# Patient Record
Sex: Female | Born: 1950 | Race: White | Hispanic: No | Marital: Married | Smoking: Never smoker
Health system: Southern US, Community
[De-identification: ages and names within clinical notes are randomized; demographics above are authoritative.]

## PROBLEM LIST (undated history)

## (undated) DIAGNOSIS — R51 Headache: Secondary | ICD-10-CM

## (undated) DIAGNOSIS — K5730 Diverticulosis of large intestine without perforation or abscess without bleeding: Secondary | ICD-10-CM

## (undated) DIAGNOSIS — F329 Major depressive disorder, single episode, unspecified: Secondary | ICD-10-CM

## (undated) DIAGNOSIS — R109 Unspecified abdominal pain: Secondary | ICD-10-CM

## (undated) HISTORY — PX: COLONOSCOPY: SHX174

## (undated) HISTORY — PX: WISDOM TOOTH EXTRACTION: SHX21

## (undated) HISTORY — DX: Diverticulosis of large intestine without perforation or abscess without bleeding: K57.30

## (undated) HISTORY — PX: SPINE SURGERY: SHX786

## (undated) HISTORY — PX: EYE SURGERY: SHX253

## (undated) HISTORY — DX: Major depressive disorder, single episode, unspecified: F32.9

## (undated) HISTORY — DX: Unspecified abdominal pain: R10.9

## (undated) HISTORY — DX: Headache: R51

---

## 2002-09-01 ENCOUNTER — Other Ambulatory Visit: Admission: RE | Admit: 2002-09-01 | Discharge: 2002-09-01 | Payer: Self-pay | Admitting: Internal Medicine

## 2003-02-16 ENCOUNTER — Encounter: Payer: Self-pay | Admitting: Internal Medicine

## 2003-02-16 ENCOUNTER — Encounter: Admission: RE | Admit: 2003-02-16 | Discharge: 2003-02-16 | Payer: Self-pay | Admitting: Internal Medicine

## 2004-05-31 ENCOUNTER — Ambulatory Visit (HOSPITAL_COMMUNITY): Admission: RE | Admit: 2004-05-31 | Discharge: 2004-05-31 | Payer: Self-pay | Admitting: Internal Medicine

## 2004-11-28 ENCOUNTER — Ambulatory Visit: Payer: Self-pay | Admitting: Internal Medicine

## 2005-04-30 ENCOUNTER — Ambulatory Visit: Payer: Self-pay | Admitting: Internal Medicine

## 2005-05-22 ENCOUNTER — Ambulatory Visit: Payer: Self-pay | Admitting: Gastroenterology

## 2005-06-13 ENCOUNTER — Ambulatory Visit: Payer: Self-pay | Admitting: Gastroenterology

## 2005-06-13 ENCOUNTER — Encounter (INDEPENDENT_AMBULATORY_CARE_PROVIDER_SITE_OTHER): Payer: Self-pay | Admitting: Specialist

## 2006-02-19 ENCOUNTER — Ambulatory Visit (HOSPITAL_COMMUNITY): Admission: RE | Admit: 2006-02-19 | Discharge: 2006-02-19 | Payer: Self-pay | Admitting: Internal Medicine

## 2006-08-05 ENCOUNTER — Ambulatory Visit: Payer: Self-pay | Admitting: Internal Medicine

## 2007-01-29 ENCOUNTER — Ambulatory Visit: Payer: Self-pay | Admitting: Internal Medicine

## 2007-01-29 LAB — CONVERTED CEMR LAB
ALT: 11 units/L (ref 0–40)
AST: 18 units/L (ref 0–37)
Albumin: 4.2 g/dL (ref 3.5–5.2)
Alkaline Phosphatase: 80 units/L (ref 39–117)
BUN: 12 mg/dL (ref 6–23)
Basophils Absolute: 0 10*3/uL (ref 0.0–0.1)
Basophils Relative: 0.5 % (ref 0.0–1.0)
Bilirubin, Direct: 0.2 mg/dL (ref 0.0–0.3)
CO2: 33 meq/L — ABNORMAL HIGH (ref 19–32)
Calcium: 9.5 mg/dL (ref 8.4–10.5)
Chloride: 109 meq/L (ref 96–112)
Cholesterol: 143 mg/dL (ref 0–200)
Creatinine, Ser: 0.7 mg/dL (ref 0.4–1.2)
Eosinophils Absolute: 0.1 10*3/uL (ref 0.0–0.6)
Eosinophils Relative: 3.3 % (ref 0.0–5.0)
GFR calc Af Amer: 112 mL/min
GFR calc non Af Amer: 93 mL/min
Glucose, Bld: 107 mg/dL — ABNORMAL HIGH (ref 70–99)
HCT: 38.4 % (ref 36.0–46.0)
HDL: 57.2 mg/dL (ref >39.0)
Hemoglobin: 13.8 g/dL (ref 12.0–15.0)
LDL Cholesterol: 72 mg/dL (ref 0–99)
Lymphocytes Relative: 29.9 % (ref 12.0–46.0)
MCHC: 35.8 g/dL (ref 30.0–36.0)
MCV: 87 fL (ref 78.0–100.0)
Monocytes Absolute: 0.3 10*3/uL (ref 0.2–0.7)
Monocytes Relative: 7.3 % (ref 3.0–11.0)
Neutro Abs: 2.7 10*3/uL (ref 1.4–7.7)
Neutrophils Relative %: 59 % (ref 43.0–77.0)
Platelets: 277 10*3/uL (ref 150–400)
Potassium: 4.1 meq/L (ref 3.5–5.1)
RBC: 4.42 M/uL (ref 3.87–5.11)
RDW: 12.6 % (ref 11.5–14.6)
Sodium: 147 meq/L — ABNORMAL HIGH (ref 135–145)
TSH: 1.68 microintl units/mL (ref 0.35–5.50)
Total Bilirubin: 1.3 mg/dL — ABNORMAL HIGH (ref 0.3–1.2)
Total CHOL/HDL Ratio: 2.5
Total Protein: 6.8 g/dL (ref 6.0–8.3)
Triglycerides: 71 mg/dL (ref 0–149)
VLDL: 14 mg/dL (ref 0–40)
WBC: 4.4 10*3/uL — ABNORMAL LOW (ref 4.5–10.5)

## 2007-02-05 ENCOUNTER — Encounter: Payer: Self-pay | Admitting: Internal Medicine

## 2007-02-05 ENCOUNTER — Other Ambulatory Visit: Admission: RE | Admit: 2007-02-05 | Discharge: 2007-02-05 | Payer: Self-pay | Admitting: Internal Medicine

## 2007-02-05 ENCOUNTER — Ambulatory Visit: Payer: Self-pay | Admitting: Internal Medicine

## 2007-07-21 ENCOUNTER — Ambulatory Visit: Payer: Self-pay | Admitting: Internal Medicine

## 2007-07-21 DIAGNOSIS — J209 Acute bronchitis, unspecified: Secondary | ICD-10-CM | POA: Insufficient documentation

## 2007-07-21 DIAGNOSIS — F329 Major depressive disorder, single episode, unspecified: Secondary | ICD-10-CM

## 2007-07-21 DIAGNOSIS — F3289 Other specified depressive episodes: Secondary | ICD-10-CM

## 2007-07-21 HISTORY — DX: Major depressive disorder, single episode, unspecified: F32.9

## 2007-07-21 HISTORY — DX: Other specified depressive episodes: F32.89

## 2007-10-03 ENCOUNTER — Telehealth (INDEPENDENT_AMBULATORY_CARE_PROVIDER_SITE_OTHER): Payer: Self-pay | Admitting: *Deleted

## 2007-10-14 ENCOUNTER — Telehealth: Payer: Self-pay | Admitting: Internal Medicine

## 2007-10-14 ENCOUNTER — Encounter: Payer: Self-pay | Admitting: Internal Medicine

## 2008-02-26 ENCOUNTER — Telehealth: Payer: Self-pay | Admitting: Internal Medicine

## 2008-04-08 ENCOUNTER — Ambulatory Visit (HOSPITAL_COMMUNITY): Admission: RE | Admit: 2008-04-08 | Discharge: 2008-04-08 | Payer: Self-pay | Admitting: Internal Medicine

## 2008-07-21 ENCOUNTER — Ambulatory Visit: Payer: Self-pay | Admitting: Internal Medicine

## 2008-07-21 LAB — CONVERTED CEMR LAB
ALT: 13 units/L (ref 0–35)
AST: 20 units/L (ref 0–37)
Albumin: 4.4 g/dL (ref 3.5–5.2)
Alkaline Phosphatase: 77 units/L (ref 39–117)
BUN: 10 mg/dL (ref 6–23)
Basophils Absolute: 0 10*3/uL (ref 0.0–0.1)
Basophils Relative: 1.1 % (ref 0.0–3.0)
Bilirubin Urine: NEGATIVE
Bilirubin, Direct: 0.1 mg/dL (ref 0.0–0.3)
Blood in Urine, dipstick: NEGATIVE
CO2: 31 meq/L (ref 19–32)
Calcium: 9.2 mg/dL (ref 8.4–10.5)
Chloride: 112 meq/L (ref 96–112)
Cholesterol: 103 mg/dL (ref 0–200)
Creatinine, Ser: 0.7 mg/dL (ref 0.4–1.2)
Eosinophils Absolute: 0.1 10*3/uL (ref 0.0–0.7)
Eosinophils Relative: 3.8 % (ref 0.0–5.0)
GFR calc Af Amer: 111 mL/min
GFR calc non Af Amer: 92 mL/min
Glucose, Bld: 102 mg/dL — ABNORMAL HIGH (ref 70–99)
Glucose, Urine, Semiquant: NEGATIVE
HCT: 36.7 % (ref 36.0–46.0)
HDL: 40 mg/dL (ref >39.0)
Hemoglobin: 12.6 g/dL (ref 12.0–15.0)
LDL Cholesterol: 49 mg/dL (ref 0–99)
Lymphocytes Relative: 36.1 % (ref 12.0–46.0)
MCHC: 34.4 g/dL (ref 30.0–36.0)
MCV: 91.3 fL (ref 78.0–100.0)
Monocytes Absolute: 0.3 10*3/uL (ref 0.1–1.0)
Monocytes Relative: 7.2 % (ref 3.0–12.0)
Neutro Abs: 1.9 10*3/uL (ref 1.4–7.7)
Neutrophils Relative %: 51.8 % (ref 43.0–77.0)
Nitrite: NEGATIVE
Platelets: 253 10*3/uL (ref 150–400)
Potassium: 3.7 meq/L (ref 3.5–5.1)
RBC: 4.02 M/uL (ref 3.87–5.11)
RDW: 12.1 % (ref 11.5–14.6)
Sodium: 146 meq/L — ABNORMAL HIGH (ref 135–145)
Specific Gravity, Urine: 1.02
TSH: 1.58 microintl units/mL (ref 0.35–5.50)
Total Bilirubin: 1 mg/dL (ref 0.3–1.2)
Total CHOL/HDL Ratio: 2.6
Total Protein: 6.6 g/dL (ref 6.0–8.3)
Triglycerides: 68 mg/dL (ref 0–149)
Urobilinogen, UA: 0.2
VLDL: 14 mg/dL (ref 0–40)
WBC Urine, dipstick: NEGATIVE
WBC: 3.6 10*3/uL — ABNORMAL LOW (ref 4.5–10.5)
pH: 7

## 2008-07-26 ENCOUNTER — Other Ambulatory Visit: Admission: RE | Admit: 2008-07-26 | Discharge: 2008-07-26 | Payer: Self-pay | Admitting: Internal Medicine

## 2008-07-26 ENCOUNTER — Encounter: Payer: Self-pay | Admitting: Internal Medicine

## 2008-07-26 ENCOUNTER — Ambulatory Visit: Payer: Self-pay | Admitting: Internal Medicine

## 2008-07-26 DIAGNOSIS — K5730 Diverticulosis of large intestine without perforation or abscess without bleeding: Secondary | ICD-10-CM

## 2008-07-26 DIAGNOSIS — R51 Headache: Secondary | ICD-10-CM

## 2008-07-26 DIAGNOSIS — R519 Headache, unspecified: Secondary | ICD-10-CM | POA: Insufficient documentation

## 2008-07-26 HISTORY — DX: Diverticulosis of large intestine without perforation or abscess without bleeding: K57.30

## 2008-07-26 HISTORY — DX: Headache: R51

## 2008-07-26 LAB — HM PAP SMEAR

## 2008-08-24 ENCOUNTER — Ambulatory Visit: Payer: Self-pay | Admitting: Internal Medicine

## 2008-10-04 ENCOUNTER — Ambulatory Visit: Payer: Self-pay | Admitting: Internal Medicine

## 2008-10-04 DIAGNOSIS — J069 Acute upper respiratory infection, unspecified: Secondary | ICD-10-CM | POA: Insufficient documentation

## 2008-11-25 ENCOUNTER — Telehealth (INDEPENDENT_AMBULATORY_CARE_PROVIDER_SITE_OTHER): Payer: Self-pay

## 2009-01-06 ENCOUNTER — Telehealth: Payer: Self-pay | Admitting: Internal Medicine

## 2009-01-11 ENCOUNTER — Telehealth: Payer: Self-pay | Admitting: Internal Medicine

## 2009-06-21 ENCOUNTER — Telehealth (INDEPENDENT_AMBULATORY_CARE_PROVIDER_SITE_OTHER): Payer: Self-pay | Admitting: *Deleted

## 2009-06-23 ENCOUNTER — Ambulatory Visit: Payer: Self-pay | Admitting: Internal Medicine

## 2010-04-19 ENCOUNTER — Ambulatory Visit: Payer: Self-pay | Admitting: Internal Medicine

## 2010-05-01 ENCOUNTER — Ambulatory Visit: Payer: Self-pay | Admitting: Internal Medicine

## 2010-05-01 DIAGNOSIS — R109 Unspecified abdominal pain: Secondary | ICD-10-CM

## 2010-05-01 DIAGNOSIS — R1011 Right upper quadrant pain: Secondary | ICD-10-CM | POA: Insufficient documentation

## 2010-05-01 HISTORY — DX: Unspecified abdominal pain: R10.9

## 2010-05-05 ENCOUNTER — Ambulatory Visit (HOSPITAL_COMMUNITY): Admission: RE | Admit: 2010-05-05 | Discharge: 2010-05-05 | Payer: Self-pay | Admitting: Internal Medicine

## 2010-05-05 LAB — HM MAMMOGRAPHY: HM Mammogram: NEGATIVE

## 2010-05-08 ENCOUNTER — Telehealth: Payer: Self-pay | Admitting: Internal Medicine

## 2010-08-03 ENCOUNTER — Encounter: Payer: Self-pay | Admitting: Internal Medicine

## 2010-12-03 ENCOUNTER — Encounter: Payer: Self-pay | Admitting: Internal Medicine

## 2010-12-12 NOTE — Progress Notes (Signed)
Summary: refill  Phone Note Call from Patient Call back at Home Phone [phone]   Caller: pt live Call For: K  Summary of Call: acyclovir 200 mg [facility] # [phone] Initial call taken by: Roselle Locus,  November 25, 2008 9:09 AM  Follow-up for Phone Call        attempted to call [facility] x 3 hrs- constantly busy. Called pt- LMOM to call back if she desires [place] to call another pharmacy. Follow-up by: Raechel Ache, RN,  November 26, 2008 4:52 PM

## 2010-12-12 NOTE — Assessment & Plan Note (Signed)
Summary: CONGESTION/CCM  Medications Added LEXAPRO 10 MG TABS (ESCITALOPRAM OXALATE)  ACYCLOVIR 200 MG  CAPS (ACYCLOVIR) Take as dir as needed      Allergies Added: SULFAMETHOXAZOLE (SULFAMETHOXAZOLE)  Vital Signs:  Patient Profile:   60 Years Old Female Weight:      164 pounds Temp:     98.4 degrees F oral BP sitting:   126 / 84  (left arm)  Vitals Entered By: Raechel Ache, RN (July 21, 2007 8:34 AM)             Is Patient Diabetic? No     Chief Complaint:  C/o sinus drainage and scratchy throat. Had cold a month ago. Also worried about dementia- family hx..  History of Present Illness: 60year-old female presents with a complaint of persistent postnasal drip and drainage and cough. Her main concern is forgetfulness.  She states that both her mother and grandmother have a history of dementia. At times.  She is occasionally forgetful, but really no short-term memory issues at all.  Dementia discussed in detail  Current Allergies: SULFAMETHOXAZOLE (SULFAMETHOXAZOLE)  Past Medical History:    Depression  Past Surgical History:    wisdom teeth, extraction   Family History:    father history of coronary artery disease, prior MI    mother, hypertension, diabetes, obesity, history of dementia  Social History:    Married   Risk Factors:  Tobacco use:  never   Review of Systems  The patient denies anorexia, fever, weight loss, vision loss, decreased hearing, hoarseness, chest pain, syncope, dyspnea on exhertion, peripheral edema, prolonged cough, hemoptysis, abdominal pain, melena, hematochezia, severe indigestion/heartburn, hematuria, incontinence, genital sores, muscle weakness, suspicious skin lesions, transient blindness, difficulty walking, depression, unusual weight change, abnormal bleeding, enlarged lymph nodes, angioedema, breast masses, and testicular masses.     Physical Exam  General:     Well-developed,well-nourished,in no acute distress;  alert,appropriate and cooperative throughout examination Head:     Normocephalic and atraumatic without obvious abnormalities. No apparent alopecia or balding. Eyes:     No corneal or conjunctival inflammation noted. EOMI. Perrla. Funduscopic exam benign, without hemorrhages, exudates or papilledema. Vision grossly normal. Ears:     External ear exam shows no significant lesions or deformities.  Otoscopic examination reveals clear canals, tympanic membranes are intact bilaterally without bulging, retraction, inflammation or discharge. Hearing is grossly normal bilaterally. Mouth:     Oral mucosa and oropharynx without lesions or exudates.  Teeth in good repair. Neck:     No deformities, masses, or tenderness noted. Lungs:     Normal respiratory effort, chest expands symmetrically. Lungs are clear to auscultation, no crackles or wheezes. Heart:     Normal rate and regular rhythm. S1 and S2 normal without gallop, murmur, click, rub or other extra sounds.    Impression & Recommendations:  Problem # 1:  DEPRESSION (ICD-311)  Her updated medication list for this problem includes:    Lexapro 10 Mg Tabs (Escitalopram oxalate)   Problem # 2:  BRONCHITIS, VIRAL, ACUTE (ICD-466.0) will treat symptomatically with xyzal and Nasonex  Complete Medication List: 1)  Lexapro 10 Mg Tabs (Escitalopram oxalate) 2)  Acyclovir 200 Mg Caps (Acyclovir) .... Take as dir as needed   Patient Instructions: 1)  return office visit in 6 months 2)  It is important that you exercise regularly at least 20 minutes 5 times a week. If you develop chest pain, have severe difficulty breathing, or feel very tired , stop exercising immediately and  seek medical attention.    Prescriptions: ACYCLOVIR 200 MG  CAPS (ACYCLOVIR) Take as dir as needed  #30 x 6   Entered and Authorized by:   Gordy Savers  MD   Signed by:   Gordy Savers  MD on 07/21/2007   Method used:   Print then Give to Patient   RxID:    [id number] LEXAPRO 10 MG TABS (ESCITALOPRAM OXALATE)   #90 x 6   Entered and Authorized by:   Gordy Savers  MD   Signed by:   Gordy Savers  MD on 07/21/2007   Method used:   Print then Give to Patient   RxID:   [id number]

## 2010-12-12 NOTE — Assessment & Plan Note (Signed)
Summary: flu shot/njr   Nurse Visit Flu Vaccine Consent Questions     Do you have a history of severe allergic reactions to this vaccine? no    Any prior history of allergic reactions to egg and/or gelatin? no    Do you have a sensitivity to the preservative Thimersol? no    Do you have a past history of Guillan-Barre Syndrome? no    Do you currently have an acute febrile illness? no    Have you ever had a severe reaction to latex? no    Vaccine information given and explained to patient? yes    Are you currently pregnant? no    Lot Number:[id number]   Site Given  Left Deltoid IM Romualdo Bolk, CMA  August 24, 2008 1:13 PM    Prior Medications: ACYCLOVIR 200 MG  CAPS (ACYCLOVIR) Take as dir as needed CITALOPRAM HYDROBROMIDE 20 MG TABS (CITALOPRAM HYDROBROMIDE) one daily Current Allergies: SULFAMETHOXAZOLE (SULFAMETHOXAZOLE)    Orders Added: 1)  Admin 1st Vaccine [90471] 2)  Flu Vaccine 71yrs + [place].Dew    ]

## 2010-12-12 NOTE — Progress Notes (Signed)
Summary: NEW RX  Phone Note Call from Patient Call back at [phone]   Caller: PT LIVE Call For: K Summary of Call: PATIENT NEEDS A NEW RX FOR ACYCLOVIR 200MG  [facility] Initial call taken by: Celine Ahr,  January 06, 2009 10:41 AM  Follow-up for Phone Call        Rx Called In Follow-up by: Raechel Ache, RN,  January 06, 2009 10:44 AM      Prescriptions: ACYCLOVIR 200 MG  CAPS (ACYCLOVIR) Take as dir as needed  #60 x 6   Entered by:   Raechel Ache, RN   Authorized by:   Gordy Savers  MD   Signed by:   Raechel Ache, RN on 01/06/2009   Method used:   [facility] to        [facility] [phone]* (retail)       [address]       [place], [place]  [address]       Ph: [phone] or [phone]       Fax: [phone]   RxID:   [id number]

## 2010-12-12 NOTE — Assessment & Plan Note (Signed)
Summary: LOWER AB PAIN/LOSS OF APPETITE/CJR   Vital Signs:  Patient profile:   60 year old female Weight:      151 pounds Temp:     98.7 degrees F oral BP sitting:   130 / 90  (left arm) Cuff size:   regular  Vitals Entered By: Kathrynn Speed CMA (May 01, 2010 4:15 PM) CC: lower ab pain / loss of appetite   CC:  lower ab pain / loss of appetite.  History of Present Illness: 60 year old patient who is seen today complaining of lower abdominal discomfort.  Associated symptoms include loss of appetite.  There is been no nausea, vomiting, or change in her bowel habits.  There is been no associated fever.  She states his symptoms began on June 2.  She has eliminated and lactose in her diet, which had no effect on the pain.  She states that she usually wakes in the morning feels quite well.  Pain usually occurs following lunch.  Her appetite is well maintained.  She describes a crampy achiness in all lower abdominal quadrants. She has a history of depression and has recently evaluated.  Current Medications (verified): 1)  Acyclovir 200 Mg  Caps (Acyclovir) .... Take As Dir As Needed 2)  Lexapro 20 Mg Tabs (Escitalopram Oxalate) .... One Half Tablet Every Morning.  Allergies (verified): 1)  Sulfamethoxazole (Sulfamethoxazole)  Past History:  Past Medical History: Reviewed history from 07/26/2008 and no changes required. Depression Diverticulosis, colon Headache  Review of Systems       The patient complains of abdominal pain.  The patient denies anorexia, fever, weight loss, weight gain, vision loss, decreased hearing, hoarseness, chest pain, syncope, dyspnea on exertion, peripheral edema, prolonged cough, headaches, hemoptysis, melena, hematochezia, severe indigestion/heartburn, hematuria, incontinence, genital sores, muscle weakness, suspicious skin lesions, transient blindness, difficulty walking, depression, unusual weight change, abnormal bleeding, enlarged lymph nodes,  angioedema, and breast masses.    Physical Exam  General:  Well-developed,well-nourished,in no acute distress; alert,appropriate and cooperative throughout examination Head:  Normocephalic and atraumatic without obvious abnormalities. No apparent alopecia or balding. Mouth:  Oral mucosa and oropharynx without lesions or exudates.  Teeth in good repair. Neck:  No deformities, masses, or tenderness noted. Lungs:  Normal respiratory effort, chest expands symmetrically. Lungs are clear to auscultation, no crackles or wheezes. Heart:  Normal rate and regular rhythm. S1 and S2 normal without gallop, murmur, click, rub or other extra sounds. Abdomen:  Bowel sounds positive,abdomen soft and non-tender without masses, organomegaly or hernias noted.   Impression & Recommendations:  Problem # 1:  DEPRESSION (ICD-311)  Her updated medication list for this problem includes:    Lexapro 20 Mg Tabs (Escitalopram oxalate) ..... One half tablet every morning.  Problem # 2:  ABDOMINAL PAIN (ICD-789.00) suspect functional.  Will increase her fiber in her diet and try anti-spasmodics.  Will call if unimproved  Complete Medication List: 1)  Acyclovir 200 Mg Caps (Acyclovir) .... Take as dir as needed 2)  Lexapro 20 Mg Tabs (Escitalopram oxalate) .... One half tablet every morning. 3)  Hyomax-sl 0.125 Mg Subl (Hyoscyamine sulfate) .... One sublingually every 4 hours  as needed for pain  Patient Instructions: 1)  high fiber diet 2)  use medicines as directed 3)  call if worsens Prescriptions: HYOMAX-SL 0.125 MG SUBL (HYOSCYAMINE SULFATE) one sublingually every 4 hours  as needed for pain  #50 x 4   Entered and Authorized by:   Gordy Savers  MD   Signed  by:   Gordy Savers  MD on 05/01/2010   Method used:   [facility] to        [facility] [phone]* (retail)       [address]       [place], [place]  [address]       Ph: [phone] or [phone]       Fax: [phone]   RxID:    [id number]

## 2010-12-12 NOTE — Progress Notes (Signed)
Summary: DOES SHE NEED ANTIBIOTIC  Phone Note Call from Patient Call back at Home Phone [phone]   Caller: PATIENT (TRIAGE MESSAGE) Call For: K Summary of Call: [facility] [address]  HAD A COLD IT IS IN HER CHEST  GOT A NETTI POT AND MUCINEX  MUCUS IS YELLOW GREENISH  DOES SHE NEED AN ANTIBIOTIC   Initial call taken by: Roselle Locus,  October 03, 2007 11:56 AM  Follow-up for Phone Call        Offered OV, pt declined, "thinks she is doing better, really felt bad on Wed."  Pt does not feel she has a fever, we reviewed her sx and she plans to cont with Mucinex, will begin to humidify home and increase her fluids, which she had not done.  She will call back on Monday if sx do not cont to improve. Follow-up by: Sid Falcon LPN,  October 03, 2007 12:22 PM

## 2010-12-12 NOTE — Progress Notes (Signed)
Summary: refill  Phone Note Call from Patient Call back at Home Phone [phone]   Caller: pt live Call For: K Summary of Call: Lexapro 10 mg acyclovir 200 mg  She wants to pick up written 90 day rx.  She plans to mail them in  Initial call taken by: Roselle Locus,  February 26, 2008 3:59 PM      Prescriptions: ACYCLOVIR 200 MG  CAPS (ACYCLOVIR) Take as dir as needed  #30 x 6   Entered and Authorized by:   Gordy Savers  MD   Signed by:   Gordy Savers  MD on 02/26/2008   Method used:   Print then Give to Patient   RxID:   [id number] LEXAPRO 10 MG TABS (ESCITALOPRAM OXALATE) 1 once daily  #90 x 6   Entered and Authorized by:   Gordy Savers  MD   Signed by:   Gordy Savers  MD on 02/26/2008   Method used:   Print then Give to Patient   RxID:   [id number]     Appended Document: refill called for pick-up

## 2010-12-12 NOTE — Miscellaneous (Signed)
Summary: Flu Shot/[facility]  Flu Shot/[facility]   Imported By: Maryln Gottron 08/08/2010 13:19:22  _____________________________________________________________________  External Attachment:    Type:   Image     Comment:   External Document

## 2010-12-12 NOTE — Progress Notes (Signed)
Summary: Needs lexapro refill  Phone Note Call from Patient   Summary of Call: Patient needs refill for Lexapro sent to [facility]. Patient wants to know if she needs to make an appointment to get this medication renewed. Patient can be reached at [phone]. Patient went ahead and scheduled an appointment but will need refills sent to pharmacy/[facility]. Initial call taken by: Darra Lis RMA,  June 21, 2009 10:05 AM  Follow-up for Phone Call        [facility]- record says she's on Celexa. Follow-up by: Raechel Ache, RN,  June 21, 2009 10:18 AM  Additional Follow-up for Phone Call Additional follow up Details #1::        okay to refill whatever drugs.  She is presently taking Additional Follow-up by: Gordy Savers  MD,  June 21, 2009 10:49 AM    Additional Follow-up for Phone Call Additional follow up Details #2::    Patient confirms that she is taking Lexapro 10mg  one tablet daily. Patient states she never got the Celexa filled. Follow-up by: Darra Lis RMA,  June 21, 2009 10:57 AM  New/Updated Medications: LEXAPRO 10 MG TABS (ESCITALOPRAM OXALATE) Take 1 tablet by mouth once a day Prescriptions: LEXAPRO 10 MG TABS (ESCITALOPRAM OXALATE) Take 1 tablet by mouth once a day  #90 x 3   Entered by:   Darra Lis RMA   Authorized by:   Gordy Savers  MD   Signed by:   Darra Lis RMA on 06/21/2009   Method used:   Faxed to ...       [facility] (mail-order)             , [place]         Ph:        Fax: [phone]   RxID:   [id number]

## 2010-12-12 NOTE — Assessment & Plan Note (Signed)
Summary: cpx/mhf   Vital Signs:  Patient Profile:   60 Years Old Female Height:     66 inches Weight:      166 pounds Temp:     98.2 degrees F BP sitting:   112 / 70  Vitals Entered By: Sindy Guadeloupe RN (July 26, 2008 1:23 PM)                 Chief Complaint:  cpx with pap.  History of Present Illness: 60 year old patient seen today for a health maintenance  exam ; she has a long history depression, which has been stable . No other concerns or complaints.  She has a history of diverticulosis and did have a colonoscopy in 2006.  Her last mammogram was in the spring of this year    Current Allergies (reviewed today): SULFAMETHOXAZOLE (SULFAMETHOXAZOLE)  Past Medical History:    Reviewed history from 07/21/2007 and no changes required:       Depression       Diverticulosis, colon       Headache  Past Surgical History:    Reviewed history from 07/21/2007 and no changes required:       wisdom teeth, extraction   Family History:    Reviewed history from 07/21/2007 and no changes required:       father history of coronary artery disease, prior MI       mother, hypertension, diabetes, obesity, history of dementia  Social History:    Reviewed history from 07/21/2007 and no changes required:       Married       no children    Review of Systems  The patient denies anorexia, fever, weight loss, weight gain, vision loss, decreased hearing, hoarseness, chest pain, syncope, dyspnea on exertion, peripheral edema, prolonged cough, headaches, hemoptysis, abdominal pain, melena, hematochezia, severe indigestion/heartburn, hematuria, incontinence, genital sores, muscle weakness, suspicious skin lesions, transient blindness, difficulty walking, depression, unusual weight change, abnormal bleeding, enlarged lymph nodes, angioedema, and breast masses.     Physical Exam  General:     overweight-appearing.  low-normal blood pressure Head:     Normocephalic and atraumatic  without obvious abnormalities. No apparent alopecia or balding. Eyes:     No corneal or conjunctival inflammation noted. EOMI. Perrla. Funduscopic exam benign, without hemorrhages, exudates or papilledema. Vision grossly normal. Ears:     External ear exam shows no significant lesions or deformities.  Otoscopic examination reveals clear canals, tympanic membranes are intact bilaterally without bulging, retraction, inflammation or discharge. Hearing is grossly normal bilaterally. Mouth:     Oral mucosa and oropharynx without lesions or exudates.  Teeth in good repair. Neck:     No deformities, masses, or tenderness noted. Chest Wall:     No deformities, masses, or tenderness noted. Breasts:     No mass, nodules, thickening, tenderness, bulging, retraction, inflamation, nipple discharge or skin changes noted.   Lungs:     Normal respiratory effort, chest expands symmetrically. Lungs are clear to auscultation, no crackles or wheezes. Heart:     Normal rate and regular rhythm. S1 and S2 normal without gallop, murmur, click, rub or other extra sounds. Abdomen:     Bowel sounds positive,abdomen soft and non-tender without masses, organomegaly or hernias noted. Rectal:     No external abnormalities noted. Normal sphincter tone. No rectal masses or tenderness. Genitalia:     Normal introitus for age, no external lesions, no vaginal discharge, mucosa pink and moist, no vaginal or cervical lesions, no  vaginal atrophy, no friaility or hemorrhage, normal uterus size and position, no adnexal masses or tenderness Msk:     No deformity or scoliosis noted of thoracic or lumbar spine.   Pulses:     R and L carotid,radial,femoral,dorsalis pedis and posterior tibial pulses are full and equal bilaterally Extremities:     No clubbing, cyanosis, edema, or deformity noted with normal full range of motion of all joints.   Neurologic:     No cranial nerve deficits noted. Station and gait are normal. Plantar  reflexes are down-going bilaterally. DTRs are symmetrical throughout. Sensory, motor and coordinative functions appear intact. Skin:     Intact without suspicious lesions or rashes Cervical Nodes:     No lymphadenopathy noted Axillary Nodes:     No palpable lymphadenopathy Inguinal Nodes:     No significant adenopathy Psych:     Cognition and judgment appear intact. Alert and cooperative with normal attention span and concentration. No apparent delusions, illusions, hallucinations    Impression & Recommendations:  Problem # 1:  DIVERTICULOSIS, COLON (ICD-562.10)  Problem # 2:  Preventive Health Care (ICD-V70.0)  Complete Medication List: 1)  Acyclovir 200 Mg Caps (Acyclovir) .... Take as dir as needed 2)  Citalopram Hydrobromide 20 Mg Tabs (Citalopram hydrobromide) .... One daily   Patient Instructions: 1)  Please schedule a follow-up appointment in 1 year. 2)  It is important that you exercise regularly at least 20 minutes 5 times a week. If you develop chest pain, have severe difficulty breathing, or feel very tired , stop exercising immediately and seek medical attention. 3)  Schedule your mammogram.   Prescriptions: CITALOPRAM HYDROBROMIDE 20 MG TABS (CITALOPRAM HYDROBROMIDE) one daily  #90 x 6   Entered and Authorized by:   Gordy Savers  MD   Signed by:   Gordy Savers  MD on 07/26/2008   Method used:   Print then Give to Patient   RxID:   [id number] ACYCLOVIR 200 MG  CAPS (ACYCLOVIR) Take as dir as needed  #60 x 6   Entered and Authorized by:   Gordy Savers  MD   Signed by:   Gordy Savers  MD on 07/26/2008   Method used:   Print then Give to Patient   RxID:   [id number]  ]

## 2010-12-12 NOTE — Assessment & Plan Note (Signed)
Summary: med check//ccm   Vital Signs:  Patient profile:   60 year old female Weight:      151 pounds Temp:     98.2 degrees F oral BP sitting:   120 / 80  (right arm) Cuff size:   regular  Vitals Entered By: Duard Brady LPN (April 19, 1609 11:01 AM) CC: medication review and refills Is Patient Diabetic? No   CC:  medication review and refills.  History of Present Illness: 59 year old patient who is seen today for follow-up for depression.  Last fall.  She gave herself a trial off Lexapro but had worsening of depression.  She has also failed a prior trial.  She is doing quite well.  Today, however, without concerns or complaints.she has requested a change in her prescription to take one half of a 20-mg tablet daily for cost considerations.  A new prescription was dispensed  Allergies: 1)  Sulfamethoxazole (Sulfamethoxazole)  Past History:  Past Medical History: Reviewed history from 07/26/2008 and no changes required. Depression Diverticulosis, colon Headache  Physical Exam  General:  Well-developed,well-nourished,in no acute distress; alert,appropriate and cooperative throughout examination Head:  Normocephalic and atraumatic without obvious abnormalities. No apparent alopecia or balding. Mouth:  Oral mucosa and oropharynx without lesions or exudates.  Teeth in good repair. Neck:  No deformities, masses, or tenderness noted. Lungs:  Normal respiratory effort, chest expands symmetrically. Lungs are clear to auscultation, no crackles or wheezes. Heart:  Normal rate and regular rhythm. S1 and S2 normal without gallop, murmur, click, rub or other extra sounds. Abdomen:  Bowel sounds positive,abdomen soft and non-tender without masses, organomegaly or hernias noted.   Impression & Recommendations:  Problem # 1:  DEPRESSION (ICD-311)  The following medications were removed from the medication list:    Citalopram Hydrobromide 20 Mg Tabs (Citalopram hydrobromide) .....  One daily    Lexapro 10 Mg Tabs (Escitalopram oxalate) .Marland Kitchen... Take 1 tablet by mouth once a day Her updated medication list for this problem includes:    Lexapro 20 Mg Tabs (Escitalopram oxalate) ..... One half tablet every morning.  The following medications were removed from the medication list:    Citalopram Hydrobromide 20 Mg Tabs (Citalopram hydrobromide) ..... One daily    Lexapro 10 Mg Tabs (Escitalopram oxalate) .Marland Kitchen... Take 1 tablet by mouth once a day Her updated medication list for this problem includes:    Lexapro 20 Mg Tabs (Escitalopram oxalate) ..... One half tablet every morning.  Complete Medication List: 1)  Acyclovir 200 Mg Caps (Acyclovir) .... Take as dir as needed 2)  Lexapro 20 Mg Tabs (Escitalopram oxalate) .... One half tablet every morning.  Patient Instructions: 1)  Please schedule a follow-up appointment in 6 months. 2)  It is important that you exercise regularly at least 20 minutes 5 times a week. If you develop chest pain, have severe difficulty breathing, or feel very tired , stop exercising immediately and seek medical attention. 3)  Take calcium +Vitamin D daily. Prescriptions: LEXAPRO 20 MG TABS (ESCITALOPRAM OXALATE) one half tablet every morning.  #90 x 6   Entered and Authorized by:   Gordy Savers  MD   Signed by:   Gordy Savers  MD on 04/19/2010   Method used:   Electronically to        [facility]* (mail-order)             ,          Ph: [phone]  Fax: [phone]   RxID:   [id number] ACYCLOVIR 200 MG  CAPS (ACYCLOVIR) Take as dir as needed  #60 x 6   Entered and Authorized by:   Gordy Savers  MD   Signed by:   Gordy Savers  MD on 04/19/2010   Method used:   [facility]        [facility] [phone][facility] (retail)       [address]       [place], [place]  [address]       Ph: [phone] or [phone]       Fax: [phone]   RxID:   [id number]

## 2010-12-12 NOTE — Progress Notes (Signed)
Summary: Pt says Hyomax is helping some, but still having same symptoms  Phone Note Call from Patient Call back at Home Phone [phone]   Caller: Patient Summary of Call: Pt called and said that Hyomax is helping some, but she still has ache and loss of appetite. Wondering how long med usually takes to completely relieve symptoms? Pt didnt know if maybe some of the problems she is experiencing is steming from losing her father a few months ago. Is there a diff med, or should she give med another week?  Initial call taken by: Lucy Antigua,  May 08, 2010 9:03 AM  Follow-up for Phone Call        agree that stress/grief likely playing a role; continue same but call in new Rx for alprazolam 0.5  #50 one two times a day as needed for pain or anxiety Follow-up by: Gordy Savers  MD,  May 08, 2010 12:52 PM  Additional Follow-up for Phone Call Additional follow up Details #1::        called pt - discussed med and new on to be called in.   med list change and [facility] KIK Additional Follow-up by: Duard Brady LPN,  May 08, 2010 3:49 PM    New/Updated Medications: ALPRAZOLAM 0.5 MG TABS (ALPRAZOLAM) 1 by mouth two times a day as needed pain or anxiety Prescriptions: ALPRAZOLAM 0.5 MG TABS (ALPRAZOLAM) 1 by mouth two times a day as needed pain or anxiety  #50 x 0   Entered by:   Duard Brady LPN   Authorized by:   Gordy Savers  MD   Signed by:   Duard Brady LPN on 09/81/1914   Method used:   Historical   RxID:   [id number]

## 2010-12-12 NOTE — Miscellaneous (Signed)
  Clinical Lists Changes  Medications: Changed medication from LEXAPRO 10 MG TABS (ESCITALOPRAM OXALATE) to LEXAPRO 10 MG TABS (ESCITALOPRAM OXALATE) 1 once daily

## 2010-12-12 NOTE — Progress Notes (Signed)
Summary: still has cold/cough  Phone Note Call from Patient Call back at Home Phone [phone]   Caller: patient triage message Call For: k Summary of Call: [facility] [address] [facility] a week or so ago about having a cold still has the cold no fever has had the cold since at least the 15th of November  still has congestion in chest and head  can you call in something  Initial call taken by: Roselle Locus,  October 14, 2007 3:39 PM  Follow-up for Phone Call        Called pt back and asked her if the samples of Zyxal and Nasonex were helpful from OV on 07/21/07.  Pt said yes, however she ran out.  Pt states she forgot what was given to her at that OV.  Pt c/o ongoing cold and cough sx.  Denies fever, drainage is clear from nasal passages, no eye drainage or tearing, denies SOB. [facility] [address] Follow-up by: Sid Falcon LPN,  October 14, 2007 4:10 PM  Additional Follow-up for Phone Call Additional follow up Details #1::        Duratuss AC 12 4 oz  one- two  tsp twice daily per Dr Amador Cunas.    Rx called in, pt informed Additional Follow-up by: Sid Falcon LPN,  October 14, 2007 5:10 PM

## 2010-12-12 NOTE — Miscellaneous (Signed)
Summary: flu vaccine   Clinical Lists Changes  Observations: Added new observation of FLU VAX: Historical (08/03/2010 17:01)      Immunization History:  Influenza Immunization History:    Influenza:  Historical (08/03/2010) given at [facility]. K[facility]

## 2010-12-12 NOTE — Assessment & Plan Note (Signed)
Summary: sinus infection?/dm   Vital Signs:  Patient Profile:   60 Years Old Female Height:     66 inches Weight:      166 pounds Temp:     98.5 degrees F oral BP sitting:   106 / 86  (left arm) Cuff size:   regular  Vitals Entered By: Raechel Ache, RN (October 04, 2008 2:50 PM)                 Chief Complaint:  C/o headcold since last week; mucus green over weekend..  History of Present Illness: 60 year old female with a one week history of head and chest congestion.  She is about to occur, sinus drainage, and productive cough, which is thicker and greener, but still feels well and has been no fever.  Denies any sinus pain, shortness of breath, or any wheezing.  She is on Lexapro for her depression, which has been stable    Current Allergies: SULFAMETHOXAZOLE (SULFAMETHOXAZOLE)     Review of Systems  The patient denies anorexia, fever, weight loss, weight gain, vision loss, decreased hearing, hoarseness, chest pain, syncope, dyspnea on exertion, peripheral edema, prolonged cough, headaches, hemoptysis, abdominal pain, melena, hematochezia, severe indigestion/heartburn, hematuria, incontinence, genital sores, muscle weakness, suspicious skin lesions, transient blindness, difficulty walking, depression, unusual weight change, abnormal bleeding, enlarged lymph nodes, angioedema, and breast masses.     Physical Exam  General:     Well-developed,well-nourished,in no acute distress; alert,appropriate and cooperative throughout examination Head:     Normocephalic and atraumatic without obvious abnormalities. No apparent alopecia or balding. Eyes:     No corneal or conjunctival inflammation noted. EOMI. Perrla. Funduscopic exam benign, without hemorrhages, exudates or papilledema. Vision grossly normal. Ears:     External ear exam shows no significant lesions or deformities.  Otoscopic examination reveals clear canals, tympanic membranes are intact bilaterally without  bulging, retraction, inflammation or discharge. Hearing is grossly normal bilaterally. Nose:     External nasal examination shows no deformity or inflammation. Nasal mucosa are pink and moist without lesions or exudates. Mouth:     Oral mucosa and oropharynx without lesions or exudates.  Teeth in good repair. Neck:     No deformities, masses, or tenderness noted. Lungs:     Normal respiratory effort, chest expands symmetrically. Lungs are clear to auscultation, no crackles or wheezes. Heart:     Normal rate and regular rhythm. S1 and S2 normal without gallop, murmur, click, rub or other extra sounds.    Impression & Recommendations:  Problem # 1:  URI (ICD-465.9)  Complete Medication List: 1)  Acyclovir 200 Mg Caps (Acyclovir) .... Take as dir as needed 2)  Citalopram Hydrobromide 20 Mg Tabs (Citalopram hydrobromide) .... One daily   Patient Instructions: 1)  Please schedule a follow-up appointment as needed. 2)  Get plenty of rest, drink lots of clear liquids, and use Tylenol or Ibuprofen for fever and comfort. Return in 7-10 days if you're not better:sooner if you're feeling worse.   ]

## 2011-03-30 NOTE — Assessment & Plan Note (Signed)
[facility]                            [facility] OFFICE NOTE   NAME:[name], Foster                     MRN:          [id number]  DATE:02/05/2007                            DOB:          [birth year]    The patient is a 60 year old female seen today for a wellness exam.  She  enjoys excellent health.  She is postmenopausal, last menses about one  year ago.  She takes Lexapro for depression and also Acyclovir p.r.n.  and has done quite well.  She has had outpatient wisdom tooth  extraction, otherwise no hospital admissions.   FAMILY HISTORY:  Both parents are living.  Father age 71 with a history  of coronary artery disease status post myocardial infarction at age 70.  Mother age 74 has obesity, hypertension and diabetes, 2 brothers are  well.   GENERAL:  A fairly well-developed, mildly overweight female in no acute  distress.  VITAL SIGNS:  Weight was 161, blood pressure 112/80.  FUNDI, EAR, NOSE AND THROAT:  Clear.  NECK:  No adenopathy or bruits.  CHEST:  Clear.  CARDIOVASCULAR:  Normal heart sounds, no murmurs.  ABDOMEN:  Benign, no organomegaly.  PELVIC:  Normal cervix, no adnexal masses.  EXTREMITIES:  Negative, full peripheral pulses.   IMPRESSION:  An unremarkable clinical exam, menopausal syndrome.   DISPOSITION:  We will continue on the Lexapro, return here in one year  for followup.     Gordy Savers, MD  Electronically Signed    PFK/MedQ  DD: 02/05/2007  DT: 02/05/2007  Job #: [id number]

## 2011-05-11 ENCOUNTER — Ambulatory Visit (INDEPENDENT_AMBULATORY_CARE_PROVIDER_SITE_OTHER): Payer: 59 | Admitting: Internal Medicine

## 2011-05-11 ENCOUNTER — Encounter: Payer: Self-pay | Admitting: Internal Medicine

## 2011-05-11 DIAGNOSIS — F329 Major depressive disorder, single episode, unspecified: Secondary | ICD-10-CM

## 2011-05-11 DIAGNOSIS — R109 Unspecified abdominal pain: Secondary | ICD-10-CM

## 2011-05-11 DIAGNOSIS — K5730 Diverticulosis of large intestine without perforation or abscess without bleeding: Secondary | ICD-10-CM

## 2011-05-11 MED ORDER — ESCITALOPRAM OXALATE 20 MG PO TABS: 20.0000 mg | ORAL_TABLET | Freq: Every day | ORAL | Status: DC

## 2011-05-11 MED ORDER — ACYCLOVIR 200 MG PO CAPS: 200.0000 mg | ORAL_CAPSULE | Freq: Two times a day (BID) | ORAL | Status: DC

## 2011-05-11 MED ORDER — HYOSCYAMINE SULFATE 0.125 MG SL SUBL: 0.1250 mg | SUBLINGUAL_TABLET | SUBLINGUAL | Status: DC | PRN

## 2011-05-11 MED ORDER — ALPRAZOLAM 0.5 MG PO TABS: 0.5000 mg | ORAL_TABLET | Freq: Two times a day (BID) | ORAL | Status: DC | PRN

## 2011-05-11 NOTE — Patient Instructions (Signed)
High fiber diet Lactose-free diet  Take medications as directed  Call or return to clinic prn if these symptoms worsen or fail to improve as anticipated.Lactose Free Diet Lactose is a carbohydrate that is found mainly in milk and milk products, as well as in foods with added milk or whey. Lactose must be digested by the enzyme in order to be used by the body. Lactose intolerance occurs when there is a shortage of lactase. When your body is not able to digest lactose, you may feel sick to your stomach (nausea), bloating, cramping, gas and diarrhea. TYPES OF LACTASE DEFICIENCY  Primary lactase deficiency. This is the most common type. It is characterized by a slow decrease in lactase activity.   Secondary lactase deficiency. This occurs following injury to the small intestinal mucosa as a result of diseases such as celiac disease, nontropical sprue, infectious gastroenteritis (stomach virus), malnutrition, parasites, or inflammatory bowel disease. It can also occur after treatment with medications that kill germs (antibiotics) or cancer drugs, or as a result of surgery.  Tolerance to lactose varies widely, and each person must determine how much milk can be consumed without developing symptoms. Drinking smaller portions of milk throughout the day may be helpful. Some studies suggest that slowing gastric emptying may help increase tolerance of milk products. This may be done by:  Consuming milk or milk products with a meal rather than alone.   Using milk with a higher fat content.  There are many dairy products that may be tolerated better than milk by some people:  Cheese (especially aged cheese) - the lactose content is much lower than in milk.   The use of cultured dairy products such as yogurt, buttermilk, cottage cheese, and sweet acidophilus milk (Kefir) for lactase-deficient individuals is usually well tolerated. This is because the healthy bacteria help digest lactose.    Lactose-hydrolyzed milk (Lactaid) contains 40-90% less lactose than milk and may also be well tolerated.  ADEQUACY These diets may be deficient in calcium, riboflavin, and vitamin D, according to the Recommended Dietary Allowances of the [employer]. Depending on individual tolerances and the use of milk substitutes, milk, or other dairy products, these recommendations may be met. SPECIAL NOTES  Lactose is a carbohydrates. The major food source is dairy products. Reading food labels is important. Many products contain lactose even when they are not made from milk. Look for the following words: whey, milk solids, dry milk solids, nonfat dry milk powder. Typical sources of lactose other than dairy products include breads, candies, cold cuts, prepared and processed foods, and commercial sauces and gravies.   All foods must be prepared without milk, cream, or other dairy foods.   A vitamin/mineral supplement may be necessary. Consult your physician or Registered Dietitian.   Lactose also is found in many prescription and over-the-counter medications.   Soy milk and lactose-free supplements may be used as an alternative to milk.  FOOD GROUP  ALLOWED/RECOMMENDED  AVOID/USE SPARINGLY   BREADS / STARCHES  4 servings or more*  Breads and rolls made without milk. [place], [place], or [place] bread.  Breads and rolls that contain milk. Prepared mixes such as muffins, biscuits, waffles, pancakes. Sweet rolls, donuts, [place] toast (if made with milk or lactose).   Crackers:  Soda crackers, graham crackers. Any crackers prepared without lactose.  Zwieback crackers, corn curls, or any that contain lactose.   Cereals:  Cooked or dry cereals prepared without lactose (read labels).  Cooked or dry cereals prepared with  lactose (read labels). Total, Cocoa Krispies. Special K.   Potatoes / Pasta / Rice:  Any prepared without milk or lactose. Popcorn.  Instant potatoes, frozen [place] fries,  scalloped or au gratin potatoes.   VEGETABLES  2 servings or more  Fresh, frozen, and canned vegetables.  Creamed or breaded vegetables. Vegetables in a cheese sauce or with lactose-containing margarines.   FRUIT  2 servings or more  All fresh, canned, or frozen fruits that are not processed with lactose.  Any canned or frozen fruits processed with lactose.   MEAT & SUBSTITUTES  2 servings or more (4 to 6 oz. total per day)  Plain beef, chicken, fish, [place], lamb, veal, pork, or ham. Kosher prepared meat products. Strained or junior meats that do not contain milk. Eggs, soy meat substitutes, nuts.  Scrambled eggs, omelets, and souffles that contain milk. Creamed or breaded meat, fish, or fowl. Sausage products such as wieners, liver sausage, or cold cuts that contain milk solids. Cheese, cottage cheese, or cheese spreads.   MILK  None. (See "BEVERAGES" for milk substitutes. See "DESSERTS" for ice cream and frozen desserts.)  Milk (whole, 2%, skim, or chocolate). Evaporated, powdered, or condensed milk; malted milk.   SOUPS & COMBINATION FOODS  Bouillon, broth, vegetable soups, clear soups, consomms. Homemade soups made with allowed ingredients. Combination or prepared foods that do not contain milk or milk products (read labels).  Cream soups, chowders, commercially prepared soups containing lactose. Macaroni and cheese, pizza. Combination or prepared foods that contain milk or milk products.   DESSERTS & SWEETS  In moderation  Water and fruit ices; gelatin; angel food cake. Homemade cookies, pies, or cakes made from allowed ingredients. Pudding (if made with water or a milk substitute). Lactose-free tofu desserts. Sugar, honey, corn syrup, jam, jelly; marmalade; molasses (beet sugar); Pure sugar candy; marshmallows.  Ice cream, ice milk, sherbet, custard, pudding, frozen yogurt. Commercial cake and cookie mixes. Desserts that contain chocolate. Pie crust made with milk-containing margarine;  reduced-calorie desserts made with a sugar substitute that contains lactose. Toffee, peppermint, butterscotch, chocolate, caramels.   FATS & OILS  In moderation  Butter (as tolerated; contains very small amounts of lactose). Margarines and dressings that do not contain milk, Vegetable oils, shortening, Miracle Whip, mayonnaise, nondairy cream & whipped toppings without lactose or milk solids added (examples: Coffee Rich, Carnation Coffeemate, Rich's Whipped Topping, PolyRich). Tomasa Blase.  Margarines and salad dressings containing milk; cream, cream cheese; peanut butter with added milk solids, sour cream, chip dips, made with sour cream.   BEVERAGES  Carbonated drinks; tea; coffee and freeze-dried coffee; some instant coffees (check labels). Fruit drinks; fruit and vegetable juice; Rice or Soy milk.  Ovaltine, hot chocolate. Some cocoas; some instant coffees; instant iced teas; powdered fruit drinks (read labels).    CONDIMENTS /  MISCELLANEOUS  Soy sauce, carob powder, olives, gravy made with water, baker's cocoa, pickles, pure seasonings and spices, wine, pure monosodium glutamate, catsup, mustard.  Some chewing gums, chocolate, some cocoas. Certain antibiotics and vitamin / mineral preparations. Spice blends if they contain milk products. MSG extender. Artificial sweeteners that contain lactose such as Equal (Nutra-Sweet) and Sweet 'n Low. Some nondairy creamers (read labels).   * These amounts indicate the minimum number of servings needed from the basic food groups to provide a variety of nutrients essential to good health. A maximum amount is listed if intake of certain foods must be controlled. Combination foods may count as full or partial servings from the food  groups. Dark green, leafy, or orange vegetables are recommended 3 or 4 times weekly to provide vitamin A. A good source of vitamin C is recommended daily. Potatoes may be included as a serving of vegetables. SAMPLE MENU*  Breakfast    Orange Juice.   Banana.    Bran flakes.      Nondairy Creamer.   Vienna Bread (toasted).     Butter or milk-free margarine.     Coffee or tea.       Noon Meal   Chicken Breast.   Rice.    Green beans.      Butter or milk-free margarine.   Fresh melon.     Coffee or tea.       Evening Meal  1. Roast Beef.  2. Baked potato.    3. Butter or milk-free margarine.    4. Broccoli.    1. Lettuce salad with vinegar and oil dressing.  2. [employer].    3. Coffee or tea.      Document Released: 04/20/2002 Document Re-Released: 04/26/2008 [facility] Patient Information 2011 [place], [place].High-Fiber Diet A high-fiber diet changes your normal diet to include more whole grains, legumes, fruits, and vegetables. Changes in the diet involve replacing refined carbohydrates with unrefined foods. The calorie level of the diet is essentially unchanged. The Dietary Reference Intake (recommended amount) for adult males is 38 grams per day. For adult females, it is 25 grams per day. Pregnant and lactating women should consume 28 grams of fiber per day. Fiber is the intact part of a plant that is not broken down during digestion. Functional fiber is fiber that has been isolated from the plant to provide a beneficial effect in the body. PURPOSE  Increase stool bulk.   Ease and regulate bowel movements.   Lower cholesterol.  INDICATIONS THAT YOU NEED MORE FIBER  Constipation and hemorrhoids.   Uncomplicated diverticulosis (intestine condition) and irritable bowel syndrome.   Weight management.   As a protective measure against hardening of the arteries (atherosclerosis), diabetes, and cancer.  NOTE OF CAUTION If you have a digestive or bowel problem, ask your caregiver for advice before adding high-fiber foods to your diet. Some of the following medical problems are such that a high-fiber diet should not be used without consulting your caregiver. DO NOT USE WITH:  Acute  diverticulitis (intestine infection).   Partial small bowel obstructions.   Complicated diverticular disease involving bleeding, rupture (perforation), or abscess (boil, furuncle).   Presence of autonomic neuropathy (nerve damage) or gastric paresis (stomach cannot empty itself).  GUIDELINES FOR INCREASING FIBER IN THE DIET  Start adding fiber to the diet slowly. A gradual increase of about 5 more grams (2 slices of whole-wheat bread, 2 servings of most fruits or vegetables, or 1 bowl of high-fiber cereal) per day is best. Too rapid an increase in fiber may result in constipation, flatulence, and bloating.   Drink enough water and fluids to keep your urine clear or pale yellow. Water, juice, or caffeine-free drinks are recommended. Not drinking enough fluid may cause constipation.   Eat a variety of high-fiber foods rather than one type of fiber.   Try to increase your intake of fiber through using high-fiber foods rather than fiber pills or supplements that contain small amounts of fiber.   The goal is to change the types of food eaten. Do not supplement your present diet with high-fiber foods, but replace foods in your present diet.  INCLUDE A VARIETY OF FIBER  SOURCES  Replace refined and processed grains with whole grains, canned fruits with fresh fruits, and incorporate other fiber sources. White rice, white breads, and most bakery goods contain little or no fiber.   Brown whole-grain rice, buckwheat oats, and many fruits and vegetables are all good sources of fiber. These include: broccoli, Brussels sprouts, cabbage, cauliflower, beets, sweet potatoes, white potatoes (skin on), carrots, tomatoes, eggplant, squash, berries, fresh fruits, and dried fruits.   Cereals appear to be the richest source of fiber. Cereal fiber is found in whole grains and bran. Bran is the fiber-rich outer coat of cereal grain, which is largely removed in refining. In whole-grain cereals, the bran remains. In  breakfast cereals, the largest amount of fiber is found in those with "bran" in their names. The fiber content is sometimes indicated on the label.   You may need to include additional fruits and vegetables each day.   In baking, for 1 cup white flour, you may use the following substitutions:   1 cup whole-wheat flour minus 2 tablespoons.   1/2 cup white flour plus 1/2 cup whole-wheat flour.  References: Dietary Reference Intakes: Recommended Intakes for Individuals. [employer]. Institute of Medicine. Food and Nutrition Board. Document Released: 10/29/2005 Document Re-Released: 01/23/2010 [facility] Patient Information 2011 [place], [place].

## 2011-05-11 NOTE — Progress Notes (Signed)
  Subjective:    Patient ID: [name], female    DOB: October 19, 1951, 60 y.o.   MRN: [id number]  HPI46 year old patient who presents with a chief complaint of abdominal pain. One year ago she suffered with the episodes of nausea and minimal pain this occurred last summer following the death of her father in the spring. At that time she was given Levsin and Xanax which she took for a few weeks but then symptoms resolved. Her predominant symptom last summer was nausea. Over the past month she has had 3 episodes of severe crampy abdominal pain this episode lasts one to 3 hours and is relieved by having a bowel movement and an episode of emesis. She has had less than and Xanax on hand which she has not taken consistently. No significant stressors at this time. She is on chronic Lexapro for depression she has had no abdominal or GYN surgery. She states her bowel movements are fairly normal. She does adhere to a fairly high fiber diet;  there has been correlation with area products and her abdominal discomfort   Review of Systems  Constitutional: Negative.   HENT: Negative for hearing loss, congestion, sore throat, rhinorrhea, dental problem, sinus pressure and tinnitus.   Eyes: Negative for pain, discharge and visual disturbance.  Respiratory: Negative for cough and shortness of breath.   Cardiovascular: Negative for chest pain, palpitations and leg swelling.  Gastrointestinal: Positive for vomiting and abdominal pain. Negative for nausea, diarrhea, constipation, blood in stool and abdominal distention.  Genitourinary: Negative for dysuria, urgency, frequency, hematuria, flank pain, vaginal bleeding, vaginal discharge, difficulty urinating, vaginal pain and pelvic pain.  Musculoskeletal: Negative for joint swelling, arthralgias and gait problem.  Skin: Negative for rash.  Neurological: Negative for dizziness, syncope, speech difficulty, weakness, numbness and headaches.  Hematological: Negative for  adenopathy.  Psychiatric/Behavioral: Negative for behavioral problems, dysphoric mood and agitation. The patient is not nervous/anxious.        Objective:   Physical Exam  Constitutional: She is oriented to person, place, and time. She appears well-developed and well-nourished. No distress.  HENT:  Head: Normocephalic.  Right Ear: External ear normal.  Left Ear: External ear normal.  Mouth/Throat: Oropharynx is clear and moist.  Eyes: Conjunctivae and EOM are normal. Pupils are equal, round, and reactive to light.  Neck: Normal range of motion. Neck supple. No thyromegaly present.  Cardiovascular: Normal rate, regular rhythm, normal heart sounds and intact distal pulses.   Pulmonary/Chest: Effort normal and breath sounds normal.  Abdominal: Soft. Bowel sounds are normal. She exhibits no distension and no mass. There is no tenderness. There is no rebound and no guarding.  Musculoskeletal: Normal range of motion.  Lymphadenopathy:    She has no cervical adenopathy.  Neurological: She is alert and oriented to person, place, and time.  Skin: Skin is warm and dry. No rash noted.  Psychiatric: She has a normal mood and affect. Her behavior is normal.          Assessment & Plan:   Episodic abdominal pain possible IBS. We'll place on a high fiber diet and avoid lactose rich foods; will continue Levsin and Xanax use.

## 2011-06-13 HISTORY — PX: ERCP: SHX60

## 2011-06-15 ENCOUNTER — Other Ambulatory Visit: Payer: Self-pay | Admitting: *Deleted

## 2011-06-15 MED ORDER — ALPRAZOLAM 0.5 MG PO TABS: 0.5000 mg | ORAL_TABLET | Freq: Two times a day (BID) | ORAL | Status: DC | PRN

## 2011-06-15 NOTE — Telephone Encounter (Signed)
ok 

## 2011-06-15 NOTE — Telephone Encounter (Signed)
patient  Is calling for a refill of xanax.  She is in [facility].  Is this okay to fill?

## 2011-06-15 NOTE — Telephone Encounter (Signed)
patient  Is aware and rx called in

## 2011-06-27 ENCOUNTER — Ambulatory Visit (INDEPENDENT_AMBULATORY_CARE_PROVIDER_SITE_OTHER): Payer: 59 | Admitting: Internal Medicine

## 2011-06-27 ENCOUNTER — Encounter: Payer: Self-pay | Admitting: Internal Medicine

## 2011-06-27 VITALS — BP 110/78 | Temp 98.7°F | Wt 154.0 lb

## 2011-06-27 DIAGNOSIS — R109 Unspecified abdominal pain: Secondary | ICD-10-CM

## 2011-06-27 NOTE — Patient Instructions (Signed)
Nexium one tablet daily for 2 weeks Gallbladder ultrasound as scheduled  Call or return to clinic prn if these symptoms worsen or fail to improve as anticipated.

## 2011-06-27 NOTE — Progress Notes (Signed)
  Subjective:    Patient ID: [name], female    DOB: Dec 27, 1950, 60 y.o.   MRN: [id number]  HPI  60 year old patient who is seen today in followup. She was evaluated in the emergency department in [place] 2 weeks ago for abdominal pain and intractable nausea and vomiting. Symptoms have not recurred she was in the ER for several hours receiving IV fluids laboratory studies revealed mild elevation of the total bilirubin. She has done well since her return to [facility] except for some mild epigastric discomfort. No further nausea or vomiting. Her bowel habits have normalized she does have a history of known diverticulosis but no episodes of diverticulitis. Today she feels well    Review of Systems  Constitutional: Negative.   HENT: Negative for hearing loss, congestion, sore throat, rhinorrhea, dental problem, sinus pressure and tinnitus.   Eyes: Negative for pain, discharge and visual disturbance.  Respiratory: Negative for cough and shortness of breath.   Cardiovascular: Negative for chest pain, palpitations and leg swelling.  Gastrointestinal: Negative for nausea, vomiting, abdominal pain, diarrhea, constipation, blood in stool and abdominal distention.  Genitourinary: Negative for dysuria, urgency, frequency, hematuria, flank pain, vaginal bleeding, vaginal discharge, difficulty urinating, vaginal pain and pelvic pain.  Musculoskeletal: Negative for joint swelling, arthralgias and gait problem.  Skin: Negative for rash.  Neurological: Negative for dizziness, syncope, speech difficulty, weakness, numbness and headaches.  Hematological: Negative for adenopathy.  Psychiatric/Behavioral: Negative for behavioral problems, dysphoric mood and agitation. The patient is not nervous/anxious.        Objective:   Physical Exam  Constitutional: She is oriented to person, place, and time. She appears well-developed and well-nourished.  HENT:  Head: Normocephalic.  Right Ear: External ear  normal.  Left Ear: External ear normal.  Mouth/Throat: Oropharynx is clear and moist.  Eyes: Conjunctivae and EOM are normal. Pupils are equal, round, and reactive to light.  Neck: Normal range of motion. Neck supple. No thyromegaly present.  Cardiovascular: Normal rate, regular rhythm, normal heart sounds and intact distal pulses.   Pulmonary/Chest: Effort normal and breath sounds normal.  Abdominal: Soft. Bowel sounds are normal. She exhibits no mass. There is tenderness.       Very mild epigastric tenderness  Musculoskeletal: Normal range of motion.  Lymphadenopathy:    She has no cervical adenopathy.  Neurological: She is alert and oriented to person, place, and time.  Skin: Skin is warm and dry. No rash noted.  Psychiatric: She has a normal mood and affect. Her behavior is normal.          Assessment & Plan:   Abdominal pain largely resolved. Patient may have symptomatic cholelithiasis. Will followup on a abdominal ultrasound. Will empirically place on Nexium for 2 weeks.

## 2011-06-29 ENCOUNTER — Telehealth: Payer: Self-pay

## 2011-06-29 ENCOUNTER — Inpatient Hospital Stay (HOSPITAL_COMMUNITY)
Admission: AD | Admit: 2011-06-29 | Discharge: 2011-06-30 | DRG: 446 | Disposition: A | Discharge status: Home / Self Care | Payer: 59 | Source: Ambulatory Visit | Attending: Gastroenterology | Admitting: Gastroenterology

## 2011-06-29 ENCOUNTER — Ambulatory Visit
Admission: RE | Admit: 2011-06-29 | Discharge: 2011-06-29 | Disposition: A | Discharge status: Home / Self Care | Payer: 59 | Source: Ambulatory Visit | Attending: Internal Medicine | Admitting: Internal Medicine

## 2011-06-29 ENCOUNTER — Telehealth: Payer: Self-pay | Admitting: *Deleted

## 2011-06-29 DIAGNOSIS — K8070 Calculus of gallbladder and bile duct without cholecystitis without obstruction: Principal | ICD-10-CM | POA: Diagnosis present

## 2011-06-29 DIAGNOSIS — F3289 Other specified depressive episodes: Secondary | ICD-10-CM | POA: Diagnosis present

## 2011-06-29 DIAGNOSIS — Z8719 Personal history of other diseases of the digestive system: Secondary | ICD-10-CM

## 2011-06-29 DIAGNOSIS — R1011 Right upper quadrant pain: Secondary | ICD-10-CM

## 2011-06-29 DIAGNOSIS — R932 Abnormal findings on diagnostic imaging of liver and biliary tract: Secondary | ICD-10-CM

## 2011-06-29 DIAGNOSIS — F329 Major depressive disorder, single episode, unspecified: Secondary | ICD-10-CM | POA: Diagnosis present

## 2011-06-29 DIAGNOSIS — R109 Unspecified abdominal pain: Secondary | ICD-10-CM

## 2011-06-29 LAB — LIPASE, BLOOD: Lipase: 20 U/L (ref 11–59)

## 2011-06-29 LAB — COMPREHENSIVE METABOLIC PANEL
ALT: 313 U/L — ABNORMAL HIGH (ref 0–35)
AST: 679 U/L — ABNORMAL HIGH (ref 0–37)
Albumin: 4.1 g/dL (ref 3.5–5.2)
Alkaline Phosphatase: 369 U/L — ABNORMAL HIGH (ref 39–117)
BUN: 7 mg/dL (ref 6–23)
CO2: 26 mEq/L (ref 19–32)
Calcium: 10 mg/dL (ref 8.4–10.5)
Chloride: 101 mEq/L (ref 96–112)
Creatinine, Ser: 0.55 mg/dL (ref 0.50–1.10)
GFR calc Af Amer: >60 mL/min (ref >60)
GFR calc non Af Amer: >60 mL/min (ref >60)
Glucose, Bld: 122 mg/dL — ABNORMAL HIGH (ref 70–99)
Potassium: 4.1 mEq/L (ref 3.5–5.1)
Sodium: 138 mEq/L (ref 135–145)
Total Bilirubin: 3.7 mg/dL — ABNORMAL HIGH (ref 0.3–1.2)
Total Protein: 7.5 g/dL (ref 6.0–8.3)

## 2011-06-29 LAB — CBC
HCT: 35.9 % — ABNORMAL LOW (ref 36.0–46.0)
Hemoglobin: 12.3 g/dL (ref 12.0–15.0)
MCH: 30.1 pg (ref 26.0–34.0)
MCHC: 34.3 g/dL (ref 30.0–36.0)
MCV: 87.8 fL (ref 78.0–100.0)
Platelets: 321 10*3/uL (ref 150–400)
RBC: 4.09 MIL/uL (ref 3.87–5.11)
RDW: 12.5 % (ref 11.5–15.5)
WBC: 7.5 10*3/uL (ref 4.0–10.5)

## 2011-06-29 LAB — PROTIME-INR
INR: 0.97 (ref 0.00–1.49)
Prothrombin Time: 13.1 seconds (ref 11.6–15.2)

## 2011-06-29 LAB — AMYLASE: Amylase: 35 U/L (ref 0–105)

## 2011-06-29 NOTE — Telephone Encounter (Signed)
Dr Amador Cunas spoke with pt - admit

## 2011-06-29 NOTE — Telephone Encounter (Signed)
Called Dr Arlyce Dice and advised that Dr Jarold Motto spoke to Dr Amador Cunas and patient needs an ERCP, he suggested that patient be admitted to [facility] since she was having severe abd pain and vomiting. I called and got the patient a bed at [facility] she was on her way to [facility] at that time so she states that she will turn around and go to [facility]. Called Willette Cluster and advised pt is heading to [facility] and going to [address]. Patient states that she needs fluids and something to make her stop vomiting bc she cant stop. I advised her they will make sure she is taken care of once she is admitted

## 2011-06-29 NOTE — Telephone Encounter (Signed)
VM from pt.- vomiting  and has pain, would someone call something in .   Please advise

## 2011-06-29 NOTE — Telephone Encounter (Signed)
Pt has been admitted per Dr. Amador Cunas today - needed to let you know .

## 2011-06-29 NOTE — Telephone Encounter (Signed)
Spoke with pt- per dr. Vernon Prey request - gallstones in neck of gallbladder and CBD distended. He has spoke with GI and they should be contacting r/t ERCP that will needto be done. If she does not hear from they by 4pm today , please call me. KIK

## 2011-06-30 ENCOUNTER — Inpatient Hospital Stay (HOSPITAL_COMMUNITY): Payer: 59

## 2011-06-30 DIAGNOSIS — K8050 Calculus of bile duct without cholangitis or cholecystitis without obstruction: Secondary | ICD-10-CM

## 2011-07-05 ENCOUNTER — Telehealth: Payer: Self-pay | Admitting: Gastroenterology

## 2011-07-05 NOTE — Telephone Encounter (Signed)
Pt states she was told to follow-up with Dr. Ezzard Standing in 1 week. She had the ERCP and feels good now, pain is gone. She was unable to get an appt with Dr. Ezzard Standing in 1 week. Her appt is for 07/27/11. Pt states she is ok with this appt but wanted to make sure that was ok with Dr. Arlyce Dice. Please advise.

## 2011-07-06 ENCOUNTER — Telehealth: Payer: Self-pay | Admitting: Internal Medicine

## 2011-07-06 MED ORDER — DOXYCYCLINE HYCLATE 100 MG PO TABS: 100.0000 mg | ORAL_TABLET | Freq: Two times a day (BID) | ORAL | Status: AC

## 2011-07-06 NOTE — Telephone Encounter (Signed)
Doxycycline 100 mg #14 one twice daily 

## 2011-07-06 NOTE — Telephone Encounter (Signed)
Spoke with pt - informed of rx to be sent to [facility] . KIK

## 2011-07-06 NOTE — Telephone Encounter (Signed)
Pt left message on triage line. Was here for appt on 8/15. At the time she had lung congestion for 1wk. Now it is worse, has migrated to head, and pt has brown/green sputum, etc. Pt want to know if this is serious, and if she should come in. It started around 8/7. Please call.

## 2011-07-06 NOTE — Telephone Encounter (Signed)
To see or rx ?

## 2011-07-08 NOTE — Telephone Encounter (Signed)
It is ok

## 2011-07-09 NOTE — Telephone Encounter (Signed)
Pt aware.

## 2011-07-24 ENCOUNTER — Other Ambulatory Visit: Payer: Self-pay | Admitting: Internal Medicine

## 2011-07-27 ENCOUNTER — Ambulatory Visit (INDEPENDENT_AMBULATORY_CARE_PROVIDER_SITE_OTHER): Payer: 59 | Admitting: Surgery

## 2011-07-27 ENCOUNTER — Encounter (INDEPENDENT_AMBULATORY_CARE_PROVIDER_SITE_OTHER): Payer: Self-pay | Admitting: Surgery

## 2011-07-27 VITALS — BP 132/86 | HR 64 | Temp 98.1°F | Ht 66.0 in | Wt 151.8 lb

## 2011-07-27 DIAGNOSIS — K8020 Calculus of gallbladder without cholecystitis without obstruction: Secondary | ICD-10-CM

## 2011-07-27 NOTE — Progress Notes (Addendum)
ASSESSMENT AND PLAN: 1.  Gallstones.  I discussed with the patient the indications and risks of gall bladder surgery.  The primary risks of gall bladder surgery include, but are not limited to, bleeding, infection, common bile duct injury, and open surgery.  There is also the risk that the patient may have continued symptoms after surgery. I tried to answer the patient's questions.  I gave the patient literature about gall bladder surgery.  Husband in room.  2.  Choledocholithiasis.  Treated by Dr. Arlyce Dice with ERCP 06/30/2011.  [All LFTs have returned to normal.  DN 08/08/11] 3.  Depression. 4.  Headaches.  Helped with Lexapro. 5.  Genital herpes.   Chief Complaint  Patient presents with  . Other    new pt- eval GB    HISTORY OF PRESENT ILLNESS: [name] is a 60 y.o. (DOB: [birth year])  white female who is a patient of Rogelia Boga, MD and comes to me today for gall bladder disease.  She had symptoms which began in June 2012. She would have epigastric abdominal pain with nausea and last for short period of time. She saw Dr. Eleonore Chiquito who placed her on Nexium. She seemed to get some relief in her symptoms.  [place] when she had a severe attack that lasted more than 24 hours. She had nausea and vomiting, went to the emergency room, and they suggested she may have gallbladder disease.  She returned to [facility] where she had a ultrasound of her abdomen on 29 June 2011. This showed both choledocholithiasis and cholelithiasis. She underwent an ERCP with sphincterotomy by Dr. Barnet Pall on 30 June 2011. She did well with the ERCP and has been essentially asymptomatic since the procedure.  She comes today to talk about elective gallbladder surgery.  She has no history of stomach, liver, pancreas, or colon disease. She had a negative colonoscopy in 2003.  Past Medical History  Diagnosis Date  . ABDOMINAL PAIN 05/01/2010  . DEPRESSION  07/21/2007  . DIVERTICULOSIS, COLON 07/26/2008  . Headache 07/26/2008    Past Surgical History  Procedure Date  . Ercp august 2012    Current Outpatient Prescriptions  Medication Sig Dispense Refill  . acyclovir (ZOVIRAX) 200 MG capsule TAKE AS DIRECTED AS NEEDED  60 capsule  4  . escitalopram (LEXAPRO) 20 MG tablet Take 1 tablet (20 mg total) by mouth daily.  90 tablet  4    Allergies  Allergen Reactions  . Sulfamethoxazole     REACTION: unspecified    REVIEW OF SYSTEMS: Skin:  No history of rash.  No history of abnormal moles. Infection:  No history of hepatitis or HIV.  No history of MRSA. Neurologic:  History of headaches.  Resolved with Lexapro. Cardiac:  No history of hypertension. No history of heart disease.  No history of prior cardiac catheterization.  No history of seeing a cardiologist. Pulmonary:  Does not smoke cigarettes.  No asthma or bronchitis.  No OSA/CPAP.  Endocrine:  No diabetes. No thyroid disease. Gastrointestinal:  No history of stomach disease.  No history of liver disease.  No history of gall bladder disease.  No history of pancreas disease.  No history of colon disease.  ERCP done 06/30/2011.  Had colonoscopy in 2003. Urologic:  No history of kidney stones.  No history of bladder infections. Musculoskeletal:  No history of joint or back disease. Hematologic:  No bleeding disorder.  No history of anemia.  Not anticoagulated.  SOCIAL and FAMILY HISTORY: Accompanied with husband. Works Social worker, regarding hospital.  PHYSICAL EXAM: BP 132/86  Pulse 64  Temp(Src) 98.1 F (36.7 C) (Temporal)  Ht 5\' 6"  (1.676 m)  Wt 151 lb 12.8 oz (68.856 kg)  BMI 24.50 kg/m2  General: WNWF HEENT: Normal. Pupils equal. Normal dentition. Neck: Supple. No thyroid mass. Lymph Nodes:  No supraclavicular or cervical nodes. Lungs: Clear and symmetric. Heart:  RRR. No murmur. Abdomen: No mass. No tenderness. No hernia. Normal bowel sounds.  No  abdominal scars. Rectal: Not done. Extremities:  Good strength in upper and lower extremities. Neurologic:  Grossly intact to motor and sensory function.  DATA REVIEWED: Dr. Marzetta Board ERCP and labs.  Ovidio Kin, M.D., [facility]

## 2011-08-08 ENCOUNTER — Encounter (HOSPITAL_COMMUNITY): Payer: 59

## 2011-08-08 ENCOUNTER — Other Ambulatory Visit (INDEPENDENT_AMBULATORY_CARE_PROVIDER_SITE_OTHER): Payer: Self-pay | Admitting: Surgery

## 2011-08-08 ENCOUNTER — Ambulatory Visit (HOSPITAL_COMMUNITY)
Admission: RE | Admit: 2011-08-08 | Discharge: 2011-08-08 | Disposition: A | Discharge status: Home / Self Care | Payer: 59 | Source: Ambulatory Visit | Attending: Surgery | Admitting: Surgery

## 2011-08-08 DIAGNOSIS — R05 Cough: Secondary | ICD-10-CM | POA: Insufficient documentation

## 2011-08-08 DIAGNOSIS — Z01818 Encounter for other preprocedural examination: Secondary | ICD-10-CM

## 2011-08-08 DIAGNOSIS — Z01812 Encounter for preprocedural laboratory examination: Secondary | ICD-10-CM | POA: Insufficient documentation

## 2011-08-08 DIAGNOSIS — R059 Cough, unspecified: Secondary | ICD-10-CM | POA: Insufficient documentation

## 2011-08-08 LAB — CBC
HCT: 39.6 % (ref 36.0–46.0)
Hemoglobin: 13.3 g/dL (ref 12.0–15.0)
MCH: 28.9 pg (ref 26.0–34.0)
MCHC: 33.6 g/dL (ref 30.0–36.0)
MCV: 85.9 fL (ref 78.0–100.0)
Platelets: 230 10*3/uL (ref 150–400)
RBC: 4.61 MIL/uL (ref 3.87–5.11)
RDW: 12.5 % (ref 11.5–15.5)
WBC: 4.8 10*3/uL (ref 4.0–10.5)

## 2011-08-08 LAB — DIFFERENTIAL
Basophils Absolute: 0 10*3/uL (ref 0.0–0.1)
Basophils Relative: 1 % (ref 0–1)
Eosinophils Absolute: 0.3 10*3/uL (ref 0.0–0.7)
Eosinophils Relative: 7 % — ABNORMAL HIGH (ref 0–5)
Lymphocytes Relative: 36 % (ref 12–46)
Lymphs Abs: 1.7 10*3/uL (ref 0.7–4.0)
Monocytes Absolute: 0.4 10*3/uL (ref 0.1–1.0)
Monocytes Relative: 9 % (ref 3–12)
Neutro Abs: 2.3 10*3/uL (ref 1.7–7.7)
Neutrophils Relative %: 48 % (ref 43–77)

## 2011-08-08 LAB — COMPREHENSIVE METABOLIC PANEL
ALT: 10 U/L (ref 0–35)
AST: 19 U/L (ref 0–37)
Albumin: 4.3 g/dL (ref 3.5–5.2)
Alkaline Phosphatase: 91 U/L (ref 39–117)
BUN: 10 mg/dL (ref 6–23)
CO2: 30 mEq/L (ref 19–32)
Calcium: 9.9 mg/dL (ref 8.4–10.5)
Chloride: 104 mEq/L (ref 96–112)
Creatinine, Ser: 0.6 mg/dL (ref 0.50–1.10)
GFR calc Af Amer: >60 mL/min (ref >60)
GFR calc non Af Amer: >60 mL/min (ref >60)
Glucose, Bld: 88 mg/dL (ref 70–99)
Potassium: 4.3 mEq/L (ref 3.5–5.1)
Sodium: 142 mEq/L (ref 135–145)
Total Bilirubin: 0.8 mg/dL (ref 0.3–1.2)
Total Protein: 7.1 g/dL (ref 6.0–8.3)

## 2011-08-08 LAB — SURGICAL PCR SCREEN
MRSA, PCR: NEGATIVE
Staphylococcus aureus: POSITIVE — AB

## 2011-08-14 ENCOUNTER — Other Ambulatory Visit (INDEPENDENT_AMBULATORY_CARE_PROVIDER_SITE_OTHER): Payer: Self-pay | Admitting: Surgery

## 2011-08-14 ENCOUNTER — Ambulatory Visit (HOSPITAL_COMMUNITY): Payer: 59

## 2011-08-14 ENCOUNTER — Ambulatory Visit (HOSPITAL_COMMUNITY)
Admission: RE | Admit: 2011-08-14 | Discharge: 2011-08-14 | Disposition: A | Discharge status: Home / Self Care | Payer: 59 | Source: Ambulatory Visit | Attending: Surgery | Admitting: Surgery

## 2011-08-14 DIAGNOSIS — K8010 Calculus of gallbladder with chronic cholecystitis without obstruction: Secondary | ICD-10-CM

## 2011-08-14 HISTORY — PX: LAPAROSCOPIC CHOLECYSTECTOMY: SUR755

## 2011-08-20 NOTE — Op Note (Signed)
[name], [name]              ACCOUNT NO.:  [id number]  MEDICAL RECORD NO.:  [id number]  LOCATION:  DAYL                         FACILITY:  [facility]  PHYSICIAN:  [name]. [name], M.D.  DATE OF BIRTH:  [birth year]  DATE OF PROCEDURE:  08/14/2011                              OPERATIVE REPORT  PREOPERATIVE DIAGNOSES:  Cholelithiasis, chronic cholecystitis, history of choledocholithiasis.  POSTOPERATIVE DIAGNOSES:  Cholelithiasis, chronic cholecystitis, history of choledocholithiasis.  PROCEDURE:  Laparoscopic cholecystectomy with intraoperative cholangiogram.  SURGEON:  [name]. [name], M.D.  FIRST ASSISTANT:  [name], M.D.  ANESTHESIA:  General endotracheal with 30 cc of 0.25% Marcaine.  COMPLICATIONS:  None.  INDICATIONS FOR PROCEDURE:  Ms. [name] is a 60 year old white female, a patient of Dr. Eleonore Foster, who has had vague abdominal pain this summer.  She presented in [place] after having attack of abdominal pain, which took her to the [facility] Emergency Room.  She was found to have common bile duct stones and she underwent an ERCP with sphincterotomy by Dr. Melvia Foster on June 30, 2011.  She has done well with ERCP.  Her liver functions are returned to normal.  She now comes for elective laparoscopic cholecystectomy.  I discussed with her the indications, potential complications of gallbladder surgery.  The risks of gallbladder surgery include, but are not limited to, bleeding, infection, common bile duct injury, and the possibility of open surgery.  OPERATIVE NOTE:  The patient is taken to room #1 where she underwent a general endotracheal anesthetic.  Her abdomen was prepped with a ChloraPrep and sterilely draped.  A time-out was held and surgical checklist run.  She was given 1 g of Ancef at initial procedure.  I accessed the abdominal cavity through an infraumbilical incision with sharp dissection carried down to the abdominal  cavity.  A 10 mm laparoscope was inserted through a 12 mm Hasson trocar.  The Hasson trocar was secured with a 0-Vicryl suture and abdominal exploration carried out. The right and left lobes of liver unremarkable, stomach unremarkable, and the bowel that I could see was unremarkable.  I placed 3 additional trocars of 10 mm subxiphoid trocar, 5 mm right mid-subcostal trocar, and 5 mm lateral subcostal trocar.  The gallbladder itself had evidence of thickening with adhesions around it consistent with chronic cholecystitis.  These adhesions were taken down with sharp dissection and Bovie electrocautery.  I got down to the cystic duct gallbladder junction.  I then shot an intraoperative cholangiogram.  Intraoperative cholangiogram shot using a Taut catheter with a clip placed on the gallbladder side of the cystic duct.  The Taut catheter inserted to the cut cystic duct and secured with an Endoclip.  I used about 8 cc of Omnipaque under fluoroscopy showing free flow of contrast down the cystic duct into common bile duct up the hepatic radicals and out into the duodenum.  The common bile duct was noted to be dilated consistent with recent choledocholithiasis, but there was no leak and no obvious residual or retained stones.  This was felt to be a normal cholangiogram with a patient with a history of choledocholithiasis.  I then removed the Taut catheter.  I triply endoclipped the  cystic duct and divided the cystic duct.  I doubly endoclipped the cystic artery and there was a posterior branch.  I doubly endoclipped also the cystic artery.  I then sharply and bluntly dissected the gallbladder from the gallbladder bed.  Prior to commencing division of the gallbladder from the gallbladder bed, I irrigated the gallbladder bed and triangle of Calot.  There was no bleeding or bile leak.  The gallbladder was then divided to put into an EndoCatch bag, delivered through the umbilicus. I used about  a liter of fluid irrigating the abdomen.  I then removed the umbilical port and closed this with 0-Vicryl suture.  The skin at each site was closed with a 5-0 Vicryl suture and then painted with Dermabond.  Sponge and needle count were correct at the end of the case.   The patient tolerated the procedure well. She was transferrred to the recovery room in good condition.  The plan is for her be discharged home today if she does well and returned to see me in 2 to 3 weeks.   [name]. [name], M.D., FACS   DHN/MEDQ  D:  08/14/2011  T:  08/15/2011  Job:  [id number]  cc:   Alexis Savers, MD [address] [place] [address]  Alexis Hair. Arlyce Dice, MD,FACG [address] [place] [address]  Electronically Signed by [name] M.D. on 08/20/2011 14:78:29 AM

## 2011-09-07 ENCOUNTER — Encounter (INDEPENDENT_AMBULATORY_CARE_PROVIDER_SITE_OTHER): Payer: 59 | Admitting: Surgery

## 2011-09-12 ENCOUNTER — Encounter (INDEPENDENT_AMBULATORY_CARE_PROVIDER_SITE_OTHER): Payer: Self-pay

## 2011-09-13 ENCOUNTER — Encounter (INDEPENDENT_AMBULATORY_CARE_PROVIDER_SITE_OTHER): Payer: Self-pay | Admitting: Surgery

## 2011-09-13 ENCOUNTER — Ambulatory Visit (INDEPENDENT_AMBULATORY_CARE_PROVIDER_SITE_OTHER): Payer: 59 | Admitting: Surgery

## 2011-09-13 VITALS — BP 118/76 | HR 70 | Temp 97.1°F | Resp 14 | Ht 66.0 in | Wt 153.2 lb

## 2011-09-13 DIAGNOSIS — Z9049 Acquired absence of other specified parts of digestive tract: Secondary | ICD-10-CM

## 2011-09-13 DIAGNOSIS — Z9089 Acquired absence of other organs: Secondary | ICD-10-CM

## 2011-09-13 NOTE — Progress Notes (Signed)
ASSESSMENT AND PLAN: 1.  S/p lap chole with IOC on 08/14/2011  She has done well with surgery.  I made her appt PRN.  2.  Choledocholithiasis.             Treated by Dr. Arlyce Dice with ERCP 06/30/2011.             [All LFTs have returned to normal.  DN 08/08/11] 3.  Depression. 4.  Headaches.  Helped with Lexapro. 5.  Genital herpes.  HISTORY OF PRESENT ILLNESS: Chief Complaint  Patient presents with  . Routine Post Op    Lap Chole sx 08/14/11    [name] is a 60 y.o. (DOB: 06-17-1951)  white female who is a patient of Rogelia Boga, MD and comes to me today for follow-up of gall bladder surgery.  She has done well.  No complaints.  She wants to donate blood and I said that is okay.  PHYSICAL EXAM: BP 118/76  Pulse 70  Temp(Src) 97.1 F (36.2 C) (Temporal)  Resp 14  Ht 5\' 6"  (1.676 m)  Wt 153 lb 3.2 oz (69.491 kg)  BMI 24.73 kg/m2  Abdomen:  Incisions well healed.  DATA REVIEWED: Path report to patient.

## 2011-10-01 ENCOUNTER — Other Ambulatory Visit: Payer: Self-pay | Admitting: Internal Medicine

## 2011-10-01 DIAGNOSIS — Z1231 Encounter for screening mammogram for malignant neoplasm of breast: Secondary | ICD-10-CM

## 2011-10-31 ENCOUNTER — Ambulatory Visit (HOSPITAL_COMMUNITY)
Admission: RE | Admit: 2011-10-31 | Discharge: 2011-10-31 | Disposition: A | Discharge status: Home / Self Care | Payer: 59 | Source: Ambulatory Visit | Attending: Internal Medicine | Admitting: Internal Medicine

## 2011-10-31 DIAGNOSIS — Z1231 Encounter for screening mammogram for malignant neoplasm of breast: Secondary | ICD-10-CM | POA: Insufficient documentation

## 2012-02-11 ENCOUNTER — Ambulatory Visit (INDEPENDENT_AMBULATORY_CARE_PROVIDER_SITE_OTHER): Payer: 59 | Admitting: Internal Medicine

## 2012-02-11 ENCOUNTER — Encounter: Payer: Self-pay | Admitting: Internal Medicine

## 2012-02-11 VITALS — BP 120/80 | Temp 97.9°F | Wt 157.0 lb

## 2012-02-11 DIAGNOSIS — R109 Unspecified abdominal pain: Secondary | ICD-10-CM

## 2012-02-11 DIAGNOSIS — R51 Headache: Secondary | ICD-10-CM

## 2012-02-11 MED ORDER — ACYCLOVIR 200 MG PO CAPS: 200.0000 mg | ORAL_CAPSULE | Freq: Two times a day (BID) | ORAL | Status: DC

## 2012-02-11 MED ORDER — ESCITALOPRAM OXALATE 20 MG PO TABS: 20.0000 mg | ORAL_TABLET | Freq: Every day | ORAL | Status: DC

## 2012-02-11 NOTE — Patient Instructions (Signed)
Call or return to clinic prn if these symptoms worsen or fail to improve as anticipated.

## 2012-02-11 NOTE — Progress Notes (Signed)
  Subjective:    Patient ID: [name], female    DOB: [birth year], 61 y.o.   MRN: [id number]  HPI  61 year old patient who is in today for followup. She has a history of headaches which have been managed with Lexapro. She states that she has been headache free for some time she has given herself 2 trial soft medication with relapse of her headaches. She has done extremely well since a cholecystectomy last year. She has been released from general surgery She [place]    Review of Systems  Constitutional: Negative.   HENT: Negative for hearing loss, congestion, sore throat, rhinorrhea, dental problem, sinus pressure and tinnitus.   Eyes: Negative for pain, discharge and visual disturbance.  Respiratory: Negative for cough and shortness of breath.   Cardiovascular: Negative for chest pain, palpitations and leg swelling.  Gastrointestinal: Negative for nausea, vomiting, abdominal pain, diarrhea, constipation, blood in stool and abdominal distention.  Genitourinary: Negative for dysuria, urgency, frequency, hematuria, flank pain, vaginal bleeding, vaginal discharge, difficulty urinating, vaginal pain and pelvic pain.  Musculoskeletal: Negative for joint swelling, arthralgias and gait problem.  Skin: Negative for rash.  Neurological: Positive for headaches. Negative for dizziness, syncope, speech difficulty, weakness and numbness.  Hematological: Negative for adenopathy.  Psychiatric/Behavioral: Negative for behavioral problems, dysphoric mood and agitation. The patient is not nervous/anxious.        Objective:   Physical Exam  Constitutional: She is oriented to person, place, and time. She appears well-developed and well-nourished.  HENT:  Head: Normocephalic.  Right Ear: External ear normal.  Left Ear: External ear normal.  Mouth/Throat: Oropharynx is clear and moist.  Eyes: Conjunctivae and EOM are normal. Pupils are equal, round,  and reactive to light.  Neck: Normal range of motion. Neck supple. No thyromegaly present.  Cardiovascular: Normal rate, regular rhythm, normal heart sounds and intact distal pulses.   Pulmonary/Chest: Effort normal and breath sounds normal.  Abdominal: Soft. Bowel sounds are normal. She exhibits no mass. There is no tenderness.  Musculoskeletal: Normal range of motion.  Lymphadenopathy:    She has no cervical adenopathy.  Neurological: She is alert and oriented to person, place, and time.  Skin: Skin is warm and dry. No rash noted.  Psychiatric: She has a normal mood and affect. Her behavior is normal.          Assessment & Plan:    Headaches. Stable on medication. Medications refilled History of genital herpes. Acyclovir refilled   [place] return here when necessary

## 2013-02-19 ENCOUNTER — Other Ambulatory Visit: Payer: Self-pay | Admitting: Internal Medicine

## 2015-05-05 ENCOUNTER — Encounter: Payer: Self-pay | Admitting: Gastroenterology

## 2017-06-17 DIAGNOSIS — Z01419 Encounter for gynecological examination (general) (routine) without abnormal findings: Secondary | ICD-10-CM | POA: Diagnosis not present

## 2017-06-17 DIAGNOSIS — Z6824 Body mass index (BMI) 24.0-24.9, adult: Secondary | ICD-10-CM | POA: Diagnosis not present

## 2017-06-17 DIAGNOSIS — Z1231 Encounter for screening mammogram for malignant neoplasm of breast: Secondary | ICD-10-CM | POA: Diagnosis not present

## 2017-08-13 DIAGNOSIS — Z79899 Other long term (current) drug therapy: Secondary | ICD-10-CM | POA: Diagnosis not present

## 2017-08-13 DIAGNOSIS — E663 Overweight: Secondary | ICD-10-CM | POA: Diagnosis not present

## 2017-08-13 DIAGNOSIS — R7301 Impaired fasting glucose: Secondary | ICD-10-CM | POA: Diagnosis not present

## 2017-08-20 DIAGNOSIS — J301 Allergic rhinitis due to pollen: Secondary | ICD-10-CM | POA: Diagnosis not present

## 2017-08-20 DIAGNOSIS — Z780 Asymptomatic menopausal state: Secondary | ICD-10-CM | POA: Diagnosis not present

## 2017-08-20 DIAGNOSIS — G8929 Other chronic pain: Secondary | ICD-10-CM | POA: Diagnosis not present

## 2017-08-20 DIAGNOSIS — M5440 Lumbago with sciatica, unspecified side: Secondary | ICD-10-CM | POA: Diagnosis not present

## 2017-08-20 DIAGNOSIS — Z23 Encounter for immunization: Secondary | ICD-10-CM | POA: Diagnosis not present

## 2017-08-20 DIAGNOSIS — Z85828 Personal history of other malignant neoplasm of skin: Secondary | ICD-10-CM | POA: Diagnosis not present

## 2017-08-20 DIAGNOSIS — Z1159 Encounter for screening for other viral diseases: Secondary | ICD-10-CM | POA: Diagnosis not present

## 2017-08-20 DIAGNOSIS — F3340 Major depressive disorder, recurrent, in remission, unspecified: Secondary | ICD-10-CM | POA: Diagnosis not present

## 2017-10-15 ENCOUNTER — Ambulatory Visit: Payer: Self-pay | Admitting: Family Medicine

## 2017-10-16 ENCOUNTER — Encounter: Payer: Self-pay | Admitting: Family Medicine

## 2017-10-16 ENCOUNTER — Ambulatory Visit (INDEPENDENT_AMBULATORY_CARE_PROVIDER_SITE_OTHER): Payer: Medicare Other | Admitting: Family Medicine

## 2017-10-16 VITALS — BP 100/70 | HR 72 | Temp 97.4°F | Ht 65.75 in | Wt 150.7 lb

## 2017-10-16 DIAGNOSIS — B009 Herpesviral infection, unspecified: Secondary | ICD-10-CM | POA: Diagnosis not present

## 2017-10-16 DIAGNOSIS — F325 Major depressive disorder, single episode, in full remission: Secondary | ICD-10-CM | POA: Diagnosis not present

## 2017-10-16 DIAGNOSIS — Z780 Asymptomatic menopausal state: Secondary | ICD-10-CM | POA: Diagnosis not present

## 2017-10-16 DIAGNOSIS — Z7689 Persons encountering health services in other specified circumstances: Secondary | ICD-10-CM | POA: Diagnosis not present

## 2017-10-16 NOTE — Patient Instructions (Addendum)
You are due for a shingles vaccine.  It will likely be easier for you to receive this vaccine from your local pharmacy.  You can schedule an appointment for a physical at your earliest convenience.Health Maintenance, Female Adopting a healthy lifestyle and getting preventive care can go a long way to promote health and wellness. Talk with your health care provider about what schedule of regular examinations is right for you. This is a good chance for you to check in with your provider about disease prevention and staying healthy. In between checkups, there are plenty of things you can do on your own. Experts have done a lot of research about which lifestyle changes and preventive measures are most likely to keep you healthy. Ask your health care provider for more information. Weight and diet Eat a healthy diet  Be sure to include plenty of vegetables, fruits, low-fat dairy products, and lean protein.  Do not eat a lot of foods high in solid fats, added sugars, or salt.  Get regular exercise. This is one of the most important things you can do for your health. ? Most adults should exercise for at least 150 minutes each week. The exercise should increase your heart rate and make you sweat (moderate-intensity exercise). ? Most adults should also do strengthening exercises at least twice a week. This is in addition to the moderate-intensity exercise.  Maintain a healthy weight  Body mass index (BMI) is a measurement that can be used to identify possible weight problems. It estimates body fat based on height and weight. Your health care provider can help determine your BMI and help you achieve or maintain a healthy weight.  For females 59 years of age and older: ? A BMI below 18.5 is considered underweight. ? A BMI of 18.5 to 24.9 is normal. ? A BMI of 25 to 29.9 is considered overweight. ? A BMI of 30 and above is considered obese.  Watch levels of cholesterol and blood lipids  You should  start having your blood tested for lipids and cholesterol at 65 years of age, then have this test every 5 years.  You may need to have your cholesterol levels checked more often if: ? Your lipid or cholesterol levels are high. ? You are older than 65 years of age. ? You are at high risk for heart disease.  Cancer screening Lung Cancer  Lung cancer screening is recommended for adults 50-82 years old who are at high risk for lung cancer because of a history of smoking.  A yearly low-dose CT scan of the lungs is recommended for people who: ? Currently smoke. ? Have quit within the past 15 years. ? Have at least a 30-pack-year history of smoking. A pack year is smoking an average of one pack of cigarettes a day for 1 year.  Yearly screening should continue until it has been 15 years since you quit.  Yearly screening should stop if you develop a health problem that would prevent you from having lung cancer treatment.  Breast Cancer  Practice breast self-awareness. This means understanding how your breasts normally appear and feel.  It also means doing regular breast self-exams. Let your health care provider know about any changes, no matter how small.  If you are in your 20s or 30s, you should have a clinical breast exam (CBE) by a health care provider every 1-3 years as part of a regular health exam.  If you are 50 or older, have a CBE every year.  Also consider having a breast X-ray (mammogram) every year.  If you have a family history of breast cancer, talk to your health care provider about genetic screening.  If you are at high risk for breast cancer, talk to your health care provider about having an MRI and a mammogram every year.  Breast cancer gene (BRCA) assessment is recommended for women who have family members with BRCA-related cancers. BRCA-related cancers include: ? Breast. ? Ovarian. ? Tubal. ? Peritoneal cancers.  Results of the assessment will determine the need  for genetic counseling and BRCA1 and BRCA2 testing.  Cervical Cancer Your health care provider may recommend that you be screened regularly for cancer of the pelvic organs (ovaries, uterus, and vagina). This screening involves a pelvic examination, including checking for microscopic changes to the surface of your cervix (Pap test). You may be encouraged to have this screening done every 3 years, beginning at age 50.  For women ages 27-65, health care providers may recommend pelvic exams and Pap testing every 3 years, or they may recommend the Pap and pelvic exam, combined with testing for human papilloma virus (HPV), every 5 years. Some types of HPV increase your risk of cervical cancer. Testing for HPV may also be done on women of any age with unclear Pap test results.  Other health care providers may not recommend any screening for nonpregnant women who are considered low risk for pelvic cancer and who do not have symptoms. Ask your health care provider if a screening pelvic exam is right for you.  If you have had past treatment for cervical cancer or a condition that could lead to cancer, you need Pap tests and screening for cancer for at least 20 years after your treatment. If Pap tests have been discontinued, your risk factors (such as having a new sexual partner) need to be reassessed to determine if screening should resume. Some women have medical problems that increase the chance of getting cervical cancer. In these cases, your health care provider may recommend more frequent screening and Pap tests.  Colorectal Cancer  This type of cancer can be detected and often prevented.  Routine colorectal cancer screening usually begins at 66 years of age and continues through 65 years of age.  Your health care provider may recommend screening at an earlier age if you have risk factors for colon cancer.  Your health care provider may also recommend using home test kits to check for hidden blood in  the stool.  A small camera at the end of a tube can be used to examine your colon directly (sigmoidoscopy or colonoscopy). This is done to check for the earliest forms of colorectal cancer.  Routine screening usually begins at age 40.  Direct examination of the colon should be repeated every 5-10 years through 65 years of age. However, you may need to be screened more often if early forms of precancerous polyps or small growths are found.  Skin Cancer  Check your skin from head to toe regularly.  Tell your health care provider about any new moles or changes in moles, especially if there is a change in a mole's shape or color.  Also tell your health care provider if you have a mole that is larger than the size of a pencil eraser.  Always use sunscreen. Apply sunscreen liberally and repeatedly throughout the day.  Protect yourself by wearing long sleeves, pants, a wide-brimmed hat, and sunglasses whenever you are outside.  Heart disease, diabetes, and high  blood pressure  High blood pressure causes heart disease and increases the risk of stroke. High blood pressure is more likely to develop in: ? People who have blood pressure in the high end of the normal range (130-139/85-89 mm Hg). ? People who are overweight or obese. ? People who are African American.  If you are 3-100 years of age, have your blood pressure checked every 3-5 years. If you are 49 years of age or older, have your blood pressure checked every year. You should have your blood pressure measured twice-once when you are at a hospital or clinic, and once when you are not at a hospital or clinic. Record the average of the two measurements. To check your blood pressure when you are not at a hospital or clinic, you can use: ? An automated blood pressure machine at a pharmacy. ? A home blood pressure monitor.  If you are between 62 years and 2 years old, ask your health care provider if you should take aspirin to prevent  strokes.  Have regular diabetes screenings. This involves taking a blood sample to check your fasting blood sugar level. ? If you are at a normal weight and have a low risk for diabetes, have this test once every three years after 65 years of age. ? If you are overweight and have a high risk for diabetes, consider being tested at a younger age or more often. Preventing infection Hepatitis B  If you have a higher risk for hepatitis B, you should be screened for this virus. You are considered at high risk for hepatitis B if: ? You were born in a country where hepatitis B is common. Ask your health care provider which countries are considered high risk. ? Your parents were born in a high-risk country, and you have not been immunized against hepatitis B (hepatitis B vaccine). ? You have HIV or AIDS. ? You use needles to inject street drugs. ? You live with someone who has hepatitis B. ? You have had sex with someone who has hepatitis B. ? You get hemodialysis treatment. ? You take certain medicines for conditions, including cancer, organ transplantation, and autoimmune conditions.  Hepatitis C  Blood testing is recommended for: ? Everyone born from [birth year] through 1964. ? Anyone with known risk factors for hepatitis C.  Sexually transmitted infections (STIs)  You should be screened for sexually transmitted infections (STIs) including gonorrhea and chlamydia if: ? You are sexually active and are younger than 65 years of age. ? You are older than 65 years of age and your health care provider tells you that you are at risk for this type of infection. ? Your sexual activity has changed since you were last screened and you are at an increased risk for chlamydia or gonorrhea. Ask your health care provider if you are at risk.  If you do not have HIV, but are at risk, it may be recommended that you take a prescription medicine daily to prevent HIV infection. This is called pre-exposure prophylaxis  (PrEP). You are considered at risk if: ? You are sexually active and do not regularly use condoms or know the HIV status of your partner(s). ? You take drugs by injection. ? You are sexually active with a partner who has HIV.  Talk with your health care provider about whether you are at high risk of being infected with HIV. If you choose to begin PrEP, you should first be tested for HIV. You should then be tested  every 3 months for as long as you are taking PrEP. Pregnancy  If you are premenopausal and you may become pregnant, ask your health care provider about preconception counseling.  If you may become pregnant, take 400 to 800 micrograms (mcg) of folic acid every day.  If you want to prevent pregnancy, talk to your health care provider about birth control (contraception). Osteoporosis and menopause  Osteoporosis is a disease in which the bones lose minerals and strength with aging. This can result in serious bone fractures. Your risk for osteoporosis can be identified using a bone density scan.  If you are 66 years of age or older, or if you are at risk for osteoporosis and fractures, ask your health care provider if you should be screened.  Ask your health care provider whether you should take a calcium or vitamin D supplement to lower your risk for osteoporosis.  Menopause may have certain physical symptoms and risks.  Hormone replacement therapy may reduce some of these symptoms and risks. Talk to your health care provider about whether hormone replacement therapy is right for you. Follow these instructions at home:  Schedule regular health, dental, and eye exams.  Stay current with your immunizations.  Do not use any tobacco products including cigarettes, chewing tobacco, or electronic cigarettes.  If you are pregnant, do not drink alcohol.  If you are breastfeeding, limit how much and how often you drink alcohol.  Limit alcohol intake to no more than 1 drink per day for  nonpregnant women. One drink equals 12 ounces of beer, 5 ounces of wine, or 1 ounces of hard liquor.  Do not use street drugs.  Do not share needles.  Ask your health care provider for help if you need support or information about quitting drugs.  Tell your health care provider if you often feel depressed.  Tell your health care provider if you have ever been abused or do not feel safe at home. This information is not intended to replace advice given to you by your health care provider. Make sure you discuss any questions you have with your health care provider. Document Released: 05/14/2011 Document Revised: 04/05/2016 Document Reviewed: 08/02/2015 Elsevier Interactive Patient Education  [employer].

## 2017-10-16 NOTE — Progress Notes (Signed)
Patient presents to clinic today to follow-up on chronic issues/establish care.  SUBJECTIVE: PMH:  Pt is a 66 yo with pmh sig for depression and genital herpes.  She [place] by Dr. Raliegh Ip [place]  [place] by [facility].  Last physical February 12, 2017  Depression: -in remission -has continued to take Lexapro 20 mg since 2003 -at the time of dx pt found out her husband had prostate ca. -Pt endorses good mood, sleep, energy, and concentration  HSV: -genital herpes -last outbreak ~20 yrs ago -takes Acyclovir 200mg  3-4 x/wk  Sciatica/back pain: -doing well -in the past had to take flexeril daily to [facility] -took one last night as had muscle spasms in back.  Allergies:   Sulfa-unsure of rxn as happened in childhood.  Thinks had hives  PSurgHx: -cholecystectomy 2012  Social history: Patient is married.  She retired from [employer].  Patient denies tobacco, alcohol, drug use.  Patient recently moved back to [place] beach.  Family medical history: Mom-deceased, depression, DM, HLD, HTN, obesity, dementia Dad-deceased at age 65 2/2 stroke, COPD, heart disease  Health Maintenance: Dental --[facility] --[employer] --tetanus, Pneumovax, flu shot done in 2018 Colonoscopy --due for repeat colonoscopy in 2021 Mammogram --2017 PAP --2016 Bone Density --due for   Past Medical History:  Diagnosis Date  . ABDOMINAL PAIN 05/01/2010  . DEPRESSION 07/21/2007  . DIVERTICULOSIS, COLON 07/26/2008  . Headache(784.0) 07/26/2008    Past Surgical History:  Procedure Laterality Date  . ERCP  august 2012  . LAPAROSCOPIC CHOLECYSTECTOMY  08/14/2011    Current Outpatient Medications on File Prior to Visit  Medication Sig Dispense Refill  . acyclovir (ZOVIRAX) 200 MG capsule Take 1 capsule (200 mg total) by mouth 2 (two) times daily. 60 capsule 6  . cyclobenzaprine (FLEXERIL) 10 MG tablet Take 10  mg by mouth 3 (three) times daily as needed for muscle spasms.    Marland Kitchen escitalopram (LEXAPRO) 20 MG tablet TAKE 1 TABLET EVERY DAY 30 tablet 0   No current facility-administered medications on file prior to visit.     Allergies  Allergen Reactions  . Sulfamethoxazole Hives    Happened in childhood, does not remember reaction.    Family History  Problem Relation Age of Onset  . Diabetes Mother   . Heart disease Mother   . Obesity Mother   . Dementia Mother   . Stroke Mother 46  . Heart disease Father   . Stroke Father     Social History   Socioeconomic History  . Marital status: Married    Spouse name: Not on file  . Number of children: Not on file  . Years of education: Not on file  . Highest education level: Not on file  Social Needs  . Financial resource strain: Not on file  . Food insecurity - worry: Not on file  . Food insecurity - inability: Not on file  . Transportation needs - medical: Not on file  . Transportation needs - non-medical: Not on file  Occupational History  . Not on file  Tobacco Use  . Smoking status: Never Smoker  . Smokeless tobacco: Never Used  Substance and Sexual Activity  . Alcohol use: No  . Drug use: No  . Sexual activity: Not on file  Other Topics Concern  . Not on file  Social History Narrative  . Not on file    ROS General: Denies fever,  chills, night sweats, changes in weight, changes in appetite HEENT: Denies headaches, ear pain, changes in vision, rhinorrhea, sore throat CV: Denies CP, palpitations, SOB, orthopnea Pulm: Denies SOB, cough, wheezing GI: Denies abdominal pain, nausea, vomiting, diarrhea, constipation GU: Denies dysuria, hematuria, frequency, vaginal discharge Msk: Denies muscle cramps, joint pains Neuro: Denies weakness, numbness, tingling Skin: Denies rashes, bruising Psych: Denies depression, anxiety, hallucinations  BP 100/70 (BP Location: Right Arm, Patient Position: Sitting, Cuff Size: Normal)   Pulse  72   Temp (!) 97.4 F (36.3 C) (Oral)   Ht 5' 5.75" (1.67 m)   Wt 150 lb 11.2 oz (68.4 kg)   BMI 24.51 kg/m   Physical Exam Gen. Pleasant, well developed, well-nourished, in NAD HEENT - [place]/AT, PERRL, no scleral icterus, no nasal drainage, pharynx without erythema or exudate. Neck: No JVD, no thyromegaly, no carotid bruits Lungs: no use of accessory muscles, CTAB, no wheezes, rales or rhonchi Cardiovascular: RRR, No r/g/m, no peripheral edema Abdomen: BS present, soft, nontender,nondistended, no hepatosplenomegaly Musculoskeletal: No deformities, moves all four extremities, no cyanosis or clubbing, normal tone Neuro:  A&Ox3, CN II-XII intact, normal gait Skin:  Warm, dry, intact, no lesions Psych: normal affect, mood appropriate  No results found for this or any previous visit (from the past 2160 hour(s)).  Assessment/Plan: Depression, major, single episode, complete remission ([place]) -Will consider weaning pt off Lexapro -For now, continue lexapro 20 mg  HSV infection -stable.  No outbreaks in 20 yrs -continue Acyclovir 200 mg 3-4 x/wk  Postmenopausal -Will order bone density for osteoporosis screen as age 49.  Encounter to establish care -We reviewed the PMH, PSH, FH, SH, Meds and Allergies. -We provided refills for any medications we will prescribe as needed. -We addressed current concerns per orders and patient instructions. -We have asked for records for pertinent exams, studies, vaccines and notes from previous providers. -We have advised patient to follow up per instructions below.   F/u prn.  Next CPE on or after February 13, 2018.  Pt to call office when needs refills on Meds.

## 2018-03-14 DIAGNOSIS — H02413 Mechanical ptosis of bilateral eyelids: Secondary | ICD-10-CM | POA: Diagnosis not present

## 2018-03-14 DIAGNOSIS — Z01818 Encounter for other preprocedural examination: Secondary | ICD-10-CM | POA: Diagnosis not present

## 2018-03-17 DIAGNOSIS — H02412 Mechanical ptosis of left eyelid: Secondary | ICD-10-CM | POA: Diagnosis not present

## 2018-03-17 DIAGNOSIS — H02413 Mechanical ptosis of bilateral eyelids: Secondary | ICD-10-CM | POA: Diagnosis not present

## 2018-03-17 DIAGNOSIS — H02411 Mechanical ptosis of right eyelid: Secondary | ICD-10-CM | POA: Diagnosis not present

## 2018-03-17 DIAGNOSIS — H5789 Other specified disorders of eye and adnexa: Secondary | ICD-10-CM | POA: Diagnosis not present

## 2018-05-20 DIAGNOSIS — H2513 Age-related nuclear cataract, bilateral: Secondary | ICD-10-CM | POA: Diagnosis not present

## 2018-07-07 ENCOUNTER — Other Ambulatory Visit: Payer: Self-pay | Admitting: Family Medicine

## 2018-07-09 NOTE — Telephone Encounter (Signed)
Pt should schedule an appt to be seen.

## 2018-07-10 DIAGNOSIS — Z23 Encounter for immunization: Secondary | ICD-10-CM | POA: Diagnosis not present

## 2018-08-06 ENCOUNTER — Telehealth: Payer: Self-pay | Admitting: Family Medicine

## 2018-08-06 NOTE — Telephone Encounter (Signed)
Copied from [id number]. Topic: Quick Communication - Rx Refill/Question >> Aug 06, 2018  9:35 AM Ahmed Prima L wrote: Medication: cyclobenzaprine (FLEXERIL) 10 MG tablet  Has the patient contacted their pharmacy? Yes on Friday, not getting a response from the office. (Agent: If no, request that the patient contact the pharmacy for the refill.) (Agent: If yes, when and what did the pharmacy advise?)  Preferred Pharmacy (with phone number or street name): [id number] [address], [place] [address]    Agent: Please be advised that RX refills may take up to 3 business days. We ask that you follow-up with your pharmacy.

## 2018-08-06 NOTE — Telephone Encounter (Signed)
Spoke with pt advised that she needs to schedule a f/u appointment to see Dr Cain Saupe before her Rx refill, pt stated that she will call back to schedule the appointment.

## 2018-08-06 NOTE — Telephone Encounter (Signed)
Cyclobenzaprine 10 mg  refill Last Refill: ? #  ? Last OV: 10/16/17 PCP: [phone]  Historical provider for this med with no dates for refill or amounts provided. Pt did not returned for follow up visit around March 2019.

## 2018-08-07 ENCOUNTER — Ambulatory Visit (INDEPENDENT_AMBULATORY_CARE_PROVIDER_SITE_OTHER): Payer: Medicare Other | Admitting: Family Medicine

## 2018-08-07 ENCOUNTER — Encounter: Payer: Self-pay | Admitting: Family Medicine

## 2018-08-07 VITALS — BP 110/80 | HR 94 | Temp 98.5°F | Wt 156.0 lb

## 2018-08-07 DIAGNOSIS — Z8659 Personal history of other mental and behavioral disorders: Secondary | ICD-10-CM

## 2018-08-07 DIAGNOSIS — Z8739 Personal history of other diseases of the musculoskeletal system and connective tissue: Secondary | ICD-10-CM

## 2018-08-07 DIAGNOSIS — Z87828 Personal history of other (healed) physical injury and trauma: Secondary | ICD-10-CM

## 2018-08-07 MED ORDER — ESCITALOPRAM OXALATE 20 MG PO TABS: ORAL_TABLET | ORAL | 2 refills | Status: DC

## 2018-08-07 MED ORDER — CYCLOBENZAPRINE HCL 10 MG PO TABS: 10.0000 mg | ORAL_TABLET | Freq: Three times a day (TID) | ORAL | 5 refills | Status: DC | PRN

## 2018-08-07 NOTE — Patient Instructions (Signed)

## 2018-08-07 NOTE — Progress Notes (Signed)
Subjective:    Patient ID: [name], female    DOB: Feb 15, 1951, 67 y.o.   MRN: [id number]  No chief complaint on file.   HPI Patient was seen today for f/u.  Pt endorses recent episode of low back pain without sciatica.  Pt will throw out her back every now and then.  Takes Flexeril prn when an episode happens.  Denies loss of bowel or bladder.  Pt needs refill on lexapro 20 mg.  Has been taking for years.  Stable.  Pt notes insomnia.  Has been taking Zzzquil.  Notes mind feels like it is racing at times.  Past Medical History:  Diagnosis Date  . ABDOMINAL PAIN 05/01/2010  . DEPRESSION 07/21/2007  . DIVERTICULOSIS, COLON 07/26/2008  . Headache(784.0) 07/26/2008    Allergies  Allergen Reactions  . Sulfamethoxazole Hives    Happened in childhood, does not remember reaction.    ROS General: Denies fever, chills, night sweats, changes in weight, changes in appetite  +insomnia HEENT: Denies headaches, ear pain, changes in vision, rhinorrhea, sore throat CV: Denies CP, palpitations, SOB, orthopnea Pulm: Denies SOB, cough, wheezing GI: Denies abdominal pain, nausea, vomiting, diarrhea, constipation GU: Denies dysuria, hematuria, frequency, vaginal discharge Msk: Denies muscle cramps, joint pains  +back pain Neuro: Denies weakness, numbness, tingling Skin: Denies rashes, bruising Psych: Denies anxiety, hallucinations +h/o depression     Objective:    Blood pressure 110/80, pulse 94, temperature 98.5 F (36.9 C), temperature source Oral, weight 156 lb (70.8 kg), SpO2 97 %.   Gen. Pleasant, well-nourished, in no distress, normal affect  HEENT: [place]/AT, face symmetric, no scleral icterus, PERRLA, nares patent without drainage Lungs: no accessory muscle use, CTAB, no wheezes or rales Cardiovascular: RRR, no m/r/g, no peripheral edema Musculoskeletal: No TTP of back. No deformities, no cyanosis or clubbing, normal tone Neuro:  A&Ox3, CN II-XII intact, normal gait   Wt  Readings from Last 3 Encounters:  08/07/18 156 lb (70.8 kg)  10/16/17 150 lb 11.2 oz (68.4 kg)  02/11/12 157 lb (71.2 kg)    Lab Results  Component Value Date   WBC 4.8 08/08/2011   HGB 13.3 08/08/2011   HCT 39.6 08/08/2011   PLT 230 08/08/2011   GLUCOSE 88 08/08/2011   CHOL 103 07/21/2008   TRIG 68 07/21/2008   HDL 40.0 07/21/2008   LDLCALC 49 07/21/2008   ALT 10 08/08/2011   AST 19 08/08/2011   NA 142 08/08/2011   K 4.3 08/08/2011   CL 104 08/08/2011   CREATININE 0.60 08/08/2011   BUN 10 08/08/2011   CO2 30 08/08/2011   TSH 1.58 07/21/2008   INR 0.97 06/29/2011    Assessment/Plan:  History of depression  - Plan: escitalopram (LEXAPRO) 20 MG tablet  H/O strain of back  - Plan: cyclobenzaprine (FLEXERIL) 10 MG tablet  F/u prn  Grier Mitts, MD

## 2019-01-23 ENCOUNTER — Other Ambulatory Visit: Payer: Self-pay | Admitting: Family Medicine

## 2019-04-03 ENCOUNTER — Telehealth: Payer: Medicare Other | Admitting: Nurse Practitioner

## 2019-04-03 DIAGNOSIS — R05 Cough: Secondary | ICD-10-CM | POA: Diagnosis not present

## 2019-04-03 DIAGNOSIS — R059 Cough, unspecified: Secondary | ICD-10-CM

## 2019-04-03 MED ORDER — BENZONATATE 100 MG PO CAPS: 100.0000 mg | ORAL_CAPSULE | Freq: Three times a day (TID) | ORAL | 0 refills | Status: DC | PRN

## 2019-04-03 MED ORDER — ALBUTEROL SULFATE HFA 108 (90 BASE) MCG/ACT IN AERS: 2.0000 | INHALATION_SPRAY | Freq: Four times a day (QID) | RESPIRATORY_TRACT | 0 refills | Status: DC | PRN

## 2019-04-03 NOTE — Progress Notes (Signed)
We are sorry that you are not feeling well.  Here is how we plan to help!  Based on your presentation I believe you most likely have A cough due to a virus.  This is called viral bronchitis and is best treated by rest, plenty of fluids and control of the cough.  You may use Ibuprofen or Tylenol as directed to help your symptoms.     In addition you may use A prescription cough medication called Tessalon Perles 100mg . You may take 1-2 capsules every 8 hours as needed for your cough.  Albuterol inhaler 2 puffs q4hours prn  From your responses in the eVisit questionnaire you describe inflammation in the upper respiratory tract which is causing a significant cough.  This is commonly called Bronchitis and has four common causes:    Allergies  Viral Infections  Acid Reflux  Bacterial Infection Allergies, viruses and acid reflux are treated by controlling symptoms or eliminating the cause. An example might be a cough caused by taking certain blood pressure medications. You stop the cough by changing the medication. Another example might be a cough caused by acid reflux. Controlling the reflux helps control the cough.  USE OF BRONCHODILATOR ("RESCUE") INHALERS: There is a risk from using your bronchodilator too frequently.  The risk is that over-reliance on a medication which only relaxes the muscles surrounding the breathing tubes can reduce the effectiveness of medications prescribed to reduce swelling and congestion of the tubes themselves.  Although you feel brief relief from the bronchodilator inhaler, your asthma may actually be worsening with the tubes becoming more swollen and filled with mucus.  This can delay other crucial treatments, such as oral steroid medications. If you need to use a bronchodilator inhaler daily, several times per day, you should discuss this with your provider.  There are probably better treatments that could be used to keep your asthma under control.     HOME  CARE . Only take medications as instructed by your medical team. . Complete the entire course of an antibiotic. . Drink plenty of fluids and get plenty of rest. . Avoid close contacts especially the very young and the elderly . Cover your mouth if you cough or cough into your sleeve. . Always remember to wash your hands . A steam or ultrasonic humidifier can help congestion.   GET HELP RIGHT AWAY IF: . You develop worsening fever. . You become short of breath . You cough up blood. . Your symptoms persist after you have completed your treatment plan MAKE SURE YOU   Understand these instructions.  Will watch your condition.  Will get help right away if you are not doing well or get worse.  Your e-visit answers were reviewed by a board certified advanced clinical practitioner to complete your personal care plan.  Depending on the condition, your plan could have included both over the counter or prescription medications. If there is a problem please reply  once you have received a response from your provider. Your safety is important to [place].  If you have drug allergies check your prescription carefully.    You can use MyChart to ask questions about today's visit, request a non-urgent call back, or ask for a work or school excuse for 24 hours related to this e-Visit. If it has been greater than 24 hours you will need to follow up with your provider, or enter a new e-Visit to address those concerns. You will get an e-mail in the next two days  asking about your experience.  I hope that your e-visit has been valuable and will speed your recovery. Thank you for using e-visits.  5-10 minutes spent reviewing and documenting in chart.

## 2019-04-28 ENCOUNTER — Encounter: Payer: Self-pay | Admitting: Family Medicine

## 2019-05-01 ENCOUNTER — Other Ambulatory Visit: Payer: Self-pay

## 2019-05-01 MED ORDER — TRIAMCINOLONE ACETONIDE 0.1 % EX CREA: 1.0000 "application " | TOPICAL_CREAM | Freq: Two times a day (BID) | CUTANEOUS | 1 refills | Status: DC

## 2019-05-01 NOTE — Telephone Encounter (Signed)
Rx for Triamcinolone Cream sent to pt pharmacy

## 2019-06-04 ENCOUNTER — Other Ambulatory Visit: Payer: Self-pay | Admitting: Nurse Practitioner

## 2019-06-05 ENCOUNTER — Other Ambulatory Visit: Payer: Self-pay | Admitting: Family Medicine

## 2019-06-05 NOTE — Telephone Encounter (Signed)
Please advises if refill is appropriate. This was not prescribed by you

## 2019-06-08 ENCOUNTER — Other Ambulatory Visit: Payer: Self-pay | Admitting: Family Medicine

## 2019-06-08 DIAGNOSIS — Z8659 Personal history of other mental and behavioral disorders: Secondary | ICD-10-CM

## 2019-06-08 NOTE — Telephone Encounter (Signed)
Dr. Banks please advise  

## 2019-06-22 ENCOUNTER — Other Ambulatory Visit: Payer: Self-pay | Admitting: Family Medicine

## 2019-06-22 DIAGNOSIS — Z8659 Personal history of other mental and behavioral disorders: Secondary | ICD-10-CM

## 2019-06-23 NOTE — Telephone Encounter (Signed)
Please advise 

## 2019-07-06 ENCOUNTER — Telehealth: Payer: Self-pay

## 2019-07-06 NOTE — Telephone Encounter (Signed)
Pt is scheduled to see Dr Volanda Napoleon on 07/23/2019 for a CPE

## 2019-07-19 ENCOUNTER — Other Ambulatory Visit: Payer: Self-pay | Admitting: Family Medicine

## 2019-07-23 ENCOUNTER — Encounter: Payer: Self-pay | Admitting: Gastroenterology

## 2019-07-23 ENCOUNTER — Other Ambulatory Visit: Payer: Self-pay

## 2019-07-23 ENCOUNTER — Encounter: Payer: Self-pay | Admitting: Family Medicine

## 2019-07-23 ENCOUNTER — Ambulatory Visit (INDEPENDENT_AMBULATORY_CARE_PROVIDER_SITE_OTHER): Payer: Medicare Other | Admitting: Family Medicine

## 2019-07-23 VITALS — BP 110/78 | HR 98 | Temp 98.0°F | Wt 146.0 lb

## 2019-07-23 DIAGNOSIS — Z1159 Encounter for screening for other viral diseases: Secondary | ICD-10-CM | POA: Diagnosis not present

## 2019-07-23 DIAGNOSIS — Z13220 Encounter for screening for lipoid disorders: Secondary | ICD-10-CM | POA: Diagnosis not present

## 2019-07-23 DIAGNOSIS — Z1211 Encounter for screening for malignant neoplasm of colon: Secondary | ICD-10-CM

## 2019-07-23 DIAGNOSIS — Z780 Asymptomatic menopausal state: Secondary | ICD-10-CM

## 2019-07-23 DIAGNOSIS — Z0000 Encounter for general adult medical examination without abnormal findings: Secondary | ICD-10-CM | POA: Diagnosis not present

## 2019-07-23 DIAGNOSIS — Z23 Encounter for immunization: Secondary | ICD-10-CM | POA: Diagnosis not present

## 2019-07-23 LAB — BASIC METABOLIC PANEL
BUN: 15 mg/dL (ref 6–23)
CO2: 29 mEq/L (ref 19–32)
Calcium: 9.5 mg/dL (ref 8.4–10.5)
Chloride: 105 mEq/L (ref 96–112)
Creatinine, Ser: 0.86 mg/dL (ref 0.40–1.20)
GFR: 65.79 mL/min (ref >60.00)
Glucose, Bld: 98 mg/dL (ref 70–99)
Potassium: 4.3 mEq/L (ref 3.5–5.1)
Sodium: 142 mEq/L (ref 135–145)

## 2019-07-23 LAB — LIPID PANEL
Cholesterol: 151 mg/dL (ref 0–200)
HDL: 61.3 mg/dL (ref >39.00)
LDL Cholesterol: 71 mg/dL (ref 0–99)
NonHDL: 89.74
Total CHOL/HDL Ratio: 2
Triglycerides: 92 mg/dL (ref 0.0–149.0)
VLDL: 18.4 mg/dL (ref 0.0–40.0)

## 2019-07-23 NOTE — Progress Notes (Signed)
Subjective:     [name] is a 68 y.o. female and is here for a comprehensive physical exam. The patient reports no problems.  Pt denies falls in the last year, issues with memory, hearing, or vision.  Pt able to manage finances without issue.  Pt states mood, sleep, and energy are good on Lexapro.   Social History   Socioeconomic History  . Marital status: Married    Spouse name: Not on file  . Number of children: Not on file  . Years of education: Not on file  . Highest education level: Not on file  Occupational History  . Not on file  Social Needs  . Financial resource strain: Not on file  . Food insecurity    Worry: Not on file    Inability: Not on file  . Transportation needs    Medical: Not on file    Non-medical: Not on file  Tobacco Use  . Smoking status: Never Smoker  . Smokeless tobacco: Never Used  Substance and Sexual Activity  . Alcohol use: No  . Drug use: No  . Sexual activity: Not on file  Lifestyle  . Physical activity    Days per week: Not on file    Minutes per session: Not on file  . Stress: Not on file  Relationships  . Social Herbalist on phone: Not on file    Gets together: Not on file    Attends religious service: Not on file    Active member of club or organization: Not on file    Attends meetings of clubs or organizations: Not on file    Relationship status: Not on file  . Intimate partner violence    Fear of current or ex partner: Not on file    Emotionally abused: Not on file    Physically abused: Not on file    Forced sexual activity: Not on file  Other Topics Concern  . Not on file  Social History Narrative  . Not on file   Health Maintenance  Topic Date Due  . Hepatitis C Screening  1950-11-21  . COLONOSCOPY  06/10/2002  . MAMMOGRAM  10/30/2013  . DEXA SCAN  06/10/2017  . PNA vac Low Risk Adult (2 of 2 - PPSV23) 08/20/2018  . INFLUENZA VACCINE  06/13/2019  . TETANUS/TDAP  02/13/2027    The following  portions of the patient's history were reviewed and updated as appropriate: allergies, current medications, past family history, past medical history, past social history, past surgical history and problem list.  Review of Systems A comprehensive review of systems was negative.   Objective:    BP 110/78   Pulse 98   Temp 98 F (36.7 C) (Oral)   Wt 146 lb (66.2 kg)   SpO2 96%   BMI 23.74 kg/m  General appearance: alert, cooperative and no distress Head: Normocephalic, without obvious abnormality, atraumatic Eyes: conjunctivae/corneas clear. PERRL, EOM's intact. Fundi benign. Ears: normal TM's and external ear canals both ears Nose: Nares normal. Septum midline. Mucosa normal. No drainage or sinus tenderness. Throat: lips, mucosa, and tongue normal; teeth and gums normal Neck: no adenopathy, no carotid bruit, no JVD, supple, symmetrical, trachea midline and thyroid not enlarged, symmetric, no tenderness/mass/nodules Lungs: clear to auscultation bilaterally Heart: regular rate and rhythm, S1, S2 normal, no murmur, click, rub or gallop Abdomen: soft, non-tender; bowel sounds normal; no masses,  no organomegaly Extremities: extremities normal, atraumatic, no cyanosis or edema Pulses: 2+ and  symmetric Skin: Skin color, texture, turgor normal. No rashes or lesions Lymph nodes: Cervical, supraclavicular, and axillary nodes normal. Neurologic: Alert and oriented X 3, normal strength and tone. Normal symmetric reflexes. Normal coordination and gait    Assessment:    Healthy female exam.      Plan:     Anticipatory guidance given including wearing seatbelts, smoke detectors in the home, increasing physical activity, increasing p.o. intake of water and vegetables. -will obtain labs -pt to schedule mammogram -given handouts.  Given info for HCPOA and living will. See After Visit Summary for Counseling Recommendations    Postmenopausal  - Plan: DG Bone Density  Colon cancer screening   - Plan: Ambulatory referral to Gastroenterology  Need for influenza vaccination  - Plan: Flu Vaccine QUAD High Dose(Fluad)  Need for pneumococcal vaccine  - Plan: Pneumococcal polysaccharide vaccine 23-valent greater than or equal to 2yo subcutaneous/IM  Need for hepatitis C screening test  - Plan: Hep C Antibody  Screening for cholesterol level  - Plan: Lipid panel  F/u prn  Grier Mitts, MD

## 2019-07-23 NOTE — Patient Instructions (Signed)
Preventive Care 67 Years and Older, Female Preventive care refers to lifestyle choices and visits with your health care provider that can promote health and wellness. This includes:  A yearly physical exam. This is also called an annual well check.  Regular dental and eye exams.  Immunizations.  Screening for certain conditions.  Healthy lifestyle choices, such as diet and exercise. What can I expect for my preventive care visit? Physical exam Your health care provider will check:  Height and weight. These may be used to calculate body mass index (BMI), which is a measurement that tells if you are at a healthy weight.  Heart rate and blood pressure.  Your skin for abnormal spots. Counseling Your health care provider may ask you questions about:  Alcohol, tobacco, and drug use.  Emotional well-being.  Home and relationship well-being.  Sexual activity.  Eating habits.  History of falls.  Memory and ability to understand (cognition).  Work and work Statistician.  Pregnancy and menstrual history. What immunizations do I need?  Influenza (flu) vaccine  This is recommended every year. Tetanus, diphtheria, and pertussis (Tdap) vaccine  You may need a Td booster every 10 years. Varicella (chickenpox) vaccine  You may need this vaccine if you have not already been vaccinated. Zoster (shingles) vaccine  You may need this after age 33. Pneumococcal conjugate (PCV13) vaccine  One dose is recommended after age 33. Pneumococcal polysaccharide (PPSV23) vaccine  One dose is recommended after age 72. Measles, mumps, and rubella (MMR) vaccine  You may need at least one dose of MMR if you were born in 1957 or later. You may also need a second dose. Meningococcal conjugate (MenACWY) vaccine  You may need this if you have certain conditions. Hepatitis A vaccine  You may need this if you have certain conditions or if you travel or work in places where you may be exposed  to hepatitis A. Hepatitis B vaccine  You may need this if you have certain conditions or if you travel or work in places where you may be exposed to hepatitis B. Haemophilus influenzae type b (Hib) vaccine  You may need this if you have certain conditions. You may receive vaccines as individual doses or as more than one vaccine together in one shot (combination vaccines). Talk with your health care provider about the risks and benefits of combination vaccines. What tests do I need? Blood tests  Lipid and cholesterol levels. These may be checked every 5 years, or more frequently depending on your overall health.  Hepatitis C test.  Hepatitis B test. Screening  Lung cancer screening. You may have this screening every year starting at age 67 if you have a 30-pack-year history of smoking and currently smoke or have quit within the past 15 years.  Colorectal cancer screening. All adults should have this screening starting at age 676 and continuing until age 67. Your health care provider may recommend screening at age 67 if you are at increased risk. You will have tests every 1-10 years, depending on your results and the type of screening test.  Diabetes screening. This is done by checking your blood sugar (glucose) after you have not eaten for a while (fasting). You may have this done every 1-3 years.  Mammogram. This may be done every 1-2 years. Talk with your health care provider about how often you should have regular mammograms.  BRCA-related cancer screening. This may be done if you have a family history of breast, ovarian, tubal, or peritoneal cancers.  Other tests  Sexually transmitted disease (STD) testing.  Bone density scan. This is done to screen for osteoporosis. You may have this done starting at age 67. Follow these instructions at home: Eating and drinking  Eat a diet that includes fresh fruits and vegetables, whole grains, lean protein, and low-fat dairy products. Limit  your intake of foods with high amounts of sugar, saturated fats, and salt.  Take vitamin and mineral supplements as recommended by your health care provider.  Do not drink alcohol if your health care provider tells you not to drink.  If you drink alcohol: ? Limit how much you have to 0-1 drink a day. ? Be aware of how much alcohol is in your drink. In the [place] one drink equals one 12 oz bottle of beer (355 mL), one 5 oz glass of wine (148 mL), or one 1 oz glass of hard liquor (44 mL). Lifestyle  Take daily care of your teeth and gums.  Stay active. Exercise for at least 30 minutes on 5 or more days each week.  Do not use any products that contain nicotine or tobacco, such as cigarettes, e-cigarettes, and chewing tobacco. If you need help quitting, ask your health care provider.  If you are sexually active, practice safe sex. Use a condom or other form of protection in order to prevent STIs (sexually transmitted infections).  Talk with your health care provider about taking a low-dose aspirin or statin. What's next?  Go to your health care provider once a year for a well check visit.  Ask your health care provider how often you should have your eyes and teeth checked.  Stay up to date on all vaccines. This information is not intended to replace advice given to you by your health care provider. Make sure you discuss any questions you have with your health care provider. Document Released: 11/25/2015 Document Revised: 10/23/2018 Document Reviewed: 10/23/2018 Elsevier Patient Education  2020 [place] Screening  Colorectal cancer screening is a group of tests that are used to check for colorectal cancer before symptoms develop. Colorectal refers to the colon and rectum. The colon and rectum are located at the end of the digestive tract and carry bowel movements out of the body. Who should have screening? All adults starting at age 67 until age 67 should have  screening. Your health care provider may recommend screening at age 67. You will have tests every 1-10 years, depending on your results and the type of screening test. You may have screening tests starting at an earlier age, or more frequently than other people, if you have any of the following risk factors:  A personal or family history of colorectal cancer or abnormal growths (polyps).  Inflammatory bowel disease, such as ulcerative colitis or Crohn's disease.  A history of having radiation treatment to the abdomen or pelvic area for cancer.  Colorectal cancer symptoms, such as changes in bowel habits or blood in your stool.  A type of colon cancer syndrome that is passed from parent to child (hereditary), such as: ? Lynch syndrome. ? Familial adenomatous polyposis. ? Turcot syndrome. ? Peutz-Jeghers syndrome. Screening recommendations for adults who are 15-60 years old vary depending on health. How is screening done? There are several types of colorectal screening tests. You may have one or more of the following:  Guaiac-based fecal occult blood testing. For this test, a stool (feces) sample is checked for hidden (occult) blood, which could be a sign of colorectal  cancer.  Fecal immunochemical test (FIT). For this test, a stool sample is checked for blood, which could be a sign of colorectal cancer.  Stool DNA test. For this test, a stool sample is checked for blood and changes in DNA that could lead to colorectal cancer.  Sigmoidoscopy. During this test, a thin, flexible tube with a camera on the end (sigmoidoscope) is used to examine the rectum and the lower colon.  Colonoscopy. During this test, a long, flexible tube with a camera on the end (colonoscope) is used to examine the entire colon and rectum. With a colonoscopy, it is possible to take a sample of tissue (biopsy) and remove small polyps during the test.  Virtual colonoscopy. Instead of a colonoscope, this type of  colonoscopy uses X-rays (CT scan) and computers to produce images of the colon and rectum. What are the benefits of screening? Screening reduces your risk for colorectal cancer and can help identify cancer at an early stage, when the cancer can be removed or treated more easily. It is common for polyps to form in the lining of the colon, especially as you age. These polyps may be cancerous or become cancerous over time. Screening can identify these polyps. What are the risks of screening? Each screening test may have different risks.  Stool sample tests have fewer risks than other types of screening tests. However, you may need more tests to confirm results from a stool sample test.  Screening tests that involve X-rays expose you to low levels of radiation, which may slightly increase your cancer risk. The benefit of detecting cancer outweighs the slight increase in risk.  Screening tests such as sigmoidoscopy and colonoscopy may place you at risk for bleeding, intestinal damage, infection, or a reaction to medicines given during the exam. Talk with your health care provider to understand your risk for colorectal cancer and to make a screening plan that is right for you. Questions to ask your health care provider  When should I start colorectal cancer screening?  What is my risk for colorectal cancer?  How often do I need screening?  Which screening tests do I need?  How do I get my test results?  What do my results mean? Where to find more information Learn more about colorectal cancer screening from:  [employer]: www.cancer.org  [employer]: www.cancer.gov Summary  Colorectal cancer screening is a group of tests used to check for colorectal cancer before symptoms develop.  Screening reduces your risk for colorectal cancer and can help identify cancer at an early stage, when the cancer can be removed or treated more easily.  All adults starting  at age 52 until age 72 should have screening. Your health care provider may recommend screening at age 67.  You may have screening tests starting at an earlier age, or more frequently than other people, if you have certain risk factors.  Talk with your health care provider to understand your risk for colorectal cancer and to make a screening plan that is right for you. This information is not intended to replace advice given to you by your health care provider. Make sure you discuss any questions you have with your health care provider. Document Released: 04/18/2010 Document Revised: 02/18/2019 Document Reviewed: 07/31/2017 Elsevier Patient Education  2020 [employer].

## 2019-07-24 LAB — HEPATITIS C ANTIBODY
Hepatitis C Ab: NONREACTIVE
SIGNAL TO CUT-OFF: 0.01 (ref <1.00)

## 2019-08-05 ENCOUNTER — Other Ambulatory Visit: Payer: Self-pay | Admitting: Nurse Practitioner

## 2019-08-06 ENCOUNTER — Other Ambulatory Visit: Payer: Self-pay | Admitting: Nurse Practitioner

## 2019-08-11 ENCOUNTER — Other Ambulatory Visit: Payer: Self-pay | Admitting: Family Medicine

## 2019-08-11 DIAGNOSIS — Z1231 Encounter for screening mammogram for malignant neoplasm of breast: Secondary | ICD-10-CM

## 2019-08-19 ENCOUNTER — Ambulatory Visit (AMBULATORY_SURGERY_CENTER): Payer: Self-pay | Admitting: *Deleted

## 2019-08-19 ENCOUNTER — Other Ambulatory Visit: Payer: Self-pay

## 2019-08-19 VITALS — Temp 97.6°F | Ht 66.0 in | Wt 149.4 lb

## 2019-08-19 DIAGNOSIS — Z1211 Encounter for screening for malignant neoplasm of colon: Secondary | ICD-10-CM

## 2019-08-19 MED ORDER — NA SULFATE-K SULFATE-MG SULF 17.5-3.13-1.6 GM/177ML PO SOLN: 1.0000 | Freq: Once | ORAL | 0 refills | Status: AC

## 2019-08-19 NOTE — Progress Notes (Signed)

## 2019-09-01 ENCOUNTER — Telehealth: Payer: Self-pay

## 2019-09-01 NOTE — Telephone Encounter (Signed)
Covid-19 screening questions   Do you now or have you had a fever in the last 14 days? NO   Do you have any respiratory symptoms of shortness of breath or cough now or in the last 14 days? NO  Do you have any family members or close contacts with diagnosed or suspected Covid-19 in the past 14 days? NO  Have you been tested for Covid-19 and found to be positive? NO        

## 2019-09-02 ENCOUNTER — Other Ambulatory Visit: Payer: Self-pay

## 2019-09-02 ENCOUNTER — Ambulatory Visit (AMBULATORY_SURGERY_CENTER): Payer: Medicare Other | Admitting: Gastroenterology

## 2019-09-02 ENCOUNTER — Encounter: Payer: Self-pay | Admitting: Gastroenterology

## 2019-09-02 VITALS — BP 121/61 | HR 75 | Temp 97.8°F | Resp 17 | Ht 66.0 in | Wt 149.4 lb

## 2019-09-02 DIAGNOSIS — Z1211 Encounter for screening for malignant neoplasm of colon: Secondary | ICD-10-CM

## 2019-09-02 DIAGNOSIS — Z86010 Personal history of colonic polyps: Secondary | ICD-10-CM | POA: Diagnosis not present

## 2019-09-02 MED ORDER — SODIUM CHLORIDE 0.9 % IV SOLN: 500.0000 mL | Freq: Once | INTRAVENOUS | Status: DC

## 2019-09-02 NOTE — Progress Notes (Signed)
Report given to PACU, vss 

## 2019-09-02 NOTE — Progress Notes (Signed)
Pt's states no medical or surgical changes since previsit or office visit. 

## 2019-09-02 NOTE — Progress Notes (Signed)
Temperature taken by K.A., VS taken by C.W. 

## 2019-09-02 NOTE — Op Note (Signed)
[facility] Patient Name: [name] Procedure Date: 09/02/2019 10:00 AM MRN: [id number] Endoscopist: Mauri Pole , MD Age: 68 Referring MD:  Date of Birth: 10-18-1951 Gender: Female Account #: [id number] Procedure:                Colonoscopy Indications:              Screening for colorectal malignant neoplasm Medicines:                Monitored Anesthesia Care Procedure:                Pre-Anesthesia Assessment:                           - Prior to the procedure, a History and Physical                            was performed, and patient medications and                            allergies were reviewed. The patient's tolerance of                            previous anesthesia was also reviewed. The risks                            and benefits of the procedure and the sedation                            options and risks were discussed with the patient.                            All questions were answered, and informed consent                            was obtained. Prior Anticoagulants: The patient has                            taken no previous anticoagulant or antiplatelet                            agents. ASA Grade Assessment: II - A patient with                            mild systemic disease. After reviewing the risks                            and benefits, the patient was deemed in                            satisfactory condition to undergo the procedure.                           After obtaining informed consent, the colonoscope  was passed under direct vision. Throughout the                            procedure, the patient's blood pressure, pulse, and                            oxygen saturations were monitored continuously. The                            Colonoscope was introduced through the anus and                            advanced to the the cecum, identified by                            appendiceal orifice  and ileocecal valve. The                            colonoscopy was performed without difficulty. The                            patient tolerated the procedure well. The quality                            of the bowel preparation was excellent. The                            ileocecal valve, appendiceal orifice, and rectum                            were photographed. Scope In: 10:07:23 AM Scope Out: 10:28:43 AM Scope Withdrawal Time: 0 hours 11 minutes 46 seconds  Total Procedure Duration: 0 hours 21 minutes 20 seconds  Findings:                 The perianal and digital rectal examinations were                            normal.                           A few small-mouthed diverticula were found in the                            sigmoid colon.                           Non-bleeding internal hemorrhoids were found during                            retroflexion. The hemorrhoids were medium-sized.                           The exam was otherwise without abnormality. Complications:            No immediate complications. Estimated Blood Loss:  Estimated blood loss: none. Impression:               - Diverticulosis in the sigmoid colon.                           - Non-bleeding internal hemorrhoids.                           - The examination was otherwise normal.                           - No specimens collected. Recommendation:           - Patient has a contact number available for                            emergencies. The signs and symptoms of potential                            delayed complications were discussed with the                            patient. Return to normal activities tomorrow.                            Written discharge instructions were provided to the                            patient.                           - Resume previous diet.                           - Continue present medications.                           - Repeat colonoscopy in 10 years for  screening                            purposes. Mauri Pole, MD 09/02/2019 10:36:29 AM This report has been signed electronically.

## 2019-09-02 NOTE — Patient Instructions (Signed)
  No specimens taken    start lactose free diet  Return for coloscopy in 10 years   YOU HAD AN ENDOSCOPIC PROCEDURE TODAY AT [facility]:   Refer to the procedure report that was given to you for any specific questions about what was found during the examination.  If the procedure report does not answer your questions, please call your gastroenterologist to clarify.  If you requested that your care partner not be given the details of your procedure findings, then the procedure report has been included in a sealed envelope for you to review at your convenience later.  YOU SHOULD EXPECT: Some feelings of bloating in the abdomen. Passage of more gas than usual.  Walking can help get rid of the air that was put into your GI tract during the procedure and reduce the bloating. If you had a lower endoscopy (such as a colonoscopy or flexible sigmoidoscopy) you may notice spotting of blood in your stool or on the toilet paper. If you underwent a bowel prep for your procedure, you may not have a normal bowel movement for a few days.  Please Note:  You might notice some irritation and congestion in your nose or some drainage.  This is from the oxygen used during your procedure.  There is no need for concern and it should clear up in a day or so.  SYMPTOMS TO REPORT IMMEDIATELY:   Following lower endoscopy (colonoscopy or flexible sigmoidoscopy):  Excessive amounts of blood in the stool  Significant tenderness or worsening of abdominal pains  Swelling of the abdomen that is new, acute  Fever of 100F or higher   For urgent or emergent issues, a gastroenterologist can be reached at any hour by calling [phone].   DIET:  We do recommend a small meal at first, but then you may proceed to your regular diet.  Drink plenty of fluids but you should avoid alcoholic beverages for 24 hours.  ACTIVITY:  You should plan to take it easy for the rest of today and you should NOT DRIVE or use  heavy machinery until tomorrow (because of the sedation medicines used during the test).    FOLLOW UP: Our staff will call the number listed on your records 48-72 hours following your procedure to check on you and address any questions or concerns that you may have regarding the information given to you following your procedure. If we do not reach you, we will leave a message.  We will attempt to reach you two times.  During this call, we will ask if you have developed any symptoms of COVID 19. If you develop any symptoms (ie: fever, flu-like symptoms, shortness of breath, cough etc.) before then, please call [phone].  If you test positive for Covid 19 in the 2 weeks post procedure, please call and report this information to [place].    If any biopsies were taken you will be contacted by phone or by letter within the next 1-3 weeks.  Please call us at [phone] if you have not heard about the biopsies in 3 weeks.    SIGNATURES/CONFIDENTIALITY: You and/or your care partner have signed paperwork which will be entered into your electronic medical record.  These signatures attest to the fact that that the information above on your After Visit Summary has been reviewed and is understood.  Full responsibility of the confidentiality of this discharge information lies with you and/or your care-partner.

## 2019-09-04 ENCOUNTER — Telehealth: Payer: Self-pay | Admitting: *Deleted

## 2019-09-04 NOTE — Telephone Encounter (Signed)
  Follow up Call-  Call back number 09/02/2019  Post procedure Call Back phone  # [phone]  Permission to leave phone message Yes  Some recent data might be hidden     Patient questions:  Do you have a fever, pain , or abdominal swelling? No. Pain Score  0 *  Have you tolerated food without any problems? Yes.    Have you been able to return to your normal activities? Yes.    Do you have any questions about your discharge instructions: Diet   No. Medications  No. Follow up visit  No.  Do you have questions or concerns about your Care? No.  Actions: * If pain score is 4 or above: No action needed, pain <4.  1. Have you developed a fever since your procedure? no  2.   Have you had an respiratory symptoms (SOB or cough) since your procedure? no  3.   Have you tested positive for COVID 19 since your procedure no  4.   Have you had any family members/close contacts diagnosed with the COVID 19 since your procedure?  no   If yes to any of these questions please route to Joylene John, RN and Alphonsa Gin, Therapist, sports.

## 2019-09-13 DIAGNOSIS — L210 Seborrhea capitis: Secondary | ICD-10-CM

## 2019-09-13 HISTORY — DX: Seborrhea capitis: L21.0

## 2019-09-17 ENCOUNTER — Other Ambulatory Visit: Payer: Self-pay

## 2019-10-13 ENCOUNTER — Encounter: Payer: Self-pay | Admitting: Family Medicine

## 2019-10-16 ENCOUNTER — Encounter: Payer: Self-pay | Admitting: Family Medicine

## 2019-10-16 ENCOUNTER — Other Ambulatory Visit: Payer: Self-pay

## 2019-10-16 ENCOUNTER — Ambulatory Visit (INDEPENDENT_AMBULATORY_CARE_PROVIDER_SITE_OTHER): Payer: Medicare Other | Admitting: Family Medicine

## 2019-10-16 VITALS — BP 110/70 | Temp 96.5°F | Wt 150.0 lb

## 2019-10-16 DIAGNOSIS — L42 Pityriasis rosea: Secondary | ICD-10-CM | POA: Diagnosis not present

## 2019-10-16 NOTE — Progress Notes (Signed)
   Subjective:    Patient ID: [name], female    DOB: September 22, 1951, 68 y.o.   MRN: [id number]  HPI Here to check on a rash that started one week ago. She feels fine in general. The rash does not hurt or itch. The first patch appeared in the left armpit, then a larger one appeared on the back. Since then more have appeared.    Review of Systems  Constitutional: Negative.   Respiratory: Negative.   Cardiovascular: Negative.   Skin: Positive for rash.       Objective:   Physical Exam Constitutional:      Appearance: Normal appearance. She is not ill-appearing.  Cardiovascular:     Rate and Rhythm: Normal rate and regular rhythm.     Pulses: Normal pulses.     Heart sounds: Normal heart sounds.  Pulmonary:     Effort: Pulmonary effort is normal.     Breath sounds: Normal breath sounds.  Skin:    Comments: She has numerous macular red slightly scaly patches on the trunk  Neurological:     Mental Status: She is alert.           Assessment & Plan:  Pityriasis rosea. I explained the benign and self-limited nature of this. No treatment is needed.  Alysia Penna, MD

## 2019-10-22 ENCOUNTER — Ambulatory Visit: Payer: Medicare Other | Admitting: Family Medicine

## 2019-10-26 ENCOUNTER — Encounter: Payer: Self-pay | Admitting: Gastroenterology

## 2019-10-26 ENCOUNTER — Ambulatory Visit (INDEPENDENT_AMBULATORY_CARE_PROVIDER_SITE_OTHER): Payer: Medicare Other | Admitting: Gastroenterology

## 2019-10-26 VITALS — BP 128/70 | HR 80 | Temp 98.3°F | Ht 66.0 in | Wt 150.4 lb

## 2019-10-26 DIAGNOSIS — Z1159 Encounter for screening for other viral diseases: Secondary | ICD-10-CM

## 2019-10-26 DIAGNOSIS — R1310 Dysphagia, unspecified: Secondary | ICD-10-CM | POA: Diagnosis not present

## 2019-10-26 NOTE — Patient Instructions (Signed)
You have been scheduled for an endoscopy. Please follow written instructions given to you at your visit today. If you use inhalers (even only as needed), please bring them with you on the day of your procedure.   Chew very well , small bite size pieces   If you are age 67 or older, your body mass index should be between 23-30. Your Body mass index is 24.28 kg/m. If this is out of the aforementioned range listed, please consider follow up with your Primary Care Provider.  If you are age 57 or younger, your body mass index should be between 19-25. Your Body mass index is 24.28 kg/m. If this is out of the aformentioned range listed, please consider follow up with your Primary Care Provider.    I appreciate the  opportunity to care for you  Thank You   Harl Bowie , MD

## 2019-10-26 NOTE — Progress Notes (Signed)
[name]    [id number]    04/08/1951  Primary Care Physician:Banks, Langley Adie, MD  Referring Physician: Billie Ruddy, MD [place],  [place] [address]   Chief complaint: Dysphagia  HPI: 68 year old female is here with complaints of solid dysphagia worse in the past few months.  She has had difficulty swallowing dense bread mostly and meat occasionally for few years now.  She avoids eating bread.  She had an episode of food impaction, it passed after she took sips of carbonated drink [Pepsi], this was about 3 months ago and she has not had any further episodes since then.  Denies any vomiting, odynophagia, abdominal pain, melena or blood per rectum.  She has intermittent diarrhea, she likes to eat ice cream every night, since she started taking fiber supplements her bowel habits have improved and she is also taking Imodium 1 tablet every 2 to 3 days and has 1 formed bowel movement daily.  ERCP June 30, 2011: Multiple stones in CBD s/p sphincterotomy and stone extraction  Colonoscopy September 02, 2019: For colorectal cancer screening showed diverticulosis in sigmoid colon and internal hemorrhoids otherwise normal exam.  Outpatient Encounter Medications as of 10/26/2019  Medication Sig  . escitalopram (LEXAPRO) 20 MG tablet TAKE ONE TABLET (20 MG TOTAL) BY MOUTH EVERY MORNING.  . Multiple Vitamins-Minerals (MULTIVITAMIN GUMMIES ADULT PO) Take by mouth.  Marland Kitchen acyclovir (ZOVIRAX) 200 MG capsule TAKE ONE CAPSULE (200 MG TOTAL) BY MOUTH DAILY. (Patient not taking: Reported on 10/26/2019)  . benzonatate (TESSALON PERLES) 100 MG capsule Take 1 capsule (100 mg total) by mouth 3 (three) times daily as needed. (Patient not taking: Reported on 08/19/2019)  . cyclobenzaprine (FLEXERIL) 10 MG tablet Take 1 tablet (10 mg total) by mouth 3 (three) times daily as needed for muscle spasms. (Patient not taking: Reported on 10/26/2019)  . Naproxen Sodium 220 MG CAPS  Take by mouth.  . triamcinolone cream (KENALOG) 0.1 % Apply 1 application topically 2 (two) times daily. (Patient not taking: Reported on 08/19/2019)  . VENTOLIN HFA 108 (90 Base) MCG/ACT inhaler TAKE 2 PUFFS BY MOUTH EVERY 6 HOURS AS NEEDED FOR WHEEZE OR SHORTNESS OF BREATH (Patient not taking: Reported on 10/26/2019)   No facility-administered encounter medications on file as of 10/26/2019.    Allergies as of 10/26/2019 - Review Complete 10/26/2019  Allergen Reaction Noted  . Sulfamethoxazole Hives 02/07/2007    Past Medical History:  Diagnosis Date  . ABDOMINAL PAIN 05/01/2010  . DEPRESSION 07/21/2007  . DIVERTICULOSIS, COLON 07/26/2008  . Headache(784.0) 07/26/2008    Past Surgical History:  Procedure Laterality Date  . COLONOSCOPY    . ERCP  august 2012  . LAPAROSCOPIC CHOLECYSTECTOMY  08/14/2011  . WISDOM TOOTH EXTRACTION      Family History  Problem Relation Age of Onset  . Diabetes Mother   . Heart disease Mother   . Obesity Mother   . Dementia Mother   . Heart disease Father   . Stroke Father   . Colon cancer Paternal Aunt   . Other Paternal Aunt        esophageal stricture  . Esophageal cancer Neg Hx   . Rectal cancer Neg Hx   . Stomach cancer Neg Hx     Social History   Socioeconomic History  . Marital status: Married    Spouse name: Not on file  . Number of children: Not on file  . Years  of education: Not on file  . Highest education level: Not on file  Occupational History  . Not on file  Tobacco Use  . Smoking status: Never Smoker  . Smokeless tobacco: Never Used  Substance and Sexual Activity  . Alcohol use: No  . Drug use: No  . Sexual activity: Not on file  Other Topics Concern  . Not on file  Social History Narrative  . Not on file   Social Determinants of Health   Financial Resource Strain:   . Difficulty of Paying Living Expenses: Not on file  Food Insecurity:   . Worried About Charity fundraiser in the Last Year: Not on file  .  Ran Out of Food in the Last Year: Not on file  Transportation Needs:   . Lack of Transportation (Medical): Not on file  . Lack of Transportation (Non-Medical): Not on file  Physical Activity:   . Days of Exercise per Week: Not on file  . Minutes of Exercise per Session: Not on file  Stress:   . Feeling of Stress : Not on file  Social Connections:   . Frequency of Communication with Friends and Family: Not on file  . Frequency of Social Gatherings with Friends and Family: Not on file  . Attends Religious Services: Not on file  . Active Member of Clubs or Organizations: Not on file  . Attends Archivist Meetings: Not on file  . Marital Status: Not on file  Intimate Partner Violence:   . Fear of Current or Ex-Partner: Not on file  . Emotionally Abused: Not on file  . Physically Abused: Not on file  . Sexually Abused: Not on file      Review of systems: Review of Systems  Constitutional: Negative for fever and chills.  HENT: Negative.   Eyes: Negative for blurred vision.  Respiratory: Negative for cough, shortness of breath and wheezing.   Cardiovascular: Negative for chest pain and palpitations.  Gastrointestinal: as per HPI Genitourinary: Negative for dysuria, urgency, frequency and hematuria.  Musculoskeletal: Negative for myalgias, back pain and joint pain.  Skin: Negative for itching and rash.  Neurological: Negative for dizziness, tremors, focal weakness, seizures and loss of consciousness.  Endo/Heme/Allergies: Positive for seasonal allergies.  Psychiatric/Behavioral: Negative for depression, suicidal ideas and hallucinations.  All other systems reviewed and are negative.   Physical Exam: Vitals:   10/26/19 1416  BP: 128/70  Pulse: 80  Temp: 98.3 F (36.8 C)   Body mass index is 24.28 kg/m. Gen:      No acute distress HEENT:  EOMI, sclera anicteric Neck:     No masses; no thyromegaly Lungs:    Clear to auscultation bilaterally; normal respiratory  effort CV:         Regular rate and rhythm; no murmurs Abd:      + bowel sounds; soft, non-tender; no palpable masses, no distension Ext:    No edema; adequate peripheral perfusion Skin:      Warm and dry; no rash Neuro: alert and oriented x 3 Psych: normal mood and affect  Data Reviewed:  Reviewed labs, radiology imaging, old records and pertinent past GI work up   Assessment and Plan/Recommendations:  68 year old female with complaints of solid dysphagia.  Differential includes esophageal ring, stricture, uncontrolled GERD, or neoplastic lesion. We will schedule for EGD for further evaluation, esophageal biopsies and dilation if needed Discussed antireflux measures Will plan to start PPI if needed after EGD Advised patient to avoid eating  tough meat or dense bread until after EGD, also advised her to cut food into small bite size pieces and chew well.  The risks and benefits as well as alternatives of endoscopic procedure(s) have been discussed and reviewed. All questions answered. The patient agrees to proceed.    Damaris Hippo , MD    CC: Billie Ruddy, MD

## 2019-10-27 ENCOUNTER — Ambulatory Visit: Payer: Medicare Other

## 2019-10-27 ENCOUNTER — Other Ambulatory Visit: Payer: Medicare Other

## 2019-10-27 ENCOUNTER — Other Ambulatory Visit: Payer: Self-pay | Admitting: Family Medicine

## 2019-10-27 DIAGNOSIS — Z8659 Personal history of other mental and behavioral disorders: Secondary | ICD-10-CM

## 2019-10-27 NOTE — Telephone Encounter (Signed)
Pt LOV was 10/16/2019 and last refill was 06/11/2019, please advise

## 2019-11-17 ENCOUNTER — Other Ambulatory Visit: Payer: Self-pay | Admitting: Gastroenterology

## 2019-11-17 ENCOUNTER — Ambulatory Visit (INDEPENDENT_AMBULATORY_CARE_PROVIDER_SITE_OTHER): Payer: Medicare Other

## 2019-11-17 DIAGNOSIS — Z1159 Encounter for screening for other viral diseases: Secondary | ICD-10-CM

## 2019-11-17 LAB — SARS CORONAVIRUS 2 (TAT 6-24 HRS): SARS Coronavirus 2: NEGATIVE

## 2019-11-19 ENCOUNTER — Other Ambulatory Visit: Payer: Self-pay

## 2019-11-19 ENCOUNTER — Ambulatory Visit (AMBULATORY_SURGERY_CENTER): Payer: Medicare Other | Admitting: Gastroenterology

## 2019-11-19 ENCOUNTER — Encounter: Payer: Self-pay | Admitting: Gastroenterology

## 2019-11-19 VITALS — BP 116/68 | HR 66 | Temp 98.2°F | Resp 20 | Ht 66.0 in | Wt 150.0 lb

## 2019-11-19 DIAGNOSIS — R1310 Dysphagia, unspecified: Secondary | ICD-10-CM

## 2019-11-19 DIAGNOSIS — K2970 Gastritis, unspecified, without bleeding: Secondary | ICD-10-CM

## 2019-11-19 DIAGNOSIS — K222 Esophageal obstruction: Secondary | ICD-10-CM

## 2019-11-19 DIAGNOSIS — K3189 Other diseases of stomach and duodenum: Secondary | ICD-10-CM | POA: Diagnosis not present

## 2019-11-19 MED ORDER — OMEPRAZOLE 40 MG PO CPDR: 40.0000 mg | DELAYED_RELEASE_CAPSULE | Freq: Every day | ORAL | 0 refills | Status: DC

## 2019-11-19 MED ORDER — SODIUM CHLORIDE 0.9 % IV SOLN: 500.0000 mL | Freq: Once | INTRAVENOUS | Status: DC

## 2019-11-19 NOTE — Op Note (Signed)
[facility] Patient Name: [name] Procedure Date: 11/19/2019 10:32 AM MRN: [id number] Endoscopist: Mauri Pole , MD Age: 69 Referring MD:  Date of Birth: 1951/06/13 Gender: Female Account #: [id number] Procedure:                Upper GI endoscopy Indications:              Dysphagia Medicines:                Monitored Anesthesia Care Procedure:                Pre-Anesthesia Assessment:                           - Prior to the procedure, a History and Physical                            was performed, and patient medications and                            allergies were reviewed. The patient's tolerance of                            previous anesthesia was also reviewed. The risks                            and benefits of the procedure and the sedation                            options and risks were discussed with the patient.                            All questions were answered, and informed consent                            was obtained. Prior Anticoagulants: The patient has                            taken no previous anticoagulant or antiplatelet                            agents. ASA Grade Assessment: II - A patient with                            mild systemic disease. After reviewing the risks                            and benefits, the patient was deemed in                            satisfactory condition to undergo the procedure.                           After obtaining informed consent, the endoscope was  passed under direct vision. Throughout the                            procedure, the patient's blood pressure, pulse, and                            oxygen saturations were monitored continuously. The                            Endoscope was introduced through the mouth, and                            advanced to the second part of duodenum. The upper                            GI endoscopy was accomplished without  difficulty.                            The patient tolerated the procedure well. Scope In: Scope Out: Findings:                 One benign-appearing, intrinsic moderate                            (circumferential scarring or stenosis; an endoscope                            may pass) stenosis was found 34 to 35 cm from the                            incisors. This stenosis measured 1.6 cm (inner                            diameter) x less than one cm (in length). The                            stenosis was traversed. A TTS dilator was passed                            through the scope. Dilation with an 18-19-20 mm                            balloon dilator was performed to 18 mm. The                            dilation site was examined following endoscope                            reinsertion and showed moderate mucosal disruption.                           A 5 cm hiatal hernia was present.  Patchy moderate inflammation characterized by                            congestion (edema), erosions, erythema and mucus                            was found in the gastric antrum, in the prepyloric                            region of the stomach and at the pylorus. Biopsies                            were taken with a cold forceps for Helicobacter                            pylori cultures.                           The examined duodenum was normal. Complications:            No immediate complications. Estimated Blood Loss:     Estimated blood loss was minimal. Impression:               - Benign-appearing esophageal stenosis. Dilated.                           - 5 cm hiatal hernia.                           - Gastritis. Biopsied.                           - Normal examined duodenum. Recommendation:           - Patient has a contact number available for                            emergencies. The signs and symptoms of potential                            delayed  complications were discussed with the                            patient. Return to normal activities tomorrow.                            Written discharge instructions were provided to the                            patient.                           - Resume liquids today and starting tomorrow                            morning previous diet.                           -  Continue present medications.                           - Await pathology results.                           - Use Prilosec (omeprazole) 40 mg PO daily for 3                            months.                           - Return to my office at the next available                            appointment in 4-6 weeks. Mauri Pole, MD 11/19/2019 11:23:14 AM This report has been signed electronically.

## 2019-11-19 NOTE — Progress Notes (Signed)
PT taken to PACU. Monitors in place. VSS. Report given to RN. 

## 2019-11-19 NOTE — Progress Notes (Signed)
K.A. vital signs. J.B. temps.

## 2019-11-19 NOTE — Progress Notes (Signed)
Called to room to assist during endoscopic procedure.  Patient ID and intended procedure confirmed with present staff. Received instructions for my participation in the procedure from the performing physician.  

## 2019-11-19 NOTE — Patient Instructions (Signed)
Please read handouts provided. Follow Dilation Diet instructions. Await pathology results. Begin Prilosec ( omeprazole ) 40 mg daily for 3 months. Return to GI office in 4-6 weeks.      YOU HAD AN ENDOSCOPIC PROCEDURE TODAY AT [facility]:   Refer to the procedure report that was given to you for any specific questions about what was found during the examination.  If the procedure report does not answer your questions, please call your gastroenterologist to clarify.  If you requested that your care partner not be given the details of your procedure findings, then the procedure report has been included in a sealed envelope for you to review at your convenience later.  YOU SHOULD EXPECT: Some feelings of bloating in the abdomen. Passage of more gas than usual.  Walking can help get rid of the air that was put into your GI tract during the procedure and reduce the bloating. If you had a lower endoscopy (such as a colonoscopy or flexible sigmoidoscopy) you may notice spotting of blood in your stool or on the toilet paper. If you underwent a bowel prep for your procedure, you may not have a normal bowel movement for a few days.  Please Note:  You might notice some irritation and congestion in your nose or some drainage.  This is from the oxygen used during your procedure.  There is no need for concern and it should clear up in a day or so.  SYMPTOMS TO REPORT IMMEDIATELY:    Following upper endoscopy (EGD)  Vomiting of blood or coffee ground material  New chest pain or pain under the shoulder blades  Painful or persistently difficult swallowing  New shortness of breath  Fever of 100F or higher  Black, tarry-looking stools  For urgent or emergent issues, a gastroenterologist can be reached at any hour by calling [phone].   DIET:  Drink plenty of fluids but you should avoid alcoholic beverages for 24 hours.  ACTIVITY:  You should plan to take it easy for the rest of  today and you should NOT DRIVE or use heavy machinery until tomorrow (because of the sedation medicines used during the test).    FOLLOW UP: Our staff will call the number listed on your records 48-72 hours following your procedure to check on you and address any questions or concerns that you may have regarding the information given to you following your procedure. If we do not reach you, we will leave a message.  We will attempt to reach you two times.  During this call, we will ask if you have developed any symptoms of COVID 19. If you develop any symptoms (ie: fever, flu-like symptoms, shortness of breath, cough etc.) before then, please call [phone].  If you test positive for Covid 19 in the 2 weeks post procedure, please call and report this information to [place].    If any biopsies were taken you will be contacted by phone or by letter within the next 1-3 weeks.  Please call us at [phone] if you have not heard about the biopsies in 3 weeks.    SIGNATURES/CONFIDENTIALITY: You and/or your care partner have signed paperwork which will be entered into your electronic medical record.  These signatures attest to the fact that that the information above on your After Visit Summary has been reviewed and is understood.  Full responsibility of the confidentiality of this discharge information lies with you and/or your care-partner.

## 2019-11-23 ENCOUNTER — Encounter: Payer: Self-pay | Admitting: Gastroenterology

## 2019-11-23 ENCOUNTER — Telehealth: Payer: Self-pay

## 2019-11-23 NOTE — Telephone Encounter (Signed)
Left message on follow up call. 

## 2019-12-22 ENCOUNTER — Ambulatory Visit: Payer: Medicare Other | Admitting: Gastroenterology

## 2019-12-27 ENCOUNTER — Ambulatory Visit: Payer: Medicare Other | Attending: Internal Medicine

## 2019-12-27 DIAGNOSIS — Z23 Encounter for immunization: Secondary | ICD-10-CM | POA: Insufficient documentation

## 2019-12-27 NOTE — Progress Notes (Signed)
   Covid-19 Vaccination Clinic  Name:  [name]    MRN: [id number] DOB: 1951/10/05  12/27/2019  [name] was observed post Covid-19 immunization for 15 minutes without incidence. She was provided with Vaccine Information Sheet and instruction to access the V-Safe system.   [name] was instructed to call 911 with any severe reactions post vaccine: Marland Kitchen Difficulty breathing  . Swelling of your face and throat  . A fast heartbeat  . A bad rash all over your body  . Dizziness and weakness    Immunizations Administered    Name Date Dose VIS Date Route   Pfizer COVID-19 Vaccine 12/27/2019 10:03 AM 0.3 mL 10/23/2019 Intramuscular   Manufacturer: [place]   Lot: [id number]   [place]: [id number]

## 2020-01-08 ENCOUNTER — Other Ambulatory Visit: Payer: Self-pay | Admitting: Family Medicine

## 2020-01-08 DIAGNOSIS — Z13820 Encounter for screening for osteoporosis: Secondary | ICD-10-CM

## 2020-01-12 ENCOUNTER — Other Ambulatory Visit: Payer: Self-pay

## 2020-01-12 ENCOUNTER — Ambulatory Visit
Admission: RE | Admit: 2020-01-12 | Discharge: 2020-01-12 | Disposition: A | Discharge status: Home / Self Care | Payer: Medicare Other | Source: Ambulatory Visit | Attending: Family Medicine | Admitting: Family Medicine

## 2020-01-12 DIAGNOSIS — Z1231 Encounter for screening mammogram for malignant neoplasm of breast: Secondary | ICD-10-CM

## 2020-01-12 DIAGNOSIS — M85852 Other specified disorders of bone density and structure, left thigh: Secondary | ICD-10-CM | POA: Diagnosis not present

## 2020-01-12 DIAGNOSIS — Z13820 Encounter for screening for osteoporosis: Secondary | ICD-10-CM

## 2020-01-12 DIAGNOSIS — Z780 Asymptomatic menopausal state: Secondary | ICD-10-CM | POA: Diagnosis not present

## 2020-01-13 ENCOUNTER — Other Ambulatory Visit: Payer: Self-pay | Admitting: Family Medicine

## 2020-01-19 ENCOUNTER — Ambulatory Visit: Payer: Medicare Other | Attending: Internal Medicine

## 2020-01-19 DIAGNOSIS — Z23 Encounter for immunization: Secondary | ICD-10-CM | POA: Insufficient documentation

## 2020-01-19 NOTE — Progress Notes (Signed)
   Covid-19 Vaccination Clinic  Name:  [name]    MRN: [id number] DOB: 04/02/1951  01/19/2020  [name] was observed post Covid-19 immunization for 15 minutes without incident. She was provided with Vaccine Information Sheet and instruction to access the V-Safe system.   [name] was instructed to call 911 with any severe reactions post vaccine: Marland Kitchen Difficulty breathing  . Swelling of face and throat  . A fast heartbeat  . A bad rash all over body  . Dizziness and weakness   Immunizations Administered    Name Date Dose VIS Date Route   Pfizer COVID-19 Vaccine 01/19/2020 10:43 AM 0.3 mL 10/23/2019 Intramuscular   Manufacturer: [place]   Lot: [id number]   [place]: [id number]

## 2020-01-20 ENCOUNTER — Other Ambulatory Visit: Payer: Self-pay | Admitting: Family Medicine

## 2020-01-20 DIAGNOSIS — Z87828 Personal history of other (healed) physical injury and trauma: Secondary | ICD-10-CM

## 2020-01-20 DIAGNOSIS — Z8739 Personal history of other diseases of the musculoskeletal system and connective tissue: Secondary | ICD-10-CM

## 2020-01-20 NOTE — Telephone Encounter (Signed)
Pt LOV was on 10/16/2019 and last refill was on 08/07/2018 for 60 tablets with 5 refills, ok to send refill

## 2020-01-20 NOTE — Telephone Encounter (Signed)
Please advise 

## 2020-02-04 ENCOUNTER — Telehealth: Payer: Medicare Other | Admitting: Emergency Medicine

## 2020-02-04 DIAGNOSIS — M5442 Lumbago with sciatica, left side: Secondary | ICD-10-CM | POA: Diagnosis not present

## 2020-02-04 MED ORDER — PREDNISONE 20 MG PO TABS: 40.0000 mg | ORAL_TABLET | Freq: Every day | ORAL | 0 refills | Status: DC

## 2020-02-04 NOTE — Progress Notes (Signed)
We are sorry that you are not feeling well.  Here is how we plan to help!  Let's try some prednisone.  Your symptoms seem consistent with lumbar radiculopathy, which means that you have something in your low back causing pain to radiate away from the back and in your case, into your leg.  This is usually caused by an intervertebral disc putting pressure on a nerve.  The exercises you are doing should help with this.  The next step would be to try a steroid, like Prednisone, which I have prescribed.  I don't see any history of diabetes, but if you have diabetes, use caution with the prednisone as it can elevate your blood sugar.   If this doesn't help, you will need to see your doctor and may need to have an MRI of your low back.  Please see below for some additional tips.   Acute back pain is defined as musculoskeletal pain that can resolve in 1-3 weeks with conservative treatment.   Some patients experience stomach irritation or in increased heartburn with anti-inflammatory drugs.  Please keep in mind that muscle relaxer's can cause fatigue and should not be taken while at work or driving.  Back pain is very common.  The pain often gets better over time.  The cause of back pain is usually not dangerous.  Most people can learn to manage their back pain on their own.  Home Care  Stay active.  Start with short walks on flat ground if you can.  Try to walk farther each day.  Do not sit, drive or stand in one place for more than 30 minutes.  Do not stay in bed.  Do not avoid exercise or work.  Activity can help your back heal faster.  Be careful when you bend or lift an object.  Bend at your knees, keep the object close to you, and do not twist.  Sleep on a firm mattress.  Lie on your side, and bend your knees.  If you lie on your back, put a pillow under your knees.  Only take medicines as told by your doctor.  Put ice on the injured area.  Put ice in a plastic bag  Place a towel  between your skin and the bag  Leave the ice on for 15-20 minutes, 3-4 times a day for the first 2-3 days. 210 After that, you can switch between ice and heat packs.  Ask your doctor about back exercises or massage.  Avoid feeling anxious or stressed.  Find good ways to deal with stress, such as exercise.  Get Help Right Way If:  Your pain does not go away with rest or medicine.  Your pain does not go away in 1 week.  You have new problems.  You do not feel well.  The pain spreads into your legs.  You cannot control when you poop (bowel movement) or pee (urinate)  You feel sick to your stomach (nauseous) or throw up (vomit)  You have belly (abdominal) pain.  You feel like you may pass out (faint).  If you develop a fever.  Make Sure you:  Understand these instructions.  Will watch your condition  Will get help right away if you are not doing well or get worse.  Your e-visit answers were reviewed by a board certified advanced clinical practitioner to complete your personal care plan.  Depending on the condition, your plan could have included both over the counter or prescription medications.  If  there is a problem please reply  once you have received a response from your provider.  Your safety is important to [place].  If you have drug allergies check your prescription carefully.    You can use MyChart to ask questions about today's visit, request a non-urgent call back, or ask for a work or school excuse for 24 hours related to this e-Visit. If it has been greater than 24 hours you will need to follow up with your provider, or enter a new e-Visit to address those concerns.  You will get an e-mail in the next two days asking about your experience.  I hope that your e-visit has been valuable and will speed your recovery. Thank you for using e-visits.  Approximately 5 minutes was used in reviewing the patient's chart, questionnaire, prescribing medications, and  documentation.

## 2020-02-14 ENCOUNTER — Other Ambulatory Visit: Payer: Self-pay | Admitting: Gastroenterology

## 2020-03-06 ENCOUNTER — Encounter: Payer: Self-pay | Admitting: Family Medicine

## 2020-03-11 ENCOUNTER — Other Ambulatory Visit: Payer: Self-pay

## 2020-03-14 ENCOUNTER — Ambulatory Visit (INDEPENDENT_AMBULATORY_CARE_PROVIDER_SITE_OTHER): Payer: Medicare Other | Admitting: Family Medicine

## 2020-03-14 ENCOUNTER — Other Ambulatory Visit: Payer: Self-pay

## 2020-03-14 ENCOUNTER — Encounter: Payer: Self-pay | Admitting: Family Medicine

## 2020-03-14 VITALS — BP 110/76 | HR 100 | Temp 98.0°F | Wt 159.0 lb

## 2020-03-14 DIAGNOSIS — M5442 Lumbago with sciatica, left side: Secondary | ICD-10-CM | POA: Diagnosis not present

## 2020-03-14 DIAGNOSIS — M5441 Lumbago with sciatica, right side: Secondary | ICD-10-CM | POA: Diagnosis not present

## 2020-03-14 DIAGNOSIS — G8929 Other chronic pain: Secondary | ICD-10-CM

## 2020-03-14 MED ORDER — METHYLPREDNISOLONE 4 MG PO TBPK: ORAL_TABLET | ORAL | 0 refills | Status: DC

## 2020-03-14 NOTE — Progress Notes (Signed)
Subjective:    Patient ID: [name], female    DOB: 1951/08/15, 69 y.o.   MRN: [id number]  No chief complaint on file.   HPI Patient was seen today for f/u.  Pt endorses low back pain, muscle spasms, and b/l sciatica x 12 wks. symptoms started after trimming hedges all day.  Pt initially seen via [facility] ev[facility].  Given prednisone.  Pt states it helped for the first 2 days at the higher dose.  Pt notes pulling weeds at church recently which caused spasms in R side of back and across lower L back.  Pt endorses pain down posterior legs.  Pt taking flexeril at night, but notes difficulty going up stairs in the morning.  Pt has to stretch before she can start her day.  L sided sciatica worse than R. Pt tried, Advil, Tylenol.  Past Medical History:  Diagnosis Date  . ABDOMINAL PAIN 05/01/2010  . DEPRESSION 07/21/2007  . DIVERTICULOSIS, COLON 07/26/2008  . Headache(784.0) 07/26/2008  . Pityriasis 09/13/2019    Allergies  Allergen Reactions  . Sulfamethoxazole Hives    Happened in childhood, does not remember reaction.    ROS General: Denies fever, chills, night sweats, changes in weight, changes in appetite HEENT: Denies headaches, ear pain, changes in vision, rhinorrhea, sore throat CV: Denies CP, palpitations, SOB, orthopnea Pulm: Denies SOB, cough, wheezing GI: Denies abdominal pain, nausea, vomiting, diarrhea, constipation GU: Denies dysuria, hematuria, frequency, vaginal discharge Msk: Denies muscle cramps, joint pains  +low back pain and b/l sciatica Neuro: Denies weakness, numbness, tingling Skin: Denies rashes, bruising Psych: Denies depression, anxiety, hallucinations      Objective:    Blood pressure 110/76, pulse 100, temperature 98 F (36.7 C), temperature source Temporal, weight 159 lb (72.1 kg), SpO2 98 %.   Gen. Pleasant, well-nourished, in no distress, normal affect   HEENT: [place]/AT, face symmetric, no scleral icterus, PERRLA, EOMI, nares patent without  drainage Lungs: no accessory muscle use Cardiovascular: RRR, no peripheral edema Musculoskeletal: No TTP of cervical, thoracic, lumbar spine, or paraspinal muscles.  TTP of posterior left hip.  no TTP of bilatera hips.  Negative straight leg raise, logroll, FADIR, FABER.   no deformities, no cyanosis or clubbing, normal tone Neuro:  A&Ox3, CN II-XII intact, normal gait Skin:  Warm, no lesions/ rash   Wt Readings from Last 3 Encounters:  11/19/19 150 lb (68 kg)  10/26/19 150 lb 6.4 oz (68.2 kg)  10/16/19 150 lb (68 kg)    Lab Results  Component Value Date   WBC 4.8 08/08/2011   HGB 13.3 08/08/2011   HCT 39.6 08/08/2011   PLT 230 08/08/2011   GLUCOSE 98 07/23/2019   CHOL 151 07/23/2019   TRIG 92.0 07/23/2019   HDL 61.30 07/23/2019   LDLCALC 71 07/23/2019   ALT 10 08/08/2011   AST 19 08/08/2011   NA 142 07/23/2019   K 4.3 07/23/2019   CL 105 07/23/2019   CREATININE 0.86 07/23/2019   BUN 15 07/23/2019   CO2 29 07/23/2019   TSH 1.58 07/21/2008   INR 0.97 06/29/2011    Assessment/Plan:  Chronic bilateral low back pain with bilateral sciatica  -Continue supportive care -Okay to continue Flexeril as needed -Discussed PT and imaging.  Pt wishes to wait -Given handout and stretching exercises - Plan: methylPREDNISolone (MEDROL DOSEPAK) 4 MG TBPK tablet  F/u prn  Grier Mitts, MD

## 2020-03-14 NOTE — Patient Instructions (Signed)
Sciatica  Sciatica is pain, numbness, weakness, or tingling along the path of the sciatic nerve. The sciatic nerve starts in the lower back and runs down the back of each leg. The nerve controls the muscles in the lower leg and in the back of the knee. It also provides feeling (sensation) to the back of the thigh, the lower leg, and the sole of the foot. Sciatica is a symptom of another medical condition that pinches or puts pressure on the sciatic nerve. Sciatica most often only affects one side of the body. Sciatica usually goes away on its own or with treatment. In some cases, sciatica may come back (recur). What are the causes? This condition is caused by pressure on the sciatic nerve or pinching of the nerve. This may be the result of:  A disk in between the bones of the spine bulging out too far (herniated disk).  Age-related changes in the spinal disks.  A pain disorder that affects a muscle in the buttock.  Extra bone growth near the sciatic nerve.  A break (fracture) of the pelvis.  Pregnancy.  Tumor. This is rare. What increases the risk? The following factors may make you more likely to develop this condition:  Playing sports that place pressure or stress on the spine.  Having poor strength and flexibility.  A history of back injury or surgery.  Sitting for long periods of time.  Doing activities that involve repetitive bending or lifting.  Obesity. What are the signs or symptoms? Symptoms can vary from mild to very severe, and they may include:  Any of these problems in the lower back, leg, hip, or buttock: ? Mild tingling, numbness, or dull aches. ? Burning sensations. ? Sharp pains.  Numbness in the back of the calf or the sole of the foot.  Leg weakness.  Severe back pain that makes movement difficult. Symptoms may get worse when you cough, sneeze, or laugh, or when you sit or stand for long periods of time. How is this diagnosed? This condition may be  diagnosed based on:  Your symptoms and medical history.  A physical exam.  Blood tests.  Imaging tests, such as: ? X-rays. ? MRI. ? CT scan. How is this treated? In many cases, this condition improves on its own without treatment. However, treatment may include:  Reducing or modifying physical activity.  Exercising and stretching.  Icing and applying heat to the affected area.  Medicines that help to: ? Relieve pain and swelling. ? Relax your muscles.  Injections of medicines that help to relieve pain, irritation, and inflammation around the sciatic nerve (steroids).  Surgery. Follow these instructions at home: Medicines  Take over-the-counter and prescription medicines only as told by your health care provider.  Ask your health care provider if the medicine prescribed to you: ? Requires you to avoid driving or using heavy machinery. ? Can cause constipation. You may need to take these actions to prevent or treat constipation:  Drink enough fluid to keep your urine pale yellow.  Take over-the-counter or prescription medicines.  Eat foods that are high in fiber, such as beans, whole grains, and fresh fruits and vegetables.  Limit foods that are high in fat and processed sugars, such as fried or sweet foods. Managing pain      If directed, put ice on the affected area. ? Put ice in a plastic bag. ? Place a towel between your skin and the bag. ? Leave the ice on for 20 minutes,   2-3 times a day.  If directed, apply heat to the affected area. Use the heat source that your health care provider recommends, such as a moist heat pack or a heating pad. ? Place a towel between your skin and the heat source. ? Leave the heat on for 20-30 minutes. ? Remove the heat if your skin turns bright red. This is especially important if you are unable to feel pain, heat, or cold. You may have a greater risk of getting burned. Activity   Return to your normal activities as told  by your health care provider. Ask your health care provider what activities are safe for you.  Avoid activities that make your symptoms worse.  Take brief periods of rest throughout the day. ? When you rest for longer periods, mix in some mild activity or stretching between periods of rest. This will help to prevent stiffness and pain. ? Avoid sitting for long periods of time without moving. Get up and move around at least one time each hour.  Exercise and stretch regularly, as told by your health care provider.  Do not lift anything that is heavier than 10 lb (4.5 kg) while you have symptoms of sciatica. When you do not have symptoms, you should still avoid heavy lifting, especially repetitive heavy lifting.  When you lift objects, always use proper lifting technique, which includes: ? Bending your knees. ? Keeping the load close to your body. ? Avoiding twisting. General instructions  Maintain a healthy weight. Excess weight puts extra stress on your back.  Wear supportive, comfortable shoes. Avoid wearing high heels.  Avoid sleeping on a mattress that is too soft or too hard. A mattress that is firm enough to support your back when you sleep may help to reduce your pain.  Keep all follow-up visits as told by your health care provider. This is important. Contact a health care provider if:  You have pain that: ? Wakes you up when you are sleeping. ? Gets worse when you lie down. ? Is worse than you have experienced in the past. ? Lasts longer than 4 weeks.  You have an unexplained weight loss. Get help right away if:  You are not able to control when you urinate or have bowel movements (incontinence).  You have: ? Weakness in your lower back, pelvis, buttocks, or legs that gets worse. ? Redness or swelling of your back. ? A burning sensation when you urinate. Summary  Sciatica is pain, numbness, weakness, or tingling along the path of the sciatic nerve.  This condition  is caused by pressure on the sciatic nerve or pinching of the nerve.  Sciatica can cause pain, numbness, or tingling in the lower back, legs, hips, and buttocks.  Treatment often includes rest, exercise, medicines, and applying ice or heat. This information is not intended to replace advice given to you by your health care provider. Make sure you discuss any questions you have with your health care provider. Document Revised: 11/17/2018 Document Reviewed: 11/17/2018 Elsevier Patient Education  2020 [employer] Sciatica Rehab Ask your health care provider which exercises are safe for you. Do exercises exactly as told by your health care provider and adjust them as directed. It is normal to feel mild stretching, pulling, tightness, or discomfort as you do these exercises. Stop right away if you feel sudden pain or your pain gets worse. Do not begin these exercises until told by your health care provider. Stretching and range-of-motion exercises These exercises warm up   your muscles and joints and improve the movement and flexibility of your hips and back. These exercises also help to relieve pain, numbness, and tingling. Sciatic nerve glide 1. Sit in a chair with your head facing down toward your chest. Place your hands behind your back. Let your shoulders slump forward. 2. Slowly straighten one of your legs while you tilt your head back as if you are looking toward the ceiling. Only straighten your leg as far as you can without making your symptoms worse. 3. Hold this position for __________ seconds. 4. Slowly return to the starting position. 5. Repeat with your other leg. Repeat __________ times. Complete this exercise __________ times a day. Knee to chest with hip adduction and internal rotation  1. Lie on your back on a firm surface with both legs straight. 2. Bend one of your knees and move it up toward your chest until you feel a gentle stretch in your lower back and buttock. Then, move  your knee toward the shoulder that is on the opposite side from your leg. This is hip adduction and internal rotation. ? Hold your leg in this position by holding on to the front of your knee. 3. Hold this position for __________ seconds. 4. Slowly return to the starting position. 5. Repeat with your other leg. Repeat __________ times. Complete this exercise __________ times a day. Prone extension on elbows  1. Lie on your abdomen on a firm surface. A bed may be too soft for this exercise. 2. Prop yourself up on your elbows. 3. Use your arms to help lift your chest up until you feel a gentle stretch in your abdomen and your lower back. ? This will place some of your body weight on your elbows. If this is uncomfortable, try stacking pillows under your chest. ? Your hips should stay down, against the surface that you are lying on. Keep your hip and back muscles relaxed. 4. Hold this position for __________ seconds. 5. Slowly relax your upper body and return to the starting position. Repeat __________ times. Complete this exercise __________ times a day. Strengthening exercises These exercises build strength and endurance in your back. Endurance is the ability to use your muscles for a long time, even after they get tired. Pelvic tilt This exercise strengthens the muscles that lie deep in the abdomen. 1. Lie on your back on a firm surface. Bend your knees and keep your feet flat on the floor. 2. Tense your abdominal muscles. Tip your pelvis up toward the ceiling and flatten your lower back into the floor. ? To help with this exercise, you may place a small towel under your lower back and try to push your back into the towel. 3. Hold this position for __________ seconds. 4. Let your muscles relax completely before you repeat this exercise. Repeat __________ times. Complete this exercise __________ times a day. Alternating arm and leg raises  1. Get on your hands and knees on a firm surface.  If you are on a hard floor, you may want to use padding, such as an exercise mat, to cushion your knees. 2. Line up your arms and legs. Your hands should be directly below your shoulders, and your knees should be directly below your hips. 3. Lift your left leg behind you. At the same time, raise your right arm and straighten it in front of you. ? Do not lift your leg higher than your hip. ? Do not lift your arm higher than your shoulder. ?   Keep your abdominal and back muscles tight. ? Keep your hips facing the ground. ? Do not arch your back. ? Keep your balance carefully, and do not hold your breath. 4. Hold this position for __________ seconds. 5. Slowly return to the starting position. 6. Repeat with your right leg and your left arm. Repeat __________ times. Complete this exercise __________ times a day. Posture and body mechanics Good posture and healthy body mechanics can help to relieve stress in your body's tissues and joints. Body mechanics refers to the movements and positions of your body while you do your daily activities. Posture is part of body mechanics. Good posture means:  Your spine is in its natural S-curve position (neutral).  Your shoulders are pulled back slightly.  Your head is not tipped forward. Follow these guidelines to improve your posture and body mechanics in your everyday activities. Standing   When standing, keep your spine neutral and your feet about hip width apart. Keep a slight bend in your knees. Your ears, shoulders, and hips should line up.  When you do a task in which you stand in one place for a long time, place one foot up on a stable object that is 2-4 inches (5-10 cm) high, such as a footstool. This helps keep your spine neutral. Sitting   When sitting, keep your spine neutral and keep your feet flat on the floor. Use a footrest, if necessary, and keep your thighs parallel to the floor. Avoid rounding your shoulders, and avoid tilting your  head forward.  When working at a desk or a computer, keep your desk at a height where your hands are slightly lower than your elbows. Slide your chair under your desk so you are close enough to maintain good posture.  When working at a computer, place your monitor at a height where you are looking straight ahead and you do not have to tilt your head forward or downward to look at the screen. Resting  When lying down and resting, avoid positions that are most painful for you.  If you have pain with activities such as sitting, bending, stooping, or squatting, lie in a position in which your body does not bend very much. For example, avoid curling up on your side with your arms and knees near your chest (fetal position).  If you have pain with activities such as standing for a long time or reaching with your arms, lie with your spine in a neutral position and bend your knees slightly. Try the following positions: ? Lying on your side with a pillow between your knees. ? Lying on your back with a pillow under your knees. Lifting   When lifting objects, keep your feet at least shoulder width apart and tighten your abdominal muscles.  Bend your knees and hips and keep your spine neutral. It is important to lift using the strength of your legs, not your back. Do not lock your knees straight out.  Always ask for help to lift heavy or awkward objects. This information is not intended to replace advice given to you by your health care provider. Make sure you discuss any questions you have with your health care provider. Document Revised: 02/20/2019 Document Reviewed: 11/20/2018 [employer] Patient Education  2020 [employer]  

## 2020-04-08 ENCOUNTER — Other Ambulatory Visit: Payer: Self-pay | Admitting: Family Medicine

## 2020-04-19 ENCOUNTER — Encounter: Payer: Self-pay | Admitting: Family Medicine

## 2020-04-25 ENCOUNTER — Other Ambulatory Visit: Payer: Self-pay | Admitting: Family Medicine

## 2020-04-25 ENCOUNTER — Other Ambulatory Visit: Payer: Self-pay

## 2020-04-25 ENCOUNTER — Ambulatory Visit (INDEPENDENT_AMBULATORY_CARE_PROVIDER_SITE_OTHER)
Admission: RE | Admit: 2020-04-25 | Discharge: 2020-04-25 | Disposition: A | Discharge status: Home / Self Care | Payer: Medicare Other | Source: Ambulatory Visit | Attending: Family Medicine | Admitting: Family Medicine

## 2020-04-25 DIAGNOSIS — G8929 Other chronic pain: Secondary | ICD-10-CM | POA: Diagnosis not present

## 2020-04-25 DIAGNOSIS — M5442 Lumbago with sciatica, left side: Secondary | ICD-10-CM

## 2020-04-25 DIAGNOSIS — M5441 Lumbago with sciatica, right side: Secondary | ICD-10-CM

## 2020-04-25 DIAGNOSIS — M545 Low back pain: Secondary | ICD-10-CM | POA: Diagnosis not present

## 2020-05-10 ENCOUNTER — Other Ambulatory Visit: Payer: Self-pay | Admitting: Family Medicine

## 2020-05-10 DIAGNOSIS — Z8739 Personal history of other diseases of the musculoskeletal system and connective tissue: Secondary | ICD-10-CM

## 2020-05-10 DIAGNOSIS — Z87828 Personal history of other (healed) physical injury and trauma: Secondary | ICD-10-CM

## 2020-05-18 ENCOUNTER — Ambulatory Visit: Payer: Medicare Other | Attending: Family Medicine | Admitting: Physical Therapy

## 2020-05-18 ENCOUNTER — Encounter: Payer: Self-pay | Admitting: Physical Therapy

## 2020-05-18 ENCOUNTER — Other Ambulatory Visit: Payer: Self-pay

## 2020-05-18 DIAGNOSIS — M79605 Pain in left leg: Secondary | ICD-10-CM | POA: Diagnosis not present

## 2020-05-18 DIAGNOSIS — M79604 Pain in right leg: Secondary | ICD-10-CM | POA: Diagnosis not present

## 2020-05-18 DIAGNOSIS — M545 Low back pain, unspecified: Secondary | ICD-10-CM

## 2020-05-18 DIAGNOSIS — R293 Abnormal posture: Secondary | ICD-10-CM | POA: Insufficient documentation

## 2020-05-18 DIAGNOSIS — G8929 Other chronic pain: Secondary | ICD-10-CM | POA: Diagnosis not present

## 2020-05-18 NOTE — Therapy (Signed)
[place], [place], [address] Phone: [phone]   Fax:  [phone]  Physical Therapy Evaluation  Patient Details  Name: [name] MRN: [id number] Date of Birth: 01/11/1951 Referring Provider (PT): Grier Mitts, MD   Encounter Date: 05/18/2020   PT End of Session - 05/18/20 1142    Visit Number 1    Number of Visits 5    Date for PT Re-Evaluation 06/18/20    Authorization Type MCR- KX at visit 15    Progress Note Due on Visit 10    PT Start Time 1135    PT Stop Time 1218    PT Time Calculation (min) 43 min    Activity Tolerance Patient tolerated treatment well    Behavior During Therapy [facility] for tasks assessed/performed           Past Medical History:  Diagnosis Date  . ABDOMINAL PAIN 05/01/2010  . DEPRESSION 07/21/2007  . DIVERTICULOSIS, COLON 07/26/2008  . Headache(784.0) 07/26/2008  . Pityriasis 09/13/2019    Past Surgical History:  Procedure Laterality Date  . COLONOSCOPY    . ERCP  august 2012  . LAPAROSCOPIC CHOLECYSTECTOMY  08/14/2011  . WISDOM TOOTH EXTRACTION      There were no vitals filed for this visit.    Subjective Assessment - 05/18/20 1136    Subjective Diagnosed with sciatica about 6 years ago. usually lasts only 2-3 weeks but now it's going on 5 months. gets better as the day goes on. LBP on rt side, radiating down both legs- worse on Lt.    How long can you sit comfortably? no pain in sitting or in bed at night    How long can you walk comfortably? have spasms on Rt low back after being on feet 2-3 hrs    Patient Stated Goals walking, decrease pain & get back to exercising & getting on with life    Currently in Pain? Yes    Pain Location Back    Pain Orientation Right;Lower    Pain Descriptors / Indicators Spasm    Pain Radiating Towards bil LE- pointing along IT band    Aggravating Factors  up on feet 2-3 hrs    Pain Relieving Factors flexeril               [facility] PT Assessment - 05/18/20 0001      Assessment   Medical Diagnosis LBP with bil sciatica    Referring Provider (PT) Grier Mitts, MD    Onset Date/Surgical Date 12/15/19      Precautions   Precautions None      Restrictions   Weight Bearing Restrictions No      Balance Screen   Has the patient fallen in the past 6 months Yes    How many times? 1    Has the patient had a decrease in activity level because of a fear of falling?  No    Is the patient reluctant to leave their home because of a fear of falling?  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), yard work      Cognition   Overall Cognitive Status Within Functional Limits for tasks assessed      Observation/Other Assessments   Focus on Therapeutic Outcomes (FOTO)  55% limited  Sensation   Additional Comments WFL      Posture/Postural Control   Posture Comments roundd shoulders, incr thoracic kyphosis, Lt illiac elevation vs Rt in standing      ROM / Strength   AROM / PROM / Strength PROM      PROM   Overall PROM Comments limited hip flexion Rt vs Lt      Palpation   Palpation comment tightness along Rt thoracolumbar paraspinals                      Objective measurements completed on examination: See above findings.       [facility] Adult PT Treatment/Exercise - 05/18/20 0001      Exercises   Exercises Lumbar      Lumbar Exercises: Stretches   Single Knee to Chest Stretch Limitations knee to shoulder 2 min ea      Lumbar Exercises: Standing   Other Standing Lumbar Exercises ab set with postural alignment      Lumbar Exercises: Seated   Other Seated Lumbar Exercises ab set+scap retraction      Lumbar Exercises: Supine   AB Set Limitations with tactile cues    Bent Knee Raise Limitations ab set with marching     Other Supine Lumbar Exercises hundred beginner 2      Manual Therapy   Manual Therapy Soft tissue mobilization    Soft tissue mobilization Rt thoracolumbar paraspinals                  PT Education - 05/18/20 1300    Education Details anatomy of condition, POC HEP, exercise form/rationale, FOTO    Person(s) Educated Patient    Methods Explanation;Demonstration;Tactile cues;Verbal cues;Handout    Comprehension Verbalized understanding;Returned demonstration;Verbal cues required;Tactile cues required;Need further instruction               PT Long Term Goals - 05/18/20 1254      PT LONG TERM GOAL #1   Title Pt will demo upright posture with proper core contraction    Baseline postural deviations outlined in flowsheet, began educating on core activation today    Time 4    Period Weeks    Status New    Target Date 06/18/20      PT LONG TERM GOAL #2   Title pt will demo proper bend/squat to lift    Baseline has caused back pain with this movement in the past    Time 4    Period Weeks    Status New    Target Date 06/18/20      PT LONG TERM GOAL #3   Title pt will be able to complete her household chores without limitation from back/LE pain by using postures and stretches    Baseline began educating at eval    Time 4    Period Weeks    Status New    Target Date 06/18/20      PT LONG TERM GOAL #4   Title Pt will be indpendent with long term HEP for continued strengthening    Baseline will progress and establish as appropriate    Time 4    Period Weeks    Status New    Target Date 06/18/20                  Plan - 05/18/20 1250    Clinical Impression Statement Pt presents to PT with complaints of Rt-sided LBP with bil radicular symptoms that  runs down her IT bands and stops at her knees. Notable postural deviations and weakness in core that are contributing to her pain. Pt denies doing regular exercise at this time and I encouraged her to find something  she enjoys for motion. She does have a mini trampoline that she will start doing small bounces with core contraction on. Pt will be introduced to pilates as another option for long term exercise. Pt will benefit from skilled PT to address deficits and reach functional goals.    Examination-Activity Limitations Bathing;Sit;Bed Mobility;Sleep;Bend;Squat;Carry;Stand;Lift    Examination-Participation Restrictions Church;Cleaning;Shop;Community Activity;Volunteer;Yard Work    Stability/Clinical Decision Making Stable/Uncomplicated    Clinical Decision Making Low    Rehab Potential Good    PT Frequency 1x / week    PT Duration 4 weeks    PT Treatment/Interventions ADLs/Self Care Home Management;Cryotherapy;Electrical Stimulation;Moist Heat;Therapeutic exercise;Therapeutic activities;Functional mobility training;Traction;Neuromuscular re-education;Patient/family education;Manual techniques;Taping;Dry needling;Passive range of motion;Spinal Manipulations;Joint Manipulations    PT Next Visit Plan pilates, DN PRN, did she try core on trampoline? stretching regimen    PT Home Exercise Plan [email or web address]    Consulted and Agree with Plan of Care Patient           Patient will benefit from skilled therapeutic intervention in order to improve the following deficits and impairments:  Decreased activity tolerance, Decreased strength, Pain, Increased muscle spasms, Improper body mechanics, Postural dysfunction, Impaired flexibility  Visit Diagnosis: Chronic right-sided low back pain without sciatica - Plan: PT plan of care cert/re-cert  Pain in left leg - Plan: PT plan of care cert/re-cert  Pain in right leg - Plan: PT plan of care cert/re-cert  Abnormal posture - Plan: PT plan of care cert/re-cert     Problem List Patient Active Problem List   Diagnosis Date Noted  . ABDOMINAL PAIN 05/01/2010  . DIVERTICULOSIS, COLON 07/26/2008  . HEADACHE 07/26/2008  . DEPRESSION 07/21/2007    Jessica C. Hightower  PT, DPT 05/18/20 1:03 PM   [facility] [address] [place], [place], [address] Phone: [phone]   Fax:  [phone]  Name: [name] MRN: [id number] Date of Birth: 01-28-1951

## 2020-05-24 NOTE — Telephone Encounter (Signed)
Please advise 

## 2020-05-26 ENCOUNTER — Other Ambulatory Visit: Payer: Self-pay

## 2020-05-26 ENCOUNTER — Ambulatory Visit: Payer: Medicare Other | Admitting: Physical Therapy

## 2020-05-26 DIAGNOSIS — R293 Abnormal posture: Secondary | ICD-10-CM

## 2020-05-26 DIAGNOSIS — M79605 Pain in left leg: Secondary | ICD-10-CM

## 2020-05-26 DIAGNOSIS — G8929 Other chronic pain: Secondary | ICD-10-CM | POA: Diagnosis not present

## 2020-05-26 DIAGNOSIS — M545 Low back pain: Secondary | ICD-10-CM | POA: Diagnosis not present

## 2020-05-26 DIAGNOSIS — M79604 Pain in right leg: Secondary | ICD-10-CM

## 2020-05-26 NOTE — Patient Instructions (Addendum)
Access Code: [email or web address]: [email or web address]: 07/15/2021Prepared by: Anderson Malta PaaExercises  Supine Transversus Abdominis Bracing - Hands on Stomach  Hooklying Small March - 1 x daily - 7 x weekly - 3 sets - 10 reps  The Hundred 2 Beginner - 1 x daily - 7 x weekly - 10 sets - 10 reps  Seated Pelvic Tilts  Standing Posterior Pelvic Tilt  Bent Knee Fallouts - 1 x daily - 7 x weekly - 2 sets - 10 reps - 30 hold  Supine Bridge - 1 x daily - 7 x weekly - 2 sets - 10 reps - 30 hold  Supine Hamstring Stretch with Strap - 1 x daily - 7 x weekly - 1 sets - 3 reps - 30 hold  Supine ITB Stretch with Strap - 1 x daily - 7 x weekly - 1 sets - 3 reps - 30 hold

## 2020-05-26 NOTE — Therapy (Signed)
[name], [place], [address] Phone: [phone]   Fax:  [phone]  Physical Therapy Treatment  Patient Details  Name: [name] MRN: [id number] Date of Birth: 1951-05-10 Referring Provider (PT): Grier Mitts, MD   Encounter Date: 05/26/2020   PT End of Session - 05/26/20 1311    Visit Number 2    Number of Visits 5    Date for PT Re-Evaluation 06/18/20    Authorization Type MCR- KX at visit 15    PT Start Time 1312    PT Stop Time 1400    PT Time Calculation (min) 48 min           Past Medical History:  Diagnosis Date  . ABDOMINAL PAIN 05/01/2010  . DEPRESSION 07/21/2007  . DIVERTICULOSIS, COLON 07/26/2008  . Headache(784.0) 07/26/2008  . Pityriasis 09/13/2019    Past Surgical History:  Procedure Laterality Date  . COLONOSCOPY    . ERCP  august 2012  . LAPAROSCOPIC CHOLECYSTECTOMY  08/14/2011  . WISDOM TOOTH EXTRACTION      There were no vitals filed for this visit.   Subjective Assessment - 05/26/20 1318    Subjective Did my exercises already.  Getting a massage later.  I did not sleep very well. She tried the trampoline a bit and it did not aggravate her pain.Pt spent time explaining symptoms and etilogy.    Currently in Pain? Yes    Pain Score 2     Pain Location Back    Pain Orientation Lower    Pain Descriptors / Indicators Aching    Pain Type Chronic pain    Pain Frequency Intermittent    Aggravating Factors  AM hours    Pain Relieving Factors moving a bit              [facility] Adult PT Treatment/Exercise - 05/26/20 0001      Lumbar Exercises: Stretches   Active Hamstring Stretch 2 reps    Active Hamstring Stretch Limitations strap     Single Knee to Chest Stretch 2 reps    Single Knee to Chest Stretch Limitations each side after bridging     ITB Stretch 2 reps;30 seconds      Lumbar Exercises: Supine   Ab Set 10 reps    Pelvic Tilt 10 reps    Clam 15 reps     Clam Limitations bi and then unilateral     Bent Knee Raise 15 reps    Basic Lumbar Stabilization Limitations used core ball to challenge                   PT Education - 05/26/20 1358    Education Details HEP, anterolisthesis, core    Person(s) Educated Patient    Methods Explanation;Demonstration;Handout    Comprehension Verbalized understanding;Returned demonstration               PT Long Term Goals - 05/26/20 1358      PT LONG TERM GOAL #1   Title Pt will demo upright posture with proper core contraction    Status On-going      PT LONG TERM GOAL #2   Title pt will demo proper bend/squat to lift    Status On-going      PT LONG TERM GOAL #3   Title pt will be able to complete her household chores without limitation from back/LE pain by using postures and stretches    Status On-going  PT LONG TERM GOAL #4   Title Pt will be indpendent with long term HEP for continued strengthening    Status On-going                 Plan - 05/26/20 1337    Clinical Impression Statement Reviewed HEP, needed min cues to complete it.  Modified exercises and added stretching that targeted post/lateral hips. She shows significant core weakness.  Cramping after stretching in buttocks and lateral hamstring on the opposite leg begin stretched.    PT Treatment/Interventions ADLs/Self Care Home Management;Cryotherapy;Electrical Stimulation;Moist Heat;Therapeutic exercise;Therapeutic activities;Functional mobility training;Traction;Neuromuscular re-education;Patient/family education;Manual techniques;Taping;Dry needling;Passive range of motion;Spinal Manipulations;Joint Manipulations    PT Next Visit Plan pilates, DN PRN,  stretching regimen    PT Home Exercise Plan [email or web address]    Consulted and Agree with Plan of Care Patient           Patient will benefit from skilled therapeutic intervention in order to improve the following deficits and impairments:  Decreased activity  tolerance, Decreased strength, Pain, Increased muscle spasms, Improper body mechanics, Postural dysfunction, Impaired flexibility  Visit Diagnosis: Chronic right-sided low back pain without sciatica  Pain in left leg  Pain in right leg  Abnormal posture     Problem List Patient Active Problem List   Diagnosis Date Noted  . ABDOMINAL PAIN 05/01/2010  . DIVERTICULOSIS, COLON 07/26/2008  . HEADACHE 07/26/2008  . DEPRESSION 07/21/2007    Olla Delancey 05/26/2020, 8:18 PM  [facility] [address] [place], [place], [address] Phone: [phone]   Fax:  [phone]  Name: [name] MRN: [id number] Date of Birth: Jan 08, 1951  Raeford Razor, PT 05/26/20 8:18 PM Phone: [phone] Fax: [phone]

## 2020-06-01 ENCOUNTER — Other Ambulatory Visit: Payer: Self-pay

## 2020-06-01 ENCOUNTER — Ambulatory Visit (INDEPENDENT_AMBULATORY_CARE_PROVIDER_SITE_OTHER): Payer: Medicare Other | Admitting: Family Medicine

## 2020-06-01 ENCOUNTER — Encounter: Payer: Self-pay | Admitting: Family Medicine

## 2020-06-01 VITALS — BP 110/78 | HR 105 | Temp 98.3°F | Wt 161.0 lb

## 2020-06-01 DIAGNOSIS — L821 Other seborrheic keratosis: Secondary | ICD-10-CM

## 2020-06-01 DIAGNOSIS — B070 Plantar wart: Secondary | ICD-10-CM

## 2020-06-01 NOTE — Patient Instructions (Addendum)
Seborrheic Keratosis A seborrheic keratosis is a common, noncancerous (benign) skin growth. These growths are velvety, waxy, rough, tan, brown, or black spots that appear on the skin. These skin growths can be flat or raised, and scaly. What are the causes? The cause of this condition is not known. What increases the risk? You are more likely to develop this condition if you:  Have a family history of seborrheic keratosis.  Are 50 or older.  Are pregnant.  Have had estrogen replacement therapy. What are the signs or symptoms? Symptoms of this condition include growths on the face, chest, shoulders, back, or other areas. These growths:  Are usually painless, but may become irritated and itchy.  Can be yellow, brown, black, or other colors.  Are slightly raised or have a flat surface.  Are sometimes rough or wart-like in texture.  Are often velvety or waxy on the surface.  Are round or oval-shaped.  Often occur in groups, but may occur as a single growth. How is this diagnosed? This condition is diagnosed with a medical history and physical exam.  A sample of the growth may be tested (skin biopsy).  You may need to see a skin specialist (dermatologist). How is this treated? Treatment is not usually needed for this condition, unless the growths are irritated or bleed often.  You may also choose to have the growths removed if you do not like their appearance. ? Most commonly, these growths are treated with a procedure in which liquid nitrogen is applied to "freeze" off the growth (cryosurgery). ? They may also be burned off with electricity (electrocautery) or removed by scraping (curettage). Follow these instructions at home:  Watch your growth for any changes.  Keep all follow-up visits as told by your health care provider. This is important.  Do not scratch or pick at the growth or growths. This can cause them to become irritated or infected. Contact a health care  provider if:  You suddenly have many new growths.  Your growth bleeds, itches, or hurts.  Your growth suddenly becomes larger or changes color. Summary  A seborrheic keratosis is a common, noncancerous (benign) skin growth.  Treatment is not usually needed for this condition, unless the growths are irritated or bleed often.  Watch your growth for any changes.  Contact a health care provider if you suddenly have many new growths or your growth suddenly becomes larger or changes color.  Keep all follow-up visits as told by your health care provider. This is important. This information is not intended to replace advice given to you by your health care provider. Make sure you discuss any questions you have with your health care provider. Document Revised: 03/13/2018 Document Reviewed: 03/13/2018 Elsevier Patient Education  [place].  Actinic Keratosis An actinic keratosis is a precancerous growth on the skin. If there is more than one growth, the condition is called actinic keratoses. Actinic keratoses appear most often on areas of skin that get a lot of sun exposure, including the scalp, face, ears, lips, upper back, forearms, and the backs of the hands. If left untreated, these growths may develop into a skin cancer called squamous cell carcinoma. It is important to have all these growths checked by a health care provider to determine the best treatment approach. What are the causes? Actinic keratoses are caused by getting too much ultraviolet (UV) radiation from the sun or other UV light sources. What increases the risk? You are more likely to develop this condition  if you:  Have light-colored skin and blue eyes.  Have blond or red hair.  Spend a lot of time in the sun.  Do not protect your skin from the sun when outdoors.  Are an older person. The risk of developing an actinic keratosis increases with age. What are the signs or symptoms? Actinic keratoses feel like  scaly, rough spots of skin. Symptoms of this condition include growths that may:  Be as small as a pinhead or as big as a quarter.  Itch, hurt, or feel sensitive.  Be skin-colored, light tan, dark tan, pink, or a combination of any of these colors. In most cases, the growths become red.  Have a small piece of pink or gray skin (skin tag) growing from them. It may be easier to notice actinic keratoses by feeling them, rather than seeing them. Sometimes, actinic keratoses disappear, but many reappear a few days to a few weeks later. How is this diagnosed? This condition is usually diagnosed with a physical exam.  A tissue sample may be removed from the actinic keratosis and examined under a microscope (biopsy). How is this treated? If needed, this condition may be treated by:  Scraping off the actinic keratosis (curettage).  Freezing the actinic keratosis with liquid nitrogen (cryosurgery). This causes the growth to eventually fall off the skin.  Applying medicated creams or gels to destroy the cells in the growth.  Applying chemicals to the actinic keratosis to make the outer layers of skin peel off (chemical peel).  Using photodynamic therapy. In this procedure, medicated cream is applied to the actinic keratosis. This cream increases your skin's sensitivity to light. Then, a strong light is aimed at the actinic keratosis to destroy cells in the growth. Follow these instructions at home: Skin care  Apply cool, wet cloths (cool compresses) to the affected areas.  Do not scratch your skin.  Check your skin regularly for any growths, especially growths that: ? Start to itch or bleed. ? Change in size, shape, or color. Caring for the treated area  Keep the treated area clean and dry as told by your health care provider.  Do not apply any medicine, cream, or lotion to the treated area unless your health care provider tells you to do that.  Do not pick at blisters or try to break  them open. This can cause infection and scarring.  If you have red or irritated skin after treatment, follow instructions from your health care provider about how to take care of the treated area. Make sure you: ? Wash your hands with soap and water before you change your bandage (dressing). If soap and water are not available, use hand sanitizer. ? Change your dressing as told by your health care provider.  If you have red or irritated skin after treatment, check your treated area every day for signs of infection. Check for: ? Redness, swelling, or pain. ? Fluid or blood. ? Warmth. ? Pus or a bad smell. General instructions  Take or apply over-the-counter and prescription medicines only as told by your health care provider.  Return to your normal activities as told by your health care provider. Ask your health care provider what activities are safe for you.  Have a skin exam done every year by a health care provider who is a skin specialist (dermatologist).  Keep all follow-up visits as told by your health care provider. This is important. Lifestyle  Do not use any products that contain nicotine or tobacco,  such as cigarettes and e-cigarettes. If you need help quitting, ask your health care provider.  Take steps to protect your skin from the sun. ? Try to avoid the sun between 10:00 a.m. and 4:00 p.m. This is when the UV light is the strongest. ? Use a sunscreen or sunblock with SPF 30 (sun protection factor 30) or greater. ? Apply sunscreen before you are exposed to sunlight and reapply as often as directed by the instructions on the sunscreen container. ? Always wear sunglasses that have UV protection, and always wear a hat and clothing to protect your skin from sunlight. ? When possible, avoid medicines that increase your sensitivity to sunlight. ? Do not use tanning beds or other indoor tanning devices. Contact a health care provider if:  You notice any changes or new growths on  your skin.  You have swelling, pain, or more redness around your treated area.  You have fluid or blood coming from your treated area.  Your treated area feels warm to the touch.  You have pus or a bad smell coming from your treated area.  You have a fever.  You have a blister that becomes large and painful. Summary  An actinic keratosis is a precancerous growth on the skin. If there is more than one growth, the condition is called actinic keratoses. In some cases, if left untreated, these growths can develop into skin cancer.  Check your skin regularly for any growths, especially growths that start to itch or bleed, or change in size, shape, or color.  Take steps to protect your skin from the sun.  Contact a health care provider if you notice any changes or new growths on your skin.  Keep all follow-up visits as told by your health care provider. This is important. This information is not intended to replace advice given to you by your health care provider. Make sure you discuss any questions you have with your health care provider. Document Revised: 03/11/2018 Document Reviewed: 03/11/2018 Elsevier Patient Education  [place].  Plantar Warts Plantar warts are small growths on the bottom of the foot (sole). Warts are caused by a type of germ (virus). Most warts are not painful, and they usually do not cause problems. Sometimes, plantar warts can cause pain when you walk. Warts often go away on their own in time. They can also spread to other areas of the body. Treatments may be done if needed. What are the causes?  Plantar warts are caused by a germ that is called human papillomavirus (HPV). ? Walking barefoot can cause exposure to the germ, especially if your feet are wet. ? Warts happen when HPV attacks a break in the skin of the foot. What increases the risk?  Being between 80-63 years of age.  Using public showers or locker rooms.  Having a weakened body  defense system (immune system). What are the signs or symptoms?   Flat or slightly raised growths that have a rough surface and look like a callus.  Pain when you use your foot to support your body weight. How is this treated? In many cases, warts do not need treatment. Without treatment, they often go away with time. If treatment is needed or wanted, options may include:  Applying medicated solutions, creams, or patches to the wart. These make the skin soft so that layers will slowly shed away.  Freezing the wart with liquid nitrogen (cryotherapy).  Burning the wart with: ? Laser treatment. ? An electrified probe (  electrocautery).  Injecting a medicine (Candida antigen) into the wart to help the body's defense system fight off the wart.  Having surgery to remove the wart.  Putting duct tape over the top of the wart (occlusion). You will leave the tape in place for as long as told by your doctor. Then you will replace it with a new strip of tape. This is done until the wart goes away. Repeat treatment may be needed if you choose to remove warts. Warts sometimes go away and come back again. Follow these instructions at home: General instructions  Apply creams or solutions only as told by your doctor. Follow these steps if your doctor tells you to do so: ? Soak your foot in warm water. ? Remove the top layer of softened skin before you apply the medicine. You can use a pumice stone to remove the skin. ? After you apply the medicine, put a bandage over the area of the wart. ? Repeat the process every day or as told by your doctor.  Do not scratch or pick at a wart.  Wash your hands after you touch a wart.  If a wart hurts, try covering it with a bandage that has a hole in the middle.  Keep all follow-up visits as told by your doctor. This is important. How is this prevented?   Wear shoes and socks. Change your socks every day.  Keep your feet clean and dry.  Check your  feet often.  Do not walk barefoot in: ? Shared locker rooms. ? Shower areas. ? Swimming pools.  Avoid direct contact with warts on other people. Contact a doctor if:  Your warts do not improve after treatment.  You have redness, swelling, or pain at the site of a wart.  You have bleeding from a wart, and the bleeding does not stop when you put light pressure on the wart.  You have diabetes and you get a wart. Summary  Warts are small growths on the skin.  When warts happen on the bottom of the foot (sole), they are called plantar warts.  In many cases, warts do not need treatment.  Apply creams or solutions only as told by your doctor.  Do not scratch or pick at a wart. Wash your hands after you touch a wart. This information is not intended to replace advice given to you by your health care provider. Make sure you discuss any questions you have with your health care provider. Document Revised: 08/07/2018 Document Reviewed: 08/07/2018 Elsevier Patient Education  [place].  Cryoablation Cryoablation is a procedure used to remove abnormal growths or cancerous tissue. This is done by freezing the growth or tissue with either liquid nitrogen or argon gas. This procedure may be done to treat many conditions, including:  Skin tumors.  Non-cancerous (benign) nodules.  A type of eye cancer (retinoblastoma).  Cancers of the prostate, liver, kidney, cervix, lung, and bone. Tell a health care provider about:  Any allergies you have.  All medicines you are taking, including vitamins, herbs, eye drops, creams, and over-the-counter medicines.  Any problems you or family members have had with anesthetic medicines.  Any blood disorders you have.  Any surgeries you have had.  Any medical conditions you have.  Whether you are pregnant or may be pregnant. What are the risks? Generally, this is a safe procedure. However, problems may occur,  including:  Infection.  Bleeding.  Swelling.  Nerve damage and loss of feeling. This is rare.  Allergic reactions to medicines.  Damage to other structures or organs. What happens before the procedure? Staying hydrated Follow instructions from your health care provider about hydration, which may include:  Up to 2 hours before the procedure - you may continue to drink clear liquids, such as water, clear fruit juice, black coffee, and plain tea. Eating and drinking restrictions Follow instructions from your health care provider about eating and drinking, which may include:  8 hours before the procedure - stop eating heavy meals or foods such as meat, fried foods, or fatty foods.  6 hours before the procedure - stop eating light meals or foods, such as toast or cereal.  6 hours before the procedure - stop drinking milk or drinks that contain milk.  2 hours before the procedure - stop drinking clear liquids. Medicines  Ask your health care provider about: ? Changing or stopping your regular medicines. This is especially important if you are taking diabetes medicines or blood thinners. ? Taking medicines such as aspirin and ibuprofen. These medicines can thin your blood. Do not take these medicines before your procedure if your health care provider instructs you not to.  You may be given antibiotic medicine to help prevent infection. General instructions  You may have blood tests.  Plan to have someone take you home from the hospital or clinic.  If you will be going home right after the procedure, plan to have someone with you for 24 hours.  Ask your health care provider how your surgical site will be marked or identified. What happens during the procedure?  To reduce your risk of infection: ? Your health care team will wash or sanitize their hands. ? Your skin will be washed with soap. ? Hair may be removed from the surgical area.  An IV tube will be inserted into one  of your veins.  You will be given one or more of the following: ? A medicine to help you relax (sedative). ? A medicine to numb the area (local anesthetic). ? A medicine to make you fall asleep (general anesthetic). ? A medicine that is injected into your spine to numb the area below and slightly above the injection site (spinal anesthetic). ? A medicine that is injected into an area of your body to numb everything below the injection site (regional anesthetic).  Depending on the location of the growth, a small incision may be made. A bronchoscope may be used for a lung tumor, or a laparoscope may be used for an abdominal tumor.  Your health care provider may use a device (cryoprobe) that has liquid nitrogen or argon gas flowing through it. The cryoprobe will be inserted through the incision to the area where the tumor is located. This may be done with guidance from imaging such as an ultrasound, CT scan, or MRI scan.  Liquid nitrogen or argon gas will be delivered through the cryoprobe to the growth until it is frozen and destroyed.  The process may be repeated on other areas depending on how many areas need treatment.  The cryoprobe will be removed and pressure will be applied to stop any bleeding.  The incision will be closed with stitches (sutures).  The incision will be covered with a bandage (dressing). The procedure will vary depending on the location of the tumor or nodule. The procedure may also vary among health care providers and hospitals. What happens after the procedure?  Do not drive for 24 hours if you received a sedative.  Your  blood pressure, heart rate, breathing rate, and blood oxygen level will be monitored until the medicines you were given have worn off.  You will be given medicine to help with pain, nausea, and vomiting as needed. This information is not intended to replace advice given to you by your health care provider. Make sure you discuss any questions you  have with your health care provider. Document Revised: 10/11/2017 Document Reviewed: 03/28/2016 Elsevier Patient Education  2020 [place].  Cryosurgery for Skin Conditions, Care After This sheet gives you information about how to care for yourself after your procedure. Your health care provider may also give you more specific instructions. If you have problems or questions, contact your health care provider. What can I expect after the procedure? After your procedure, it is common to have redness, swelling, and a blister that forms over the treated area. The blister may contain a small amount of blood. After about 2 weeks, the blister will break on its own, leaving a scab. Then the treated area will heal. After healing, there is usually little or no scarring. Follow these instructions at home: Caring for the treated area   Follow instructions from your health care provider about how to take care of the treated area. Make sure you: ? Keep the area covered with a bandage (dressing) until it heals, or for as long as told by your health care provider. ? Wash your hands with soap and water before you change your dressing. If soap and water are not available, use hand sanitizer. ? Change your dressing as told by your health care provider. ? Keep the dressing and the treated area clean and dry. If the dressing gets wet, change it right away. ? Clean the treated area with soap and water.  Check the treated area every day for signs of infection. Check for: ? More redness, swelling, or pain. ? More fluid or blood. ? Warmth. ? Pus or a bad smell. General instructions  Do not pick at your blister or try to break it open. This can cause infection and scarring.  Do not apply any medicine, cream, or lotion to the treated area unless directed by your health care provider.  Take over-the-counter and prescription medicines only as told by your health care provider.  Keep all follow-up visits as  told by your health care provider. This is important. Contact a health care provider if:  You have more redness, swelling, or pain around the treated area.  You have more fluid or blood coming from the treated area.  The treated area feels warm to the touch.  You have pus or a bad smell coming from the treated area.  Your blister becomes large and painful. Get help right away if:  You have a fever and have redness spreading from the treated area. Summary  The treated area will become red and swollen shortly after the procedure.  You should keep the treated area and your dressing clean and dry.  Check the treated area every day for signs of infection, such as fluid, pus, warmth, or having more redness, swelling, or pain.  Do not pick at your blister or try to break it open. This information is not intended to replace advice given to you by your health care provider. Make sure you discuss any questions you have with your health care provider. Document Revised: 10/11/2017 Document Reviewed: 09/17/2016 Elsevier Patient Education  2020 [employer].

## 2020-06-01 NOTE — Progress Notes (Signed)
Subjective:    Patient ID: [name], female    DOB: 01-18-1951, 69 y.o.   MRN: [id number]  No chief complaint on file.   HPI Patient was seen today for ongoing concern.  Patient endorses large mole on right lower back.  Patient denies pain, bleeding, irritation, changes but wanted to have it checked at the request of her husband.  Area has been present times years.  Patient also notes pain white, dry, appearing patch on top of left foot.  Past Medical History:  Diagnosis Date  . ABDOMINAL PAIN 05/01/2010  . DEPRESSION 07/21/2007  . DIVERTICULOSIS, COLON 07/26/2008  . Headache(784.0) 07/26/2008  . Pityriasis 09/13/2019    Allergies  Allergen Reactions  . Sulfamethoxazole Hives    Happened in childhood, does not remember reaction.    ROS General: Denies fever, chills, night sweats, changes in weight, changes in appetite HEENT: Denies headaches, ear pain, changes in vision, rhinorrhea, sore throat CV: Denies CP, palpitations, SOB, orthopnea Pulm: Denies SOB, cough, wheezing GI: Denies abdominal pain, nausea, vomiting, diarrhea, constipation GU: Denies dysuria, hematuria, frequency, vaginal discharge Msk: Denies muscle cramps, joint pains Neuro: Denies weakness, numbness, tingling Skin: Denies rashes, bruising  + skin lesion on left foot, and mole on right lower back. Psych: Denies depression, anxiety, hallucinations     Objective:    Blood pressure 110/78, pulse (!) 105, temperature 98.3 F (36.8 C), temperature source Oral, weight 161 lb (73 kg), SpO2 96 %.   Gen. Pleasant, well-nourished, in no distress, normal affect   HEENT: [place]/AT, face symmetric, no scleral icterus, PERRLA, EOMI, nares patent without drainage Lungs: no accessory muscle use, CTAB, no wheezes or rales Cardiovascular: RRR, no m/r/g, no peripheral edema Musculoskeletal: No deformities, no cyanosis or clubbing, normal tone Neuro:  A&Ox3, CN II-XII intact, normal gait Skin:  Warm, no lesions/ rash 2.5  cm x 7 mm slightly raised brown hyperpigmented papule with visible portions on medial right lower back.  Normal appearing border and color.  No fluid with 6 mm white, dry, scaly appearing lesion with a small pinpoint white lesions adjacent.  No erythema, induration, or drainage noted either lesion   Wt Readings from Last 3 Encounters:  06/01/20 161 lb (73 kg)  03/14/20 159 lb (72.1 kg)  11/19/19 150 lb (68 kg)    Lab Results  Component Value Date   WBC 4.8 08/08/2011   HGB 13.3 08/08/2011   HCT 39.6 08/08/2011   PLT 230 08/08/2011   GLUCOSE 98 07/23/2019   CHOL 151 07/23/2019   TRIG 92.0 07/23/2019   HDL 61.30 07/23/2019   LDLCALC 71 07/23/2019   ALT 10 08/08/2011   AST 19 08/08/2011   NA 142 07/23/2019   K 4.3 07/23/2019   CL 105 07/23/2019   CREATININE 0.86 07/23/2019   BUN 15 07/23/2019   CO2 29 07/23/2019   TSH 1.58 07/21/2008   INR 0.97 06/29/2011  Cryotherapy  Reason: Skin lesions Location: Right lower back and left dorsal surface of foot  Consent obtained.  R/b/a reviewed.  Liquid nitrogen was applied using the liquid nitrogen gun without difficulty. Tolerated well without complications.  After care reviewed.   Assessment/Plan:  Seborrheic keratosis -Consent obtained.  Cryotherapy done.  Patient tolerated procedure well. -Discussed care -Given handouts -Continue to monitor  Verruca plantaris -Consent obtained.  Cryotherapy done.  Patient tolerated procedure well. -Discussed care -Given handouts -Continue to monitor  F/u as needed  Grier Mitts, MD

## 2020-06-03 ENCOUNTER — Other Ambulatory Visit: Payer: Self-pay

## 2020-06-03 ENCOUNTER — Ambulatory Visit: Payer: Medicare Other | Admitting: Physical Therapy

## 2020-06-03 ENCOUNTER — Encounter: Payer: Self-pay | Admitting: Physical Therapy

## 2020-06-03 DIAGNOSIS — M545 Low back pain: Secondary | ICD-10-CM | POA: Diagnosis not present

## 2020-06-03 DIAGNOSIS — M79605 Pain in left leg: Secondary | ICD-10-CM

## 2020-06-03 DIAGNOSIS — M79604 Pain in right leg: Secondary | ICD-10-CM | POA: Diagnosis not present

## 2020-06-03 DIAGNOSIS — R293 Abnormal posture: Secondary | ICD-10-CM | POA: Diagnosis not present

## 2020-06-03 DIAGNOSIS — G8929 Other chronic pain: Secondary | ICD-10-CM

## 2020-06-03 NOTE — Therapy (Signed)
[place], [place], [address] Phone: [phone]   Fax:  [phone]  Physical Therapy Treatment  Patient Details  Name: [name] MRN: [id number] Date of Birth: 1951/03/06 Referring Provider (PT): Grier Mitts, MD   Encounter Date: 06/03/2020   PT End of Session - 06/03/20 1329    Visit Number 3    Number of Visits 5    Date for PT Re-Evaluation 06/18/20    Authorization Type MCR- KX at visit 15    PT Start Time 1007    PT Stop Time 1047    PT Time Calculation (min) 40 min    Activity Tolerance Patient tolerated treatment well    Behavior During Therapy [facility] for tasks assessed/performed           Past Medical History:  Diagnosis Date  . ABDOMINAL PAIN 05/01/2010  . DEPRESSION 07/21/2007  . DIVERTICULOSIS, COLON 07/26/2008  . Headache(784.0) 07/26/2008  . Pityriasis 09/13/2019    Past Surgical History:  Procedure Laterality Date  . COLONOSCOPY    . ERCP  august 2012  . LAPAROSCOPIC CHOLECYSTECTOMY  08/14/2011  . WISDOM TOOTH EXTRACTION      There were no vitals filed for this visit.   Subjective Assessment - 06/03/20 1010    Subjective It takes about an hour to get loosened up. Back is sore but thats not pain. Had a massage and it didnt really help.    Diagnostic tests 3 mm grade 1 anterolisthesis L4on L5. Mild intervertebral disc height loss at L4-5 and L5-S1.Minimal degenerative endplate changes. Mild lower lumbar facetarthrosis.    Patient Stated Goals walking, decrease pain & get back to exercising & getting on with life    Currently in Pain? No/denies               [facility] Adult PT Treatment/Exercise - 06/03/20 0001      Lumbar Exercises: Stretches   Active Hamstring Stretch Right;Left;2 reps;30 seconds    Active Hamstring Stretch Limitations standing     Passive Hamstring Stretch Limitations strap and ITB x 2 each side     Quadruped Mid Back Stretch 5 reps;10 seconds     Other Lumbar Stretch Exercise flat back fold over on counter top       Lumbar Exercises: Aerobic   Stationary Bike 5 min L2 intervals       Lumbar Exercises: Supine   Bent Knee Raise 15 reps    Bent Knee Raise Limitations used ball under mid back     Bridge with [employer] 10 reps    Straight Leg Raise 10 reps    Straight Leg Raises Limitations in ab curl over ball     Other Supine Lumbar Exercises upper ab curl x 10 over ball     Other Supine Lumbar Exercises thoracic extension       Lumbar Exercises: Quadruped   [place] 5 reps    Single Arm Raise Right;Left;5 reps    Single Arm Raises Limitations with ball squeeze     Straight Leg Raise 5 reps    Opposite Arm/Leg Raise Right arm/Left leg;Left arm/Right leg    Opposite Arm/Leg Raise Limitations 3 reps, unstable                   PT Education - 06/03/20 1022    Education Details Core, XR, referred pain, muscle tightness, standing stretch in AM before sitting with coffee    Person(s) Educated Patient  Methods Explanation    Comprehension Verbalized understanding;Returned demonstration               PT Long Term Goals - 05/26/20 1358      PT LONG TERM GOAL #1   Title Pt will demo upright posture with proper core contraction    Status On-going      PT LONG TERM GOAL #2   Title pt will demo proper bend/squat to lift    Status On-going      PT LONG TERM GOAL #3   Title pt will be able to complete her household chores without limitation from back/LE pain by using postures and stretches    Status On-going      PT LONG TERM GOAL #4   Title Pt will be indpendent with long term HEP for continued strengthening    Status On-going                 Plan - 06/03/20 1204    Clinical Impression Statement Reviewed options for standing stretches for AM to avoid the pain she has when she sits to drink her coffee.  Needs cues for neutral spine "flat back" with quadruped.  Core ball was effective for  flexion based strengthening of abdominals.  Improving gradually with PT visits.    PT Treatment/Interventions ADLs/Self Care Home Management;Cryotherapy;Electrical Stimulation;Moist Heat;Therapeutic exercise;Therapeutic activities;Functional mobility training;Traction;Neuromuscular re-education;Patient/family education;Manual techniques;Taping;Dry needling;Passive range of motion;Spinal Manipulations;Joint Manipulations    PT Next Visit Plan pilates, DN PRN,  stretching regimen    PT Home Exercise Plan [id number]    Consulted and Agree with Plan of Care Patient           Patient will benefit from skilled therapeutic intervention in order to improve the following deficits and impairments:  Decreased activity tolerance, Decreased strength, Pain, Increased muscle spasms, Improper body mechanics, Postural dysfunction, Impaired flexibility  Visit Diagnosis: Chronic right-sided low back pain without sciatica  Pain in left leg  Pain in right leg  Abnormal posture     Problem List Patient Active Problem List   Diagnosis Date Noted  . ABDOMINAL PAIN 05/01/2010  . DIVERTICULOSIS, COLON 07/26/2008  . HEADACHE 07/26/2008  . DEPRESSION 07/21/2007    Ralyn Stlaurent 06/03/2020, 1:31 PM  [facility] [address] [place], [place], [address] Phone: [phone]   Fax:  [phone]  Name: [name] MRN: [id number] Date of Birth: 08-21-1951  Raeford Razor, PT 06/03/20 1:32 PM Phone: [phone] Fax: [phone]

## 2020-06-03 NOTE — Telephone Encounter (Signed)
Patient seen in the office for this matter.

## 2020-06-09 ENCOUNTER — Telehealth: Payer: Self-pay | Admitting: Family Medicine

## 2020-06-09 NOTE — Telephone Encounter (Signed)
Spoke with patient she was not at home and will call back to schedule AWV

## 2020-06-10 ENCOUNTER — Ambulatory Visit: Payer: Medicare Other | Admitting: Physical Therapy

## 2020-06-10 ENCOUNTER — Other Ambulatory Visit: Payer: Self-pay

## 2020-06-10 ENCOUNTER — Encounter: Payer: Self-pay | Admitting: Physical Therapy

## 2020-06-10 DIAGNOSIS — M79604 Pain in right leg: Secondary | ICD-10-CM

## 2020-06-10 DIAGNOSIS — M545 Low back pain: Secondary | ICD-10-CM | POA: Diagnosis not present

## 2020-06-10 DIAGNOSIS — R293 Abnormal posture: Secondary | ICD-10-CM

## 2020-06-10 DIAGNOSIS — G8929 Other chronic pain: Secondary | ICD-10-CM | POA: Diagnosis not present

## 2020-06-10 DIAGNOSIS — M79605 Pain in left leg: Secondary | ICD-10-CM | POA: Diagnosis not present

## 2020-06-10 NOTE — Therapy (Signed)
[place], [place], [address] Phone: [phone]   Fax:  [phone]  Physical Therapy Treatment  Patient Details  Name: [name] MRN: [id number] Date of Birth: May 14, 1951 Referring Provider (PT): Grier Mitts, MD   Encounter Date: 06/10/2020   PT End of Session - 06/10/20 0837    Visit Number 4    Number of Visits 5    Date for PT Re-Evaluation 06/18/20    Authorization Type MCR- KX at visit 15    PT Start Time 0930    PT Stop Time 1020    PT Time Calculation (min) 50 min    Activity Tolerance Patient tolerated treatment well    Behavior During Therapy [facility] for tasks assessed/performed           Past Medical History:  Diagnosis Date  . ABDOMINAL PAIN 05/01/2010  . DEPRESSION 07/21/2007  . DIVERTICULOSIS, COLON 07/26/2008  . Headache(784.0) 07/26/2008  . Pityriasis 09/13/2019    Past Surgical History:  Procedure Laterality Date  . COLONOSCOPY    . ERCP  august 2012  . LAPAROSCOPIC CHOLECYSTECTOMY  08/14/2011  . WISDOM TOOTH EXTRACTION      There were no vitals filed for this visit.   Subjective Assessment - 06/10/20 0835    Subjective Did my exercises today, L leg is tight and it bothers me every single morning. Using Lumbar support.    Currently in Pain? No/denies                [facility] Adult PT Treatment/Exercise - 06/10/20 0001      Pilates   Pilates Reformer see note      Lumbar Exercises: Stretches   Figure 4 Stretch 2 reps;30 seconds    Gastroc Stretch 2 reps    Gastroc Stretch Limitations 30 sec       Lumbar Exercises: Aerobic   Tread Mill 6 min 2.1 mph for warm up            Pilates Reformer used for LE/core strength, postural strength, lumbopelvic disassociation and core control.  Exercises included:  Footwork  Double 2 red 1 blue heels in parallel and turnout  Toes in parallel and turnout   Single leg x 10 2 Red 1 blue, cues for level pelvis    Bridging 2 Red 1 Blue x 10 focus on articulation  Feet in Straps 1 Red 1 yellow arcs in parallel and in turnout  X 15 each , good focus and stability noted in pelvis.       PT Long Term Goals - 05/26/20 1358      PT LONG TERM GOAL #1   Title Pt will demo upright posture with proper core contraction    Status On-going      PT LONG TERM GOAL #2   Title pt will demo proper bend/squat to lift    Status On-going      PT LONG TERM GOAL #3   Title pt will be able to complete her household chores without limitation from back/LE pain by using postures and stretches    Status On-going      PT LONG TERM GOAL #4   Title Pt will be indpendent with long term HEP for continued strengthening    Status On-going                 Plan - 06/10/20 0851    Clinical Impression Statement Patient is improving, knows how to manage her pain  and is left with mild posterolateral knee pain until about 8-9 o'clock . She was introduced to the Pilates environment with simple level 1 exercises, no pain at all during.  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    PT Treatment/Interventions ADLs/Self Care Home Management;Cryotherapy;Electrical Stimulation;Moist Heat;Therapeutic exercise;Therapeutic activities;Functional mobility training;Traction;Neuromuscular re-education;Patient/family education;Manual techniques;Taping;Dry needling;Passive range of motion;Spinal Manipulations;Joint Manipulations    PT Next Visit Plan how was pilates, DN PRN,  stretching regimen    PT Home Exercise Plan [id number]    Consulted and Agree with Plan of Care Patient           Patient will benefit from skilled therapeutic intervention in order to improve the following deficits and impairments:  Decreased activity tolerance, Decreased strength, Pain, Increased muscle spasms, Improper body mechanics, Postural dysfunction, Impaired flexibility  Visit  Diagnosis: Chronic right-sided low back pain without sciatica  Pain in left leg  Pain in right leg  Abnormal posture     Problem List Patient Active Problem List   Diagnosis Date Noted  . ABDOMINAL PAIN 05/01/2010  . DIVERTICULOSIS, COLON 07/26/2008  . HEADACHE 07/26/2008  . DEPRESSION 07/21/2007    Izic Stfort 06/10/2020, 10:37 AM  [facility] [address] [place], [place], [address] Phone: [phone]   Fax:  [phone]  Name: [name] MRN: [id number] Date of Birth: November 15, 1950  Raeford Razor, PT 06/10/20 10:37 AM Phone: [phone] Fax: [phone]

## 2020-06-14 ENCOUNTER — Other Ambulatory Visit: Payer: Self-pay

## 2020-06-14 ENCOUNTER — Encounter: Payer: Self-pay | Admitting: Physical Therapy

## 2020-06-14 ENCOUNTER — Ambulatory Visit: Payer: Medicare Other | Attending: Family Medicine | Admitting: Physical Therapy

## 2020-06-14 DIAGNOSIS — M545 Low back pain, unspecified: Secondary | ICD-10-CM

## 2020-06-14 DIAGNOSIS — M79604 Pain in right leg: Secondary | ICD-10-CM | POA: Insufficient documentation

## 2020-06-14 DIAGNOSIS — G8929 Other chronic pain: Secondary | ICD-10-CM | POA: Diagnosis not present

## 2020-06-14 DIAGNOSIS — R293 Abnormal posture: Secondary | ICD-10-CM | POA: Insufficient documentation

## 2020-06-14 DIAGNOSIS — M79605 Pain in left leg: Secondary | ICD-10-CM | POA: Diagnosis not present

## 2020-06-14 NOTE — Therapy (Signed)
[place], [place], [address] Phone: [phone]   Fax:  [phone]  Physical Therapy Treatment/Discharge  Patient Details  Name: [name] MRN: [id number] Date of Birth: 1950-12-28 Referring Provider (PT): Grier Mitts, MD   Encounter Date: 06/14/2020   PT End of Session - 06/14/20 1012    Visit Number 5    Number of Visits 5    Date for PT Re-Evaluation 06/18/20    Authorization Type MCR- KX at visit 15    PT Start Time 1005    PT Stop Time 1058    PT Time Calculation (min) 53 min    Activity Tolerance Patient tolerated treatment well    Behavior During Therapy [facility] for tasks assessed/performed           Past Medical History:  Diagnosis Date  . ABDOMINAL PAIN 05/01/2010  . DEPRESSION 07/21/2007  . DIVERTICULOSIS, COLON 07/26/2008  . Headache(784.0) 07/26/2008  . Pityriasis 09/13/2019    Past Surgical History:  Procedure Laterality Date  . COLONOSCOPY    . ERCP  august 2012  . LAPAROSCOPIC CHOLECYSTECTOMY  08/14/2011  . WISDOM TOOTH EXTRACTION      There were no vitals filed for this visit.   Subjective Assessment - 06/14/20 1010    Subjective I did not use heat on my back and I can tell.  Stiff Rt side of low back. I am 80% better.  I think eventually it will go away. L side worse than Rt, (hamstring) .    Currently in Pain? No/denies               [facility] Adult PT Treatment/Exercise - 06/14/20 0001      Lumbar Exercises: Stretches   Active Hamstring Stretch Right;Left;3 reps;30 seconds    Passive Hamstring Stretch Limitations strap and ITB x 2 each side     Figure 4 Stretch 2 reps;30 seconds    Figure 4 Stretch Limitations seated     Other Lumbar Stretch Exercise sidelying Rt trunk stretch with manual       Lumbar Exercises: Aerobic   Nustep LE only L7 for 5 min       Lumbar Exercises: Machines for Strengthening   Cybex Knee Extension 15 lbs x 10 x 2     Cybex Knee  Flexion 20 lbs 2x 15    Leg Press 2 plates x 15    cue for core      Modalities   Modalities Moist Heat      Moist Heat Therapy   Number Minutes Moist Heat 10 Minutes    Moist Heat Location Lumbar Spine      Manual Therapy   Manual therapy comments in sidelying over pillow     Soft tissue mobilization lumbar paraspinals and QL with compression and ilial distraction to elongate Rt side                   PT Education - 06/14/20 1025    Education Details principles of overload, weight machines, quadratus lumborum    Person(s) Educated Patient    Methods Explanation;Demonstration    Comprehension Verbalized understanding;Returned demonstration               PT Long Term Goals - 06/14/20 1027      PT LONG TERM GOAL #1   Title Pt will demo upright posture with proper core contraction    Status Achieved      PT LONG TERM  GOAL #2   Title pt will demo proper bend/squat to lift    Status Achieved      PT LONG TERM GOAL #3   Title pt will be able to complete her household chores without limitation from back/LE pain by using postures and stretches    Status Achieved      PT LONG TERM GOAL #4   Title Pt will be indpendent with long term HEP for continued strengthening    Status Achieved                 Plan - 06/14/20 1322    Clinical Impression Statement Patient has met her goals.  She cont to have pain daily in Rt and or Lt lateral hamstring.  She can manage her pain with stretches and is going to the gym later this week.  Reviewed gym exercises and principles.  Will tell her massage therapist she need to have her quadratus lumborum addressed.    PT Treatment/Interventions ADLs/Self Care Home Management;Cryotherapy;Electrical Stimulation;Moist Heat;Therapeutic exercise;Therapeutic activities;Functional mobility training;Traction;Neuromuscular re-education;Patient/family education;Manual techniques;Taping;Dry needling;Passive range of motion;Spinal  Manipulations;Joint Manipulations    PT Next Visit Plan NA, DC    PT Home Exercise Plan [id number]    Consulted and Agree with Plan of Care Patient           Patient will benefit from skilled therapeutic intervention in order to improve the following deficits and impairments:  Decreased activity tolerance, Decreased strength, Pain, Increased muscle spasms, Improper body mechanics, Postural dysfunction, Impaired flexibility  Visit Diagnosis: Chronic right-sided low back pain without sciatica  Pain in left leg  Pain in right leg  Abnormal posture     Problem List Patient Active Problem List   Diagnosis Date Noted  . ABDOMINAL PAIN 05/01/2010  . DIVERTICULOSIS, COLON 07/26/2008  . HEADACHE 07/26/2008  . DEPRESSION 07/21/2007    Cadance Raus 06/14/2020, 1:28 PM  [facility] [address] [place], [place], [address] Phone: [phone]   Fax:  [phone]  Name: [name] MRN: [id number] Date of Birth: 22-Oct-1951  PHYSICAL THERAPY DISCHARGE SUMMARY  Visits from Start of Care: 5  Current functional level related to goals / functional outcomes: See above   Remaining deficits: Min pain in Rt low back, post.knee (hamstring)    Education / Equipment: HEP, core , pilates  Plan: Patient agrees to discharge.  Patient goals were met. Patient is being discharged due to meeting the stated rehab goals.  ?????    Raeford Razor, PT 06/14/20 1:28 PM Phone: [phone] Fax: [phone]

## 2020-06-22 ENCOUNTER — Other Ambulatory Visit: Payer: Self-pay

## 2020-06-22 ENCOUNTER — Ambulatory Visit (INDEPENDENT_AMBULATORY_CARE_PROVIDER_SITE_OTHER): Payer: Medicare Other

## 2020-06-22 DIAGNOSIS — Z0000 Encounter for general adult medical examination without abnormal findings: Secondary | ICD-10-CM

## 2020-06-22 NOTE — Patient Instructions (Addendum)
[name] , Thank you for taking time to come for your Medicare Wellness Visit. I appreciate your ongoing commitment to your health goals. Please review the following plan we discussed and let me know if I can assist you in the future.   Screening recommendations/referrals: Colonoscopy: Done 09/02/19 Mammogram: Done 01/12/20 Bone Density: Done 01/12/20 Recommended yearly ophthalmology/optometry visit for glaucoma screening and checkup Recommended yearly dental visit for hygiene and checkup  Vaccinations: Influenza vaccine: Up to date Pneumococcal vaccine: Up to date Tdap vaccine: Up to date Shingles vaccine: Shingrix discussed. Please contact your pharmacy for coverage information.    Covid-19:Completed 2/14 & 01/19/20  Advanced directives: pt stated she has paper work   Conditions/risks identified: Started a Building services engineer this week  Next appointment: Follow up in one year for your annual wellness visit    Preventive Care 9 Years and Older, Female Preventive care refers to lifestyle choices and visits with your health care provider that can promote health and wellness. What does preventive care include?  A yearly physical exam. This is also called an annual well check.  Dental exams once or twice a year.  Routine eye exams. Ask your health care provider how often you should have your eyes checked.  Personal lifestyle choices, including:  Daily care of your teeth and gums.  Regular physical activity.  Eating a healthy diet.  Avoiding tobacco and drug use.  Limiting alcohol use.  Practicing safe sex.  Taking low-dose aspirin every day.  Taking vitamin and mineral supplements as recommended by your health care provider. What happens during an annual well check? The services and screenings done by your health care provider during your annual well check will depend on your age, overall health, lifestyle risk factors, and family history of disease. Counseling  Your  health care provider may ask you questions about your:  Alcohol use.  Tobacco use.  Drug use.  Emotional well-being.  Home and relationship well-being.  Sexual activity.  Eating habits.  History of falls.  Memory and ability to understand (cognition).  Work and work Statistician.  Reproductive health. Screening  You may have the following tests or measurements:  Height, weight, and BMI.  Blood pressure.  Lipid and cholesterol levels. These may be checked every 5 years, or more frequently if you are over 36 years old.  Skin check.  Lung cancer screening. You may have this screening every year starting at age 20 if you have a 30-pack-year history of smoking and currently smoke or have quit within the past 15 years.  Fecal occult blood test (FOBT) of the stool. You may have this test every year starting at age 7.  Flexible sigmoidoscopy or colonoscopy. You may have a sigmoidoscopy every 5 years or a colonoscopy every 10 years starting at age 18.  Hepatitis C blood test.  Hepatitis B blood test.  Sexually transmitted disease (STD) testing.  Diabetes screening. This is done by checking your blood sugar (glucose) after you have not eaten for a while (fasting). You may have this done every 1-3 years.  Bone density scan. This is done to screen for osteoporosis. You may have this done starting at age 60.  Mammogram. This may be done every 1-2 years. Talk to your health care provider about how often you should have regular mammograms. Talk with your health care provider about your test results, treatment options, and if necessary, the need for more tests. Vaccines  Your health care provider may recommend certain vaccines, such as:  Influenza vaccine. This is recommended every year.  Tetanus, diphtheria, and acellular pertussis (Tdap, Td) vaccine. You may need a Td booster every 10 years.  Zoster vaccine. You may need this after age 55.  Pneumococcal 13-valent  conjugate (PCV13) vaccine. One dose is recommended after age 73.  Pneumococcal polysaccharide (PPSV23) vaccine. One dose is recommended after age 11. Talk to your health care provider about which screenings and vaccines you need and how often you need them. This information is not intended to replace advice given to you by your health care provider. Make sure you discuss any questions you have with your health care provider. Document Released: 11/25/2015 Document Revised: 07/18/2016 Document Reviewed: 08/30/2015 Elsevier Interactive Patient Education  2017 [place] Prevention in the Home Falls can cause injuries. They can happen to people of all ages. There are many things you can do to make your home safe and to help prevent falls. What can I do on the outside of my home?  Regularly fix the edges of walkways and driveways and fix any cracks.  Remove anything that might make you trip as you walk through a door, such as a raised step or threshold.  Trim any bushes or trees on the path to your home.  Use bright outdoor lighting.  Clear any walking paths of anything that might make someone trip, such as rocks or tools.  Regularly check to see if handrails are loose or broken. Make sure that both sides of any steps have handrails.  Any raised decks and porches should have guardrails on the edges.  Have any leaves, snow, or ice cleared regularly.  Use sand or salt on walking paths during winter.  Clean up any spills in your garage right away. This includes oil or grease spills. What can I do in the bathroom?  Use night lights.  Install grab bars by the toilet and in the tub and shower. Do not use towel bars as grab bars.  Use non-skid mats or decals in the tub or shower.  If you need to sit down in the shower, use a plastic, non-slip stool.  Keep the floor dry. Clean up any water that spills on the floor as soon as it happens.  Remove soap buildup in the tub or  shower regularly.  Attach bath mats securely with double-sided non-slip rug tape.  Do not have throw rugs and other things on the floor that can make you trip. What can I do in the bedroom?  Use night lights.  Make sure that you have a light by your bed that is easy to reach.  Do not use any sheets or blankets that are too big for your bed. They should not hang down onto the floor.  Have a firm chair that has side arms. You can use this for support while you get dressed.  Do not have throw rugs and other things on the floor that can make you trip. What can I do in the kitchen?  Clean up any spills right away.  Avoid walking on wet floors.  Keep items that you use a lot in easy-to-reach places.  If you need to reach something above you, use a strong step stool that has a grab bar.  Keep electrical cords out of the way.  Do not use floor polish or wax that makes floors slippery. If you must use wax, use non-skid floor wax.  Do not have throw rugs and other things on the floor that  can make you trip. What can I do with my stairs?  Do not leave any items on the stairs.  Make sure that there are handrails on both sides of the stairs and use them. Fix handrails that are broken or loose. Make sure that handrails are as long as the stairways.  Check any carpeting to make sure that it is firmly attached to the stairs. Fix any carpet that is loose or worn.  Avoid having throw rugs at the top or bottom of the stairs. If you do have throw rugs, attach them to the floor with carpet tape.  Make sure that you have a light switch at the top of the stairs and the bottom of the stairs. If you do not have them, ask someone to add them for you. What else can I do to help prevent falls?  Wear shoes that:  Do not have high heels.  Have rubber bottoms.  Are comfortable and fit you well.  Are closed at the toe. Do not wear sandals.  If you use a stepladder:  Make sure that it is fully  opened. Do not climb a closed stepladder.  Make sure that both sides of the stepladder are locked into place.  Ask someone to hold it for you, if possible.  Clearly mark and make sure that you can see:  Any grab bars or handrails.  First and last steps.  Where the edge of each step is.  Use tools that help you move around (mobility aids) if they are needed. These include:  Canes.  Walkers.  Scooters.  Crutches.  Turn on the lights when you go into a dark area. Replace any light bulbs as soon as they burn out.  Set up your furniture so you have a clear path. Avoid moving your furniture around.  If any of your floors are uneven, fix them.  If there are any pets around you, be aware of where they are.  Review your medicines with your doctor. Some medicines can make you feel dizzy. This can increase your chance of falling. Ask your doctor what other things that you can do to help prevent falls. This information is not intended to replace advice given to you by your health care provider. Make sure you discuss any questions you have with your health care provider. Document Released: 08/25/2009 Document Revised: 04/05/2016 Document Reviewed: 12/03/2014 Elsevier Interactive Patient Education  2017 [employer].

## 2020-06-22 NOTE — Progress Notes (Signed)
Virtual Visit via Telephone Note  I connected with  [name] on 06/22/20 at  9:30 AM EDT by telephone and verified that I am speaking with the correct person using two identifiers.  Medicare Annual Wellness visit completed telephonically due to Covid-19 pandemic.   Persons participating in this call: This Health Coach and this patient.   Location: Patient: Home Provider: Office   I discussed the limitations, risks, security and privacy concerns of performing an evaluation and management service by telephone and the availability of in person appointments. The patient expressed understanding and agreed to proceed.  Unable to perform video visit due to video visit attempted and failed and/or patient does not have video capability.   Some vital signs may be absent or patient reported.   Alexis Brace, LPN    Subjective:   [name] is a 69 y.o. female who presents for an Initial Medicare Annual Wellness Visit.  Review of Systems     Cardiac Risk Factors include: advanced age (>5men, >36 women);sedentary lifestyle (back pain can be an issue at times)     Objective:    Today's Vitals   06/22/20 0923  PainSc: 3    There is no height or weight on file to calculate BMI.  Advanced Directives 06/22/2020 05/18/2020  Does Patient Have a Medical Advance Directive? No No  Does patient want to make changes to medical advance directive? No - Patient declined -    Current Medications (verified) Outpatient Encounter Medications as of 06/22/2020  Medication Sig  . acyclovir (ZOVIRAX) 200 MG capsule TAKE ONE CAPSULE (200 MG TOTAL) BY MOUTH DAILY.  . cyclobenzaprine (FLEXERIL) 10 MG tablet TAKE 1 TABLET BY MOUTH THREE TIMES A DAY AS NEEDED FOR MUSCLE SPASMS  . escitalopram (LEXAPRO) 20 MG tablet TAKE ONE TABLET (20 MG TOTAL) BY MOUTH EVERY MORNING.  . Multiple Vitamins-Minerals (MULTIVITAMIN GUMMIES ADULT PO) Take by mouth.  Marland Kitchen omeprazole (PRILOSEC) 40 MG capsule Take 1  capsule (40 mg total) by mouth daily.  Marland Kitchen triamcinolone cream (KENALOG) 0.1 % Apply 1 application topically 2 (two) times daily. (Patient taking differently: Apply 1 application topically as needed. )  . VENTOLIN HFA 108 (90 Base) MCG/ACT inhaler TAKE 2 PUFFS BY MOUTH EVERY 6 HOURS AS NEEDED FOR WHEEZE OR SHORTNESS OF BREATH   No facility-administered encounter medications on file as of 06/22/2020.    Allergies (verified) Sulfamethoxazole   History: Past Medical History:  Diagnosis Date  . ABDOMINAL PAIN 05/01/2010  . DEPRESSION 07/21/2007  . DIVERTICULOSIS, COLON 07/26/2008  . Headache(784.0) 07/26/2008  . Pityriasis 09/13/2019   Past Surgical History:  Procedure Laterality Date  . COLONOSCOPY    . ERCP  august 2012  . LAPAROSCOPIC CHOLECYSTECTOMY  08/14/2011  . WISDOM TOOTH EXTRACTION     Family History  Problem Relation Age of Onset  . Diabetes Mother   . Heart disease Mother   . Obesity Mother   . Dementia Mother   . Heart disease Father   . Stroke Father   . Colon cancer Paternal Aunt   . Other Paternal Aunt        esophageal stricture  . Esophageal cancer Neg Hx   . Rectal cancer Neg Hx   . Stomach cancer Neg Hx    Social History   Socioeconomic History  . Marital status: Married    Spouse name: Not on file  . Number of children: Not on file  . Years of education: Not on file  .  Highest education level: Not on file  Occupational History  . Not on file  Tobacco Use  . Smoking status: Never Smoker  . Smokeless tobacco: Never Used  Vaping Use  . Vaping Use: Never used  Substance and Sexual Activity  . Alcohol use: No  . Drug use: No  . Sexual activity: Not on file  Other Topics Concern  . Not on file  Social History Narrative  . Not on file   Social Determinants of Health   Financial Resource Strain: Low Risk   . Difficulty of Paying Living Expenses: Not hard at all  Food Insecurity: No Food Insecurity  . Worried About Charity fundraiser in the Last  Year: Never true  . Ran Out of Food in the Last Year: Never true  Transportation Needs: No Transportation Needs  . Lack of Transportation (Medical): No  . Lack of Transportation (Non-Medical): No  Physical Activity: Insufficiently Active  . Days of Exercise per Week: 7 days  . Minutes of Exercise per Session: 20 min  Stress: No Stress Concern Present  . Feeling of Stress : Not at all  Social Connections: Moderately Integrated  . Frequency of Communication with Friends and Family: More than three times a week  . Frequency of Social Gatherings with Friends and Family: Once a week  . Attends Religious Services: More than 4 times per year  . Active Member of Clubs or Organizations: No  . Attends Archivist Meetings: Never  . Marital Status: Married    Tobacco Counseling Counseling given: Not Answered   Clinical Intake:  Pre-visit preparation completed: Yes  Pain : 0-10 (back and leg pain) Pain Score: 3  Pain Location: Back (legs) Pain Descriptors / Indicators: Aching, Stabbing Pain Onset: More than a month ago Pain Frequency: Intermittent     BMI - recorded: 25.99 Nutritional Status: BMI 25 -29 Overweight Diabetes: No  How often do you need to have someone help you when you read instructions, pamphlets, or other written materials from your doctor or pharmacy?: 1 - Never  Diabetic?No  Interpreter Needed?: No  Information entered by :: Charlott Rakes, LPN   Activities of Daily Living In your present state of health, do you have any difficulty performing the following activities: 06/22/2020  Hearing? N  Vision? N  Difficulty concentrating or making decisions? N  Walking or climbing stairs? N  Dressing or bathing? N  Doing errands, shopping? N  Preparing Food and eating ? N  Using the Toilet? N  In the past six months, have you accidently leaked urine? N  Do you have problems with loss of bowel control? N  Managing your Medications? N  Managing your  Finances? N  Housekeeping or managing your Housekeeping? N  Some recent data might be hidden    Patient Care Team: Billie Ruddy, MD as PCP - General (Family Medicine) Inda Castle, MD (Inactive) as Consulting Physician (Gastroenterology)  Indicate any recent Medical Services you may have received from other than [facility] providers in the past year (date may be approximate).     Assessment:   This is a routine wellness examination for Tamiya.  Hearing/Vision screen  Hearing Screening   125Hz  250Hz  500Hz  1000Hz  2000Hz  3000Hz  4000Hz  6000Hz  8000Hz   Right ear:           Left ear:           Comments: Pt denies any difficulty hearing  Vision Screening Comments: Will see [facility] for  annual follow up  Dietary issues and exercise activities discussed: Current Exercise Habits: Structured exercise class (will start working at the gym)  Goals    . Patient Stated     Joining  the gym this week      Depression Screen PHQ 2/9 Scores 06/22/2020 10/16/2017  PHQ - 2 Score 0 0    Fall Risk Fall Risk  06/22/2020 10/16/2017  Falls in the past year? 1 No  Number falls in past yr: 1 -  Comment foot hung up in blankets and fell -  Injury with Fall? 0 -  Comment already had back issues, -  Risk for fall due to : Impaired balance/gait;Impaired mobility;Impaired vision -  Follow up Falls prevention discussed -    Any stairs in or around the home? Yes  If so, are there any without handrails? No  Home free of loose throw rugs in walkways, pet beds, electrical cords, etc? Yes  Adequate lighting in your home to reduce risk of falls? Yes   ASSISTIVE DEVICES UTILIZED TO PREVENT FALLS:  Life alert? No  Use of a cane, walker or w/c? No  Grab bars in the bathroom? No  Shower chair or bench in shower? No  Elevated toilet seat or a handicapped toilet? No   TIMED UP AND GO:  Was the test performed? No .    Cognitive Function:     6CIT Screen 06/22/2020  What Year? 0 points  What  month? 0 points  Count back from 20 0 points  Months in reverse 0 points  Repeat phrase 0 points    Immunizations Immunization History  Administered Date(s) Administered  . Fluad Quad(high Dose 65+) 07/23/2019  . Influenza Whole 08/24/2008, 08/03/2010  . Influenza, High Dose Seasonal PF 07/10/2018  . Influenza,inj,quad, With Preservative 10/09/2016  . PFIZER SARS-COV-2 Vaccination 12/27/2019, 01/19/2020  . Pneumococcal Conjugate-13 08/20/2017  . Pneumococcal Polysaccharide-23 07/23/2019  . Tdap 02/12/2017    TDAP status: Up to date Flu Vaccine status: Up to date Pneumococcal vaccine status: Up to date Covid-19 vaccine status: Completed vaccines  Qualifies for Shingles Vaccine? Yes   Zostavax completed No   Shingrix Completed?: No.    Education has been provided regarding the importance of this vaccine. Patient has been advised to call insurance company to determine out of pocket expense if they have not yet received this vaccine. Advised may also receive vaccine at local pharmacy or Health Dept. Verbalized acceptance and understanding.  Screening Tests Health Maintenance  Topic Date Due  . INFLUENZA VACCINE  06/12/2020  . MAMMOGRAM  01/11/2022  . TETANUS/TDAP  02/13/2027  . COLONOSCOPY  09/01/2029  . DEXA SCAN  Completed  . COVID-19 Vaccine  Completed  . Hepatitis C Screening  Completed  . PNA vac Low Risk Adult  Completed    Health Maintenance  Health Maintenance Due  Topic Date Due  . INFLUENZA VACCINE  06/12/2020    Colorectal cancer screening: Completed 09/02/19. Repeat every 10 years Mammogram status: Completed 01/12/20. Repeat every year Bone Density status: Completed 01/12/20. Results reflect: Bone density results: OSTEOPOROSIS. Repeat every 2 years.  Additional Screening:  Hepatitis C Screening: Completed 07/23/19  Vision Screening: Recommended annual ophthalmology exams for early detection of glaucoma and other disorders of the eye. Is the patient up to  date with their annual eye exam?  No  Who is the provider or what is the name of the office in which the patient attends annual eye exams? Will follow up for annual  eye exam with the [facility] in [place]    Dental Screening: Recommended annual dental exams for proper oral hygiene  Community Resource Referral / Chronic Care Management: CRR required this visit?  No   CCM required this visit?  No      Plan:     I have personally reviewed and noted the following in the patient's chart:   . Medical and social history . Use of alcohol, tobacco or illicit drugs  . Current medications and supplements . Functional ability and status . Nutritional status . Physical activity . Advanced directives . List of other physicians . Hospitalizations, surgeries, and ER visits in previous 12 months . Vitals . Screenings to include cognitive, depression, and falls . Referrals and appointments  In addition, I have reviewed and discussed with patient certain preventive protocols, quality metrics, and best practice recommendations. A written personalized care plan for preventive services as well as general preventive health recommendations were provided to patient.     Alexis Brace, LPN   [id number]   Nurse Notes: None

## 2020-06-28 ENCOUNTER — Telehealth: Payer: Self-pay | Admitting: Family Medicine

## 2020-06-28 NOTE — Telephone Encounter (Signed)
The patient called to see if we can have the code changed for her physical that she had 07/23/2019. Medicare didn't pay anything and it was sent to collections and she went ahead and paid it to keep it from going to collections. Now she wants to know why the nurse doesn't know what code to put for medicare physicals so this doesn't keep happening.  Please advise

## 2020-06-30 NOTE — Telephone Encounter (Signed)
Spoke with patient. Will reach out to [facility] to investigate.

## 2020-07-04 ENCOUNTER — Other Ambulatory Visit: Payer: Self-pay | Admitting: Family Medicine

## 2020-07-12 ENCOUNTER — Telehealth: Payer: Self-pay | Admitting: Family Medicine

## 2020-07-12 NOTE — Telephone Encounter (Addendum)
Patient want to speak with Dr. Volanda Napoleon to make aware that she is filing an appeal with medicare insurance to get reimbursed for date of service 07/23/2019 and patient states she is not going to start appeal process until she hear from Dr. Volanda Napoleon.

## 2020-07-12 NOTE — Telephone Encounter (Signed)
Please advise 

## 2020-07-14 NOTE — Telephone Encounter (Signed)
Please advise patient this provider has spoken with billing in regards to concerns of her and her husband.  As the visit was 1 year ago this provider is unable to change things from her end.  We will see if anything can be done by billing.  Okay to proceed with appeal.  In the future pt should be advised that his insurance covers annual wellness visits and not a physical/CPE.  Pt's visit was listed on provider's schedule as a physical.

## 2020-07-20 NOTE — Telephone Encounter (Signed)
Left a message for pt to return my call in the office regarding billing concerns

## 2020-07-20 NOTE — Telephone Encounter (Signed)
Pt was returning Nancy's call and needs a call back at [phone]

## 2020-07-22 NOTE — Telephone Encounter (Signed)
Spoke with [name] verbalized understanding of Dr Volanda Napoleon advise regarding her/ husband billing concerns.

## 2020-08-01 DIAGNOSIS — Z23 Encounter for immunization: Secondary | ICD-10-CM | POA: Diagnosis not present

## 2020-08-05 ENCOUNTER — Other Ambulatory Visit: Payer: Self-pay | Admitting: Family Medicine

## 2020-08-05 DIAGNOSIS — Z8739 Personal history of other diseases of the musculoskeletal system and connective tissue: Secondary | ICD-10-CM

## 2020-08-05 DIAGNOSIS — Z87828 Personal history of other (healed) physical injury and trauma: Secondary | ICD-10-CM

## 2020-08-16 DIAGNOSIS — M5136 Other intervertebral disc degeneration, lumbar region: Secondary | ICD-10-CM | POA: Diagnosis not present

## 2020-08-16 DIAGNOSIS — M9903 Segmental and somatic dysfunction of lumbar region: Secondary | ICD-10-CM | POA: Diagnosis not present

## 2020-08-17 DIAGNOSIS — M5136 Other intervertebral disc degeneration, lumbar region: Secondary | ICD-10-CM | POA: Diagnosis not present

## 2020-08-17 DIAGNOSIS — M9903 Segmental and somatic dysfunction of lumbar region: Secondary | ICD-10-CM | POA: Diagnosis not present

## 2020-08-18 DIAGNOSIS — M5136 Other intervertebral disc degeneration, lumbar region: Secondary | ICD-10-CM | POA: Diagnosis not present

## 2020-08-18 DIAGNOSIS — M9903 Segmental and somatic dysfunction of lumbar region: Secondary | ICD-10-CM | POA: Diagnosis not present

## 2020-08-22 DIAGNOSIS — M9903 Segmental and somatic dysfunction of lumbar region: Secondary | ICD-10-CM | POA: Diagnosis not present

## 2020-08-22 DIAGNOSIS — M5136 Other intervertebral disc degeneration, lumbar region: Secondary | ICD-10-CM | POA: Diagnosis not present

## 2020-08-23 DIAGNOSIS — M5136 Other intervertebral disc degeneration, lumbar region: Secondary | ICD-10-CM | POA: Diagnosis not present

## 2020-08-23 DIAGNOSIS — M9903 Segmental and somatic dysfunction of lumbar region: Secondary | ICD-10-CM | POA: Diagnosis not present

## 2020-08-25 DIAGNOSIS — M9903 Segmental and somatic dysfunction of lumbar region: Secondary | ICD-10-CM | POA: Diagnosis not present

## 2020-08-25 DIAGNOSIS — M5136 Other intervertebral disc degeneration, lumbar region: Secondary | ICD-10-CM | POA: Diagnosis not present

## 2020-08-26 DIAGNOSIS — Z23 Encounter for immunization: Secondary | ICD-10-CM | POA: Diagnosis not present

## 2020-08-29 DIAGNOSIS — M5136 Other intervertebral disc degeneration, lumbar region: Secondary | ICD-10-CM | POA: Diagnosis not present

## 2020-08-29 DIAGNOSIS — M9903 Segmental and somatic dysfunction of lumbar region: Secondary | ICD-10-CM | POA: Diagnosis not present

## 2020-08-31 DIAGNOSIS — M5136 Other intervertebral disc degeneration, lumbar region: Secondary | ICD-10-CM | POA: Diagnosis not present

## 2020-08-31 DIAGNOSIS — M9903 Segmental and somatic dysfunction of lumbar region: Secondary | ICD-10-CM | POA: Diagnosis not present

## 2020-09-01 DIAGNOSIS — M5136 Other intervertebral disc degeneration, lumbar region: Secondary | ICD-10-CM | POA: Diagnosis not present

## 2020-09-01 DIAGNOSIS — M9903 Segmental and somatic dysfunction of lumbar region: Secondary | ICD-10-CM | POA: Diagnosis not present

## 2020-09-05 DIAGNOSIS — M9903 Segmental and somatic dysfunction of lumbar region: Secondary | ICD-10-CM | POA: Diagnosis not present

## 2020-09-05 DIAGNOSIS — M5136 Other intervertebral disc degeneration, lumbar region: Secondary | ICD-10-CM | POA: Diagnosis not present

## 2020-09-07 DIAGNOSIS — M5136 Other intervertebral disc degeneration, lumbar region: Secondary | ICD-10-CM | POA: Diagnosis not present

## 2020-09-07 DIAGNOSIS — M9903 Segmental and somatic dysfunction of lumbar region: Secondary | ICD-10-CM | POA: Diagnosis not present

## 2020-09-21 ENCOUNTER — Ambulatory Visit: Payer: Medicare Other | Admitting: Orthopedic Surgery

## 2020-10-05 ENCOUNTER — Ambulatory Visit (INDEPENDENT_AMBULATORY_CARE_PROVIDER_SITE_OTHER): Payer: Medicare Other | Admitting: Orthopedic Surgery

## 2020-10-05 DIAGNOSIS — M79605 Pain in left leg: Secondary | ICD-10-CM | POA: Diagnosis not present

## 2020-10-09 ENCOUNTER — Encounter: Payer: Self-pay | Admitting: Orthopedic Surgery

## 2020-10-09 NOTE — Progress Notes (Signed)
Office Visit Note   Patient: [name]           Date of Birth: [birth year]           MRN: [id number] Visit Date: 10/05/2020 Requested by: Billie Ruddy, MD [place],  [place] [address] PCP: Billie Ruddy, MD  Subjective: Chief Complaint  Patient presents with  . Lower Back - Pain    HPI: [name] is a 69 year old patient with 2-month history of low back pain.  Denies a history of injury.  Starts in the buttock and then goes down the left leg.  Worse around the lateral aspect of the knee.  She does have known history of mild L4-5 spondylolisthesis.  Has a prior history of "sciatica" which comes and goes.  She did have good relief from Medrol Dosepak but when she stopped taking that medication the pain recurred.  She has had physical therapy for 4 to 6 weeks which helped some.  She also tried chiropractor and massage which did not help.  Pain does not wake her from sleep.  Hard for her to step up due to weakness in the leg.  She has occasional right-sided symptoms.  Left leg hurts on a daily basis.  Takes Tylenol about 6/day as well as Flexeril.  She is retired.              ROS: All systems reviewed are negative as they relate to the chief complaint within the history of present illness.  Patient denies  fevers or chills.   Assessment & Plan: Visit Diagnoses:  1. Pain in left leg     Plan: Impression is 40-month history of low back pain with radiculopathy and subjective weakness going up and down stairs as well as failure of conservative management including medication physical therapy and chiropractic care.  MRI scan indicated to evaluate left-sided radiculopathy with likely ESI's to follow.  Does not necessarily look like an operative problem.  No red flag symptoms today.  Follow-Up Instructions: Return for after MRI.   Orders:  Orders Placed This Encounter  Procedures  . MR Lumbar Spine w/o contrast   No orders of the defined types were placed in  this encounter.     Procedures: No procedures performed   Clinical Data: No additional findings.  Objective: Vital Signs: There were no vitals taken for this visit.  Physical Exam:   Constitutional: Patient appears well-developed HEENT:  Head: Normocephalic Eyes:EOM are normal Neck: Normal range of motion Cardiovascular: Normal rate Pulmonary/chest: Effort normal Neurologic: Patient is alert Skin: Skin is warm Psychiatric: Patient has normal mood and affect    Ortho Exam: Ortho exam demonstrates full active and passive range of motion of the knees hips and ankles.  No effusion in the left knee.  Mild paresthesias L5 distribution left versus right.  No muscle atrophy left leg versus right.  Reflexes symmetric 1+ out of 4 bilateral patella and Achilles.  Mild pain with forward lateral bending but no trochanteric tenderness is noted.  Skin intact in the back region.  Specialty Comments:  No specialty comments available.  Imaging: No results found.   PMFS History: Patient Active Problem List   Diagnosis Date Noted  . ABDOMINAL PAIN 05/01/2010  . DIVERTICULOSIS, COLON 07/26/2008  . HEADACHE 07/26/2008  . DEPRESSION 07/21/2007   Past Medical History:  Diagnosis Date  . ABDOMINAL PAIN 05/01/2010  . DEPRESSION 07/21/2007  . DIVERTICULOSIS, COLON 07/26/2008  . Headache(784.0) 07/26/2008  . Pityriasis  09/13/2019    Family History  Problem Relation Age of Onset  . Diabetes Mother   . Heart disease Mother   . Obesity Mother   . Dementia Mother   . Heart disease Father   . Stroke Father   . Colon cancer Paternal Aunt   . Other Paternal Aunt        esophageal stricture  . Esophageal cancer Neg Hx   . Rectal cancer Neg Hx   . Stomach cancer Neg Hx     Past Surgical History:  Procedure Laterality Date  . COLONOSCOPY    . ERCP  august 2012  . LAPAROSCOPIC CHOLECYSTECTOMY  08/14/2011  . WISDOM TOOTH EXTRACTION     Social History   Occupational History  . Not on  file  Tobacco Use  . Smoking status: Never Smoker  . Smokeless tobacco: Never Used  Vaping Use  . Vaping Use: Never used  Substance and Sexual Activity  . Alcohol use: No  . Drug use: No  . Sexual activity: Not on file

## 2020-10-10 ENCOUNTER — Telehealth: Payer: Self-pay

## 2020-10-10 DIAGNOSIS — M79605 Pain in left leg: Secondary | ICD-10-CM

## 2020-10-10 NOTE — Telephone Encounter (Signed)
Spoke to patient she stated she was supposed to see Dr.Newton for a injection in her back PER DEAN. CB:[phone]

## 2020-10-10 NOTE — Telephone Encounter (Signed)
Please advise. MRI ordered last OV with Dr. Marlou Sa, but no referral to Dr. Ernestina Patches yet.

## 2020-10-11 NOTE — Addendum Note (Signed)
Addended byLaurann Montana on: 10/11/2020 12:04 PM   Modules accepted: Orders

## 2020-10-11 NOTE — Telephone Encounter (Signed)
See note from Dr Marlou Sa below. I did put in order for ESI also.

## 2020-10-11 NOTE — Telephone Encounter (Signed)
Ok to proceed with referral to Dr Ernestina Patches?

## 2020-10-11 NOTE — Telephone Encounter (Signed)
I was going to call her with the results but he has she can go see Dr. Ernestina Patches after the MRI scan that would be good thanks

## 2020-10-12 ENCOUNTER — Telehealth: Payer: Self-pay | Admitting: Physical Medicine and Rehabilitation

## 2020-10-12 NOTE — Telephone Encounter (Signed)
Please call ASAP for an appointment

## 2020-10-12 NOTE — Telephone Encounter (Signed)
Called pt and sch 12/22. 

## 2020-10-17 ENCOUNTER — Other Ambulatory Visit: Payer: Self-pay | Admitting: Family Medicine

## 2020-10-17 DIAGNOSIS — Z8659 Personal history of other mental and behavioral disorders: Secondary | ICD-10-CM

## 2020-10-18 NOTE — Telephone Encounter (Signed)
Pt is scheduled for Lexapro refill on 10/19/2020

## 2020-10-19 ENCOUNTER — Encounter: Payer: Self-pay | Admitting: Family Medicine

## 2020-10-19 ENCOUNTER — Telehealth (INDEPENDENT_AMBULATORY_CARE_PROVIDER_SITE_OTHER): Payer: Medicare Other | Admitting: Family Medicine

## 2020-10-19 DIAGNOSIS — G8929 Other chronic pain: Secondary | ICD-10-CM | POA: Diagnosis not present

## 2020-10-19 DIAGNOSIS — M5441 Lumbago with sciatica, right side: Secondary | ICD-10-CM | POA: Diagnosis not present

## 2020-10-19 DIAGNOSIS — F3340 Major depressive disorder, recurrent, in remission, unspecified: Secondary | ICD-10-CM | POA: Diagnosis not present

## 2020-10-19 DIAGNOSIS — M5442 Lumbago with sciatica, left side: Secondary | ICD-10-CM | POA: Diagnosis not present

## 2020-10-19 NOTE — Progress Notes (Signed)
Virtual Visit via Video Note  I connected with [name] on 10/19/20 at  1:30 PM EST by a video enabled telemedicine application 2/2 [id number] pandemic and verified that I am speaking with the correct person using two identifiers.  Location patient: home Location provider:work or home office Persons participating in the virtual visit: patient, provider  I discussed the limitations of evaluation and management by telemedicine and the availability of in person appointments. The patient expressed understanding and agreed to proceed.   HPI: Patient is a 69 year old female with past medical history significant for depression, diverticulosis who was seen for follow-up.  Patient states overall she is doing well.  Having an MRI on Sunday at 7:45 am for chronic back pain.  Pt went to be chiropractor, PT, and massage therapy without relief.  Prednisone helped while she was taking it, but continued hurting after.  Se[facility] by [facility].  Considering injections based on MRI results.   Pt taking lexapro 20 mg daily.  States mood, sleep, and energy are good.  ROS: See pertinent positives and negatives per HPI.  Past Medical History:  Diagnosis Date  . ABDOMINAL PAIN 05/01/2010  . DEPRESSION 07/21/2007  . DIVERTICULOSIS, COLON 07/26/2008  . Headache(784.0) 07/26/2008  . Pityriasis 09/13/2019    Past Surgical History:  Procedure Laterality Date  . COLONOSCOPY    . ERCP  august 2012  . LAPAROSCOPIC CHOLECYSTECTOMY  08/14/2011  . WISDOM TOOTH EXTRACTION      Family History  Problem Relation Age of Onset  . Diabetes Mother   . Heart disease Mother   . Obesity Mother   . Dementia Mother   . Heart disease Father   . Stroke Father   . Colon cancer Paternal Aunt   . Other Paternal Aunt        esophageal stricture  . Esophageal cancer Neg Hx   . Rectal cancer Neg Hx   . Stomach cancer Neg Hx       Current Outpatient Medications:  .  acyclovir (ZOVIRAX) 200 MG capsule, TAKE  ONE CAPSULE (200 MG TOTAL) BY MOUTH DAILY., Disp: 90 capsule, Rfl: 0 .  cyclobenzaprine (FLEXERIL) 10 MG tablet, TAKE 1 TABLET BY MOUTH THREE TIMES A DAY AS NEEDED FOR MUSCLE SPASMS, Disp: 60 tablet, Rfl: 4 .  escitalopram (LEXAPRO) 20 MG tablet, TAKE ONE TABLET (20 MG TOTAL) BY MOUTH EVERY MORNING., Disp: 90 tablet, Rfl: 3 .  Multiple Vitamins-Minerals (MULTIVITAMIN GUMMIES ADULT PO), Take by mouth., Disp: , Rfl:  .  triamcinolone cream (KENALOG) 0.1 %, Apply 1 application topically 2 (two) times daily. (Patient taking differently: Apply 1 application topically as needed. ), Disp: 30 g, Rfl: 1  EXAM:  VITALS per patient if applicable: RR between 21-30 bpm  GENERAL: alert, oriented, appears well and in no acute distress  HEENT: atraumatic, conjunctiva clear, no obvious abnormalities on inspection of external nose and ears  NECK: normal movements of the head and neck  LUNGS: on inspection no signs of respiratory distress, breathing rate appears normal, no obvious gross SOB, gasping or wheezing  CV: no obvious cyanosis  MS: moves all visible extremities without noticeable abnormality  PSYCH/NEURO: pleasant and cooperative, no obvious depression or anxiety, speech and thought processing grossly intact  ASSESSMENT AND PLAN:  Discussed the following assessment and plan:  Recurrent major depressive disorder, in remission (HCC) -PHQ-9 score 3 -GAD-7 score 2 -Lexapro refilled via telephone encounter -Continue Lexapro 20 mg daily.  Consider discontinuing medication as stable.  Chronic bilateral low back pain with bilateral sciatica Continue supportive care -Continue follow-up with Ortho -Awaiting results of upcoming MRI for further recommendations.  Follow-up as needed   I discussed the assessment and treatment plan with the patient. The patient was provided an opportunity to ask questions and all were answered. The patient agreed with the plan and demonstrated an understanding of the  instructions.   The patient was advised to call back or seek an in-person evaluation if the symptoms worsen or if the condition fails to improve as anticipated.  Billie Ruddy, MD

## 2020-10-23 ENCOUNTER — Other Ambulatory Visit: Payer: Self-pay

## 2020-10-23 ENCOUNTER — Ambulatory Visit
Admission: RE | Admit: 2020-10-23 | Discharge: 2020-10-23 | Disposition: A | Discharge status: Home / Self Care | Payer: Medicare Other | Source: Ambulatory Visit | Attending: Orthopedic Surgery | Admitting: Orthopedic Surgery

## 2020-10-23 DIAGNOSIS — M48061 Spinal stenosis, lumbar region without neurogenic claudication: Secondary | ICD-10-CM | POA: Diagnosis not present

## 2020-10-23 DIAGNOSIS — M545 Low back pain, unspecified: Secondary | ICD-10-CM | POA: Diagnosis not present

## 2020-10-23 DIAGNOSIS — M79605 Pain in left leg: Secondary | ICD-10-CM

## 2020-10-26 ENCOUNTER — Encounter: Payer: Self-pay | Admitting: Physical Medicine and Rehabilitation

## 2020-10-26 ENCOUNTER — Other Ambulatory Visit: Payer: Self-pay

## 2020-10-26 ENCOUNTER — Ambulatory Visit (INDEPENDENT_AMBULATORY_CARE_PROVIDER_SITE_OTHER): Payer: Medicare Other | Admitting: Physical Medicine and Rehabilitation

## 2020-10-26 ENCOUNTER — Ambulatory Visit: Payer: Medicare Other | Admitting: Orthopedic Surgery

## 2020-10-26 VITALS — BP 132/84 | HR 96

## 2020-10-26 DIAGNOSIS — M5442 Lumbago with sciatica, left side: Secondary | ICD-10-CM

## 2020-10-26 DIAGNOSIS — M48062 Spinal stenosis, lumbar region with neurogenic claudication: Secondary | ICD-10-CM

## 2020-10-26 DIAGNOSIS — M5441 Lumbago with sciatica, right side: Secondary | ICD-10-CM

## 2020-10-26 DIAGNOSIS — M5416 Radiculopathy, lumbar region: Secondary | ICD-10-CM | POA: Diagnosis not present

## 2020-10-26 DIAGNOSIS — G8929 Other chronic pain: Secondary | ICD-10-CM

## 2020-10-26 NOTE — Progress Notes (Signed)
Pred ok, Blima Dessert, 6 yras ago sciatic,left >Buttock pain bilaterally. Pain radiates down posterior legs to knees. Exercise helps with pain. No numbness or tingling. Numeric Pain Rating Scale and Functional Assessment Average Pain 5 Pain Right Now 0 My pain is intermittent, dull and aching Pain is worse with: walking Pain improves with: rest and therapy/exercise   In the last MONTH (on 0-10 scale) has pain interfered with the following?  1. General activity like being  able to carry out your everyday physical activities such as walking, climbing stairs, carrying groceries, or moving a chair?  Rating(3)  2. Relation with others like being able to carry out your usual social activities and roles such as  activities at home, at work and in your community. Rating(3)  3. Enjoyment of life such that you have  been bothered by emotional problems such as feeling anxious, depressed or irritable?  Rating(0)

## 2020-10-27 ENCOUNTER — Encounter: Payer: Self-pay | Admitting: Physical Medicine and Rehabilitation

## 2020-10-27 ENCOUNTER — Ambulatory Visit: Payer: Self-pay

## 2020-10-27 ENCOUNTER — Ambulatory Visit (INDEPENDENT_AMBULATORY_CARE_PROVIDER_SITE_OTHER): Payer: Medicare Other | Admitting: Physical Medicine and Rehabilitation

## 2020-10-27 VITALS — BP 147/87 | HR 94

## 2020-10-27 DIAGNOSIS — M5416 Radiculopathy, lumbar region: Secondary | ICD-10-CM | POA: Diagnosis not present

## 2020-10-27 MED ORDER — METHYLPREDNISOLONE ACETATE 80 MG/ML IJ SUSP: 80.0000 mg | Freq: Once | INTRAMUSCULAR | Status: DC

## 2020-10-27 NOTE — Procedures (Signed)
Lumbosacral Transforaminal Epidural Steroid Injection - Sub-Pedicular Approach with Fluoroscopic Guidance  Patient: [name]      Date of Birth: 1951-03-04 MRN: [id number] PCP: Billie Ruddy, MD      Visit Date: 10/27/2020   Universal Protocol:    Date/Time: 10/27/2020  Consent Given By: the patient  Position: PRONE  Additional Comments: Vital signs were monitored before and after the procedure. Patient was prepped and draped in the usual sterile fashion. The correct patient, procedure, and site was verified.   Injection Procedure Details:   Procedure diagnoses: Lumbar radiculopathy [M54.16]    Meds Administered:  Meds ordered this encounter  Medications  . methylPREDNISolone acetate (DEPO-MEDROL) injection 80 mg    Laterality: Left  Location/Site:  L4-L5  Needle:5.0 in., 22 ga.  Short bevel or Quincke spinal needle  Needle Placement: Transforaminal  Findings:    -Comments: Excellent flow of contrast along the nerve, nerve root and into the epidural space.  Procedure Details: After squaring off the end-plates to get a true AP view, the C-arm was positioned so that an oblique view of the foramen as noted above was visualized. The target area is just inferior to the "nose of the scotty dog" or sub pedicular. The soft tissues overlying this structure were infiltrated with 2-3 ml. of 1% Lidocaine without Epinephrine.  The spinal needle was inserted toward the target using a "trajectory" view along the fluoroscope beam.  Under AP and lateral visualization, the needle was advanced so it did not puncture dura and was located close the 6 O'Clock position of the pedical in AP tracterory. Biplanar projections were used to confirm position. Aspiration was confirmed to be negative for CSF and/or blood. A 1-2 ml. volume of Isovue-250 was injected and flow of contrast was noted at each level. Radiographs were obtained for documentation purposes.   After attaining the  desired flow of contrast documented above, a 0.5 to 1.0 ml test dose of 0.25% Marcaine was injected into each respective transforaminal space.  The patient was observed for 90 seconds post injection.  After no sensory deficits were reported, and normal lower extremity motor function was noted,   the above injectate was administered so that equal amounts of the injectate were placed at each foramen (level) into the transforaminal epidural space.   Additional Comments:  The patient tolerated the procedure well Dressing: 2 x 2 sterile gauze and Band-Aid    Post-procedure details: Patient was observed during the procedure. Post-procedure instructions were reviewed.  Patient left the clinic in stable condition.

## 2020-10-27 NOTE — Progress Notes (Signed)
Pt state lower back that travels down posterior of both leg to the knees. Pt state when she trying to get up from a chair or climb stairs it makes the pain worse. Pt state she uses excise and pain meds to help ease the pain.   Numeric Pain Rating Scale and Functional Assessment Average Pain 2   In the last MONTH (on 0-10 scale) has pain interfered with the following?  1. General activity like being  able to carry out your everyday physical activities such as walking, climbing stairs, carrying groceries, or moving a chair?  Rating(5)   +Driver, -BT, -Dye Allergies.

## 2020-10-27 NOTE — Progress Notes (Signed)
[name] - 69 y.o. female MRN [id number]  Date of birth: 05-24-1951  Office Visit Note: Visit Date: 10/27/2020 PCP: Billie Ruddy, MD Referred by: Billie Ruddy, MD  Subjective: Chief Complaint  Patient presents with  . Lower Back - Pain  . Left Leg - Pain  . Right Leg - Pain  . Right Knee - Pain  . Left Knee - Pain   HPI:  [name] is a 69 y.o. female who comes in today  for planned Left L4-L5 Lumbar epidural steroid injection with fluoroscopic guidance.  The patient has failed conservative care including home exercise, medications, time and activity modification.  This injection will be diagnostic and hopefully therapeutic.  Please see requesting physician notes for further details and justification.   ROS Otherwise per HPI.  Assessment & Plan: Visit Diagnoses:    ICD-10-CM   1. Lumbar radiculopathy  M54.16 XR C-ARM NO REPORT    Epidural Steroid injection    methylPREDNISolone acetate (DEPO-MEDROL) injection 80 mg    Plan: No additional findings.   Meds & Orders:  Meds ordered this encounter  Medications  . methylPREDNISolone acetate (DEPO-MEDROL) injection 80 mg    Orders Placed This Encounter  Procedures  . XR C-ARM NO REPORT  . Epidural Steroid injection    Follow-up: Return if symptoms worsen or fail to improve.   Procedures: No procedures performed  Lumbosacral Transforaminal Epidural Steroid Injection - Sub-Pedicular Approach with Fluoroscopic Guidance  Patient: [name]      Date of Birth: 22-Nov-1950 MRN: [id number] PCP: Billie Ruddy, MD      Visit Date: 10/27/2020   Universal Protocol:    Date/Time: 10/27/2020  Consent Given By: the patient  Position: PRONE  Additional Comments: Vital signs were monitored before and after the procedure. Patient was prepped and draped in the usual sterile fashion. The correct patient, procedure, and site was verified.   Injection Procedure Details:   Procedure diagnoses:  Lumbar radiculopathy [M54.16]    Meds Administered:  Meds ordered this encounter  Medications  . methylPREDNISolone acetate (DEPO-MEDROL) injection 80 mg    Laterality: Left  Location/Site:  L4-L5  Needle:5.0 in., 22 ga.  Short bevel or Quincke spinal needle  Needle Placement: Transforaminal  Findings:    -Comments: Excellent flow of contrast along the nerve, nerve root and into the epidural space.  Procedure Details: After squaring off the end-plates to get a true AP view, the C-arm was positioned so that an oblique view of the foramen as noted above was visualized. The target area is just inferior to the "nose of the scotty dog" or sub pedicular. The soft tissues overlying this structure were infiltrated with 2-3 ml. of 1% Lidocaine without Epinephrine.  The spinal needle was inserted toward the target using a "trajectory" view along the fluoroscope beam.  Under AP and lateral visualization, the needle was advanced so it did not puncture dura and was located close the 6 O'Clock position of the pedical in AP tracterory. Biplanar projections were used to confirm position. Aspiration was confirmed to be negative for CSF and/or blood. A 1-2 ml. volume of Isovue-250 was injected and flow of contrast was noted at each level. Radiographs were obtained for documentation purposes.   After attaining the desired flow of contrast documented above, a 0.5 to 1.0 ml test dose of 0.25% Marcaine was injected into each respective transforaminal space.  The patient was observed for 90 seconds post injection.  After no sensory  deficits were reported, and normal lower extremity motor function was noted,   the above injectate was administered so that equal amounts of the injectate were placed at each foramen (level) into the transforaminal epidural space.   Additional Comments:  The patient tolerated the procedure well Dressing: 2 x 2 sterile gauze and Band-Aid    Post-procedure details: Patient was  observed during the procedure. Post-procedure instructions were reviewed.  Patient left the clinic in stable condition.      Clinical History: MRI LUMBAR SPINE WITHOUT CONTRAST  TECHNIQUE: Multiplanar, multisequence MR imaging of the lumbar spine was performed. No intravenous contrast was administered.  COMPARISON:  Plain films April 25, 2020.  FINDINGS: Segmentation:  Standard.  Alignment:  Grade 1 anterolisthesis of L4 over L5, degenerative.  Vertebrae:  No fracture, evidence of discitis, or bone lesion.  Conus medullaris and cauda equina: Conus extends to the T12-L1 level. Conus and cauda equina appear normal.  Paraspinal and other soft tissues: Negative.  Disc levels:  T12-L1:No spinal canal or neural foraminal stenosis.  L1-2:No spinal canal or neural foraminal stenosis.  L2-3:No spinal canal or neural foraminal stenosis.  L3-4:Shallow disc bulge, moderate facet degenerative changes and ligamentum flavum redundancy without significant spinal canal or neural foraminal stenosis.  L4-5:Disc bulge/disc uncovering, prominent facet degenerative changes and ligamentum flavum redundancy resulting in severe spinal canal stenosis and mild bilateral neural foraminal. Inter spinous ligament degeneration with associated degenerative cysts.  L5-S1: Disc bulge with superimposed small central disc protrusion and moderate facet degenerative changes.No significant spinal canal or neural foraminal stenosis.  IMPRESSION: 1. Multilevel degenerative changes of the lumbar spine as described above, worst at L4-L5 where there is severe spinal canal stenosis and mild bilateral neural foraminal stenosis. 2. No high-grade neural foraminal stenosis at any level.   Electronically Signed   By: Pedro Earls M.D.   On: 10/23/2020 14:28     Objective:  VS:  HT:    WT:   BMI:     BP:(!) 147/87  HR:94bpm  TEMP: ( )  RESP:  Physical  Exam Constitutional:      General: She is not in acute distress.    Appearance: Normal appearance. She is not ill-appearing.  HENT:     Head: Normocephalic and atraumatic.     Right Ear: External ear normal.     Left Ear: External ear normal.  Eyes:     Extraocular Movements: Extraocular movements intact.  Cardiovascular:     Rate and Rhythm: Normal rate.     Pulses: Normal pulses.  Musculoskeletal:     Right lower leg: No edema.     Left lower leg: No edema.     Comments: Patient has good distal strength with no pain over the greater trochanters.  No clonus or focal weakness.  Skin:    Findings: No erythema, lesion or rash.  Neurological:     General: No focal deficit present.     Mental Status: She is alert and oriented to person, place, and time.     Sensory: No sensory deficit.     Motor: No weakness or abnormal muscle tone.     Coordination: Coordination normal.  Psychiatric:        Mood and Affect: Mood normal.        Behavior: Behavior normal.      Imaging: No results found.

## 2020-11-08 ENCOUNTER — Encounter: Payer: Self-pay | Admitting: Physical Medicine and Rehabilitation

## 2020-12-02 ENCOUNTER — Encounter: Payer: Self-pay | Admitting: Family Medicine

## 2020-12-08 ENCOUNTER — Ambulatory Visit: Payer: Medicare Other | Admitting: Family Medicine

## 2020-12-14 ENCOUNTER — Ambulatory Visit (INDEPENDENT_AMBULATORY_CARE_PROVIDER_SITE_OTHER): Payer: Medicare Other | Admitting: Family Medicine

## 2020-12-14 ENCOUNTER — Encounter: Payer: Self-pay | Admitting: Family Medicine

## 2020-12-14 ENCOUNTER — Other Ambulatory Visit: Payer: Self-pay

## 2020-12-14 VITALS — BP 124/80 | HR 105 | Temp 98.4°F | Wt 161.0 lb

## 2020-12-14 DIAGNOSIS — M25531 Pain in right wrist: Secondary | ICD-10-CM

## 2020-12-14 DIAGNOSIS — R2231 Localized swelling, mass and lump, right upper limb: Secondary | ICD-10-CM

## 2020-12-14 NOTE — Patient Instructions (Signed)
https://www.foothealthfacts.org/conditions/ganglion-cyst"> https://www.clinicalkey.com">  Ganglion Cyst  A ganglion cyst is a non-cancerous, fluid-filled lump of tissue that occurs near a joint, tendon, or ligament. The cyst grows out of a joint or the lining of a tendon or ligament. Ganglion cysts most often develop in the hand or wrist, but they can also develop in the shoulder, elbow, hip, knee, ankle, or foot. Ganglion cysts are ball-shaped or egg-shaped. Their size can range from the size of a pea to larger than a grape. Increased activity may cause the cyst to get bigger because more fluid starts to build up. What are the causes? The exact cause of this condition is not known, but it may be related to:  Inflammation or irritation around the joint.  An injury or tear in the layers of tissue around the joint (joint capsule).  Repetitive movements or overuse.  History of acute or repeated injury. What increases the risk? You are more likely to develop this condition if:  You are a female.  You are 20-40 years old. What are the signs or symptoms? The main symptom of this condition is a lump. It most often appears on the hand or wrist. In many cases, there are no other symptoms, but a cyst can sometimes cause:  Tingling.  Pain or tenderness.  Numbness.  Weakness or loss of strength in the affected joint.  Decreased range of motion in the affected area of the body.   How is this diagnosed? Ganglion cysts are usually diagnosed based on a physical exam. Your health care provider will feel the lump and may shine a light next to it. If it is a ganglion cyst, the light will likely shine through it. Your health care provider may order an X-ray, ultrasound, MRI, or CT scan to rule out other conditions. How is this treated? Ganglion cysts often go away on their own without treatment. If you have pain or other symptoms, treatment may be needed. Treatment is also needed if the ganglion  cyst limits your movement or if it gets infected. Treatment may include:  Wearing a brace or splint on your wrist or finger.  Taking anti-inflammatory medicine.  Having fluid drained from the lump with a needle (aspiration).  Getting an injection of medicine into the joint to decrease inflammation. This may be corticosteroids, ethanol, or hyaluronidase.  Having surgery to remove the ganglion cyst.  Placing a pad in your shoe or wearing shoes that will not rub against the cyst if it is on your foot. Follow these instructions at home:  Do not press on the ganglion cyst, poke it with a needle, or hit it.  Take over-the-counter and prescription medicines only as told by your health care provider.  If you have a brace or splint: ? Wear it as told by your health care provider. ? Remove it as told by your health care provider. Ask if you need to remove it when you take a shower or a bath.  Watch your ganglion cyst for any changes.  Keep all follow-up visits as told by your health care provider. This is important. Contact a health care provider if:  Your ganglion cyst becomes larger or more painful.  You have pus coming from the lump.  You have weakness or numbness in the affected area.  You have a fever or chills. Get help right away if:  You have a fever and have any of these in the cyst area: ? Increased redness. ? Red streaks. ? Swelling. Summary  A   ganglion cyst is a non-cancerous, fluid-filled lump that occurs near a joint, tendon, or ligament.  Ganglion cysts most often develop in the hand or wrist, but they can also develop in the shoulder, elbow, hip, knee, ankle, or foot.  Ganglion cysts often go away on their own without treatment. This information is not intended to replace advice given to you by your health care provider. Make sure you discuss any questions you have with your health care provider. Document Revised: 01/20/2020 Document Reviewed:  01/20/2020 Elsevier Patient Education  2021 [place] Tenosynovitis  De Quervain's tenosynovitis is a condition that causes inflammation of the tendon on the thumb side of the wrist. Tendons are cords of tissue that connect bones to muscles. The tendons in the hand pass through a tunnel called a sheath. A slippery layer of tissue (synovium) lets the tendons move smoothly in the sheath. With de Quervain's tenosynovitis, the sheath swells or thickens, causing friction and pain. The condition is also called de Quervain's disease and de Quervain's syndrome. It occurs most often in women who are 53-61 years old. What are the causes? The exact cause of this condition is not known. It may be associated with overuse of the hand and wrist. What increases the risk? You are more likely to develop this condition if you:  Use your hands far more than normal, especially if you repeat certain movements that involve twisting your hand or using a tight grip.  Are pregnant.  Are a middle-aged woman.  Have rheumatoid arthritis.  Have diabetes. What are the signs or symptoms? The main symptom of this condition is pain on the thumb side of the wrist. The pain may get worse when you grasp something or turn your wrist. Other symptoms may include:  Pain that extends up the forearm.  Swelling of your wrist and hand.  Trouble moving the thumb and wrist.  A sensation of snapping in the wrist.  A bump filled with fluid (cyst) in the area of the pain. How is this diagnosed? This condition may be diagnosed based on:  Your symptoms and medical history.  A physical exam. During the exam, your health care provider may do a simple test W[place] test) that involves pulling your thumb and wrist to see if this causes pain. You may also need to have an X-ray or ultrasound. How is this treated? Treatment for this condition may include:  Avoiding any activity that causes pain and  swelling.  Taking medicines. Anti-inflammatory medicines and corticosteroid injections may be used to reduce inflammation and relieve pain.  Wearing a splint.  Having surgery. This may be needed if other treatments do not work. Once the pain and swelling have gone down, you may start:  Physical therapy. This includes exercises to improve movement and strength in your wrist and thumb.  Occupational therapy. This includes adjusting how you move your wrist. Follow these instructions at home: If you have a splint:  Wear the splint as told by your health care provider. Remove it only as told by your health care provider.  Loosen the splint if your fingers tingle, become numb, or turn cold and blue.  Keep the splint clean.  If the splint is not waterproof: ? Do not let it get wet. ? Cover it with a watertight covering when you take a bath or a shower. Managing pain, stiffness, and swelling  Avoid movements and activities that cause pain and swelling in the wrist area.  If directed, put  ice on the painful area. This may be helpful after doing activities that involve the sore wrist. To do this: ? Put ice in a plastic bag. ? Place a towel between your skin and the bag. ? Leave the ice on for 20 minutes, 2-3 times a day. ? Remove the ice if your skin turns bright red. This is very important. If you cannot feel pain, heat, or cold, you have a greater risk of damage to the area.  Move your fingers often to reduce stiffness and swelling.  Raise (elevate) the injured area above the level of your heart while you are sitting or lying down.   General instructions  Return to your normal activities as told by your health care provider. Ask your health care provider what activities are safe for you.  Take over-the-counter and prescription medicines only as told by your health care provider.  Keep all follow-up visits. This is important. Contact a health care provider if:  Your pain medicine  does not help.  Your pain gets worse.  You develop new symptoms. Summary  De Quervain's tenosynovitis is a condition that causes inflammation of the tendon on the thumb side of the wrist.  The condition occurs most often in women who are 75-80 years old.  The exact cause of this condition is not known. It may be associated with overuse of the hand and wrist.  Treatment starts with avoiding activity that causes pain or swelling in the wrist area. Other treatments may include wearing a splint and taking medicine. Sometimes, surgery is needed. This information is not intended to replace advice given to you by your health care provider. Make sure you discuss any questions you have with your health care provider. Document Revised: 02/10/2020 Document Reviewed: 02/10/2020 Elsevier Patient Education  2021 [employer].

## 2020-12-14 NOTE — Progress Notes (Signed)
Subjective:    Patient ID: [name], female    DOB: 04-Oct-1951, 70 y.o.   MRN: [id number]  No chief complaint on file.   HPI Patient was seen today for ongoing concern. Patient endorses R wrist pain x 1 month. Started after painting a large canvas and pressing brush into the canvas.  Pt unable to turn around doorknob without using the other hand to assist. When lifting a pot or pan has to use the left hand to support the item. Having difficulty brushing her teeth 2/2 discomfort. Tried Tylenol which helps.  Also tried ice, heat and a brace. Patient is right-handed.  Past Medical History:  Diagnosis Date  . ABDOMINAL PAIN 05/01/2010  . DEPRESSION 07/21/2007  . DIVERTICULOSIS, COLON 07/26/2008  . Headache(784.0) 07/26/2008  . Pityriasis 09/13/2019    Allergies  Allergen Reactions  . Sulfamethoxazole Hives    Happened in childhood, does not remember reaction.    ROS General: Denies fever, chills, night sweats, changes in weight, changes in appetite HEENT: Denies headaches, ear pain, changes in vision, rhinorrhea, sore throat CV: Denies CP, palpitations, SOB, orthopnea Pulm: Denies SOB, cough, wheezing GI: Denies abdominal pain, nausea, vomiting, diarrhea, constipation GU: Denies dysuria, hematuria, frequency, vaginal discharge Msk: Denies muscle cramps, joint pains  + right wrist pain Neuro: Denies weakness, numbness, tingling Skin: Denies rashes, bruising Psych: Denies depression, anxiety, hallucinations    Objective:    Blood pressure 124/80, pulse (!) 105, temperature 98.4 F (36.9 C), temperature source Oral, weight 161 lb (73 kg), SpO2 96 %.   Gen. Pleasant, well-nourished, in no distress, normal affect   HEENT: [place]/AT, face symmetric, conjunctiva clear, no scleral icterus, PERRLA, EOMI, nares patent without drainage Lungs: no accessory muscle use Cardiovascular: RRR, no peripheral edema Musculoskeletal: Lateral right wrist with mild edema and a nodule present. Pain  with extension of right thumb. Nodule, slightly mobile.  No cyanosis or clubbing, normal tone Neuro:  A&Ox3, CN II-XII intact, normal gait Skin:  Warm, no lesions/ rash   Wt Readings from Last 3 Encounters:  12/14/20 161 lb (73 kg)  06/01/20 161 lb (73 kg)  03/14/20 159 lb (72.1 kg)    Lab Results  Component Value Date   WBC 4.8 08/08/2011   HGB 13.3 08/08/2011   HCT 39.6 08/08/2011   PLT 230 08/08/2011   GLUCOSE 98 07/23/2019   CHOL 151 07/23/2019   TRIG 92.0 07/23/2019   HDL 61.30 07/23/2019   LDLCALC 71 07/23/2019   ALT 10 08/08/2011   AST 19 08/08/2011   NA 142 07/23/2019   K 4.3 07/23/2019   CL 105 07/23/2019   CREATININE 0.86 07/23/2019   BUN 15 07/23/2019   CO2 29 07/23/2019   TSH 1.58 07/21/2008   INR 0.97 06/29/2011    Assessment/Plan:  Right wrist pain -Discussed possible causes including ganglion cyst versus de Quervain's tenosynovitis. Discussed various treatment options. -Difficulty assessing for de Quervain's as mobile nodule is present -Continue supportive care including Tylenol compression, rest, ice, heat. Can also use topical analgesics -We will place referral for imaging such as ultrasound and further treatment.  - Plan: Ambulatory referral to Hand Surgery  F/u as needed  Grier Mitts, MD

## 2020-12-15 NOTE — Telephone Encounter (Signed)
Pt was seen in the office yesterday, nothing further needed

## 2020-12-26 DIAGNOSIS — M1811 Unilateral primary osteoarthritis of first carpometacarpal joint, right hand: Secondary | ICD-10-CM | POA: Diagnosis not present

## 2020-12-26 DIAGNOSIS — M25531 Pain in right wrist: Secondary | ICD-10-CM | POA: Diagnosis not present

## 2020-12-26 DIAGNOSIS — M19041 Primary osteoarthritis, right hand: Secondary | ICD-10-CM | POA: Diagnosis not present

## 2021-01-01 ENCOUNTER — Encounter: Payer: Self-pay | Admitting: Family Medicine

## 2021-01-03 DIAGNOSIS — H2513 Age-related nuclear cataract, bilateral: Secondary | ICD-10-CM | POA: Diagnosis not present

## 2021-01-29 ENCOUNTER — Encounter: Payer: Self-pay | Admitting: Physical Medicine and Rehabilitation

## 2021-01-30 ENCOUNTER — Telehealth: Payer: Self-pay

## 2021-01-30 ENCOUNTER — Telehealth: Payer: Self-pay | Admitting: Physical Medicine and Rehabilitation

## 2021-01-30 NOTE — Telephone Encounter (Signed)
Returned patients call.

## 2021-01-30 NOTE — Telephone Encounter (Signed)
Pt called with a question for courtney about injections.   She would like to know how far apart does cortizone injections need to be ?   She just got an apt with [facility] for Wednesday  but she has an upcoming apt Monday for inj in her hand.

## 2021-01-30 NOTE — Telephone Encounter (Signed)
It really is not a big deal to have 2 injections like that done that close together if we do not do that on an ongoing basis one after another.  These 2 injections done back to back should be okay without any ill effect.

## 2021-01-30 NOTE — Telephone Encounter (Signed)
Please advise. Patient is scheduled for TF this Wednesday and injection for hand on 3/28.

## 2021-01-30 NOTE — Telephone Encounter (Signed)
Patient returned call asked for a call back. The number to contact patient is [phone]

## 2021-01-31 NOTE — Telephone Encounter (Signed)
Called patient to advise  °

## 2021-02-01 ENCOUNTER — Other Ambulatory Visit: Payer: Self-pay

## 2021-02-01 ENCOUNTER — Ambulatory Visit (INDEPENDENT_AMBULATORY_CARE_PROVIDER_SITE_OTHER): Payer: Medicare Other | Admitting: Physical Medicine and Rehabilitation

## 2021-02-01 ENCOUNTER — Ambulatory Visit: Payer: Self-pay

## 2021-02-01 ENCOUNTER — Encounter: Payer: Self-pay | Admitting: Physical Medicine and Rehabilitation

## 2021-02-01 VITALS — BP 131/84 | HR 98

## 2021-02-01 DIAGNOSIS — M5416 Radiculopathy, lumbar region: Secondary | ICD-10-CM | POA: Diagnosis not present

## 2021-02-01 DIAGNOSIS — M48062 Spinal stenosis, lumbar region with neurogenic claudication: Secondary | ICD-10-CM

## 2021-02-01 MED ORDER — METHYLPREDNISOLONE ACETATE 80 MG/ML IJ SUSP
80.0000 mg | Freq: Once | INTRAMUSCULAR | Status: AC
  Administered 2021-02-01: 80 mg

## 2021-02-01 NOTE — Progress Notes (Signed)
Pt state lower back pain that travels down her left leg. Pt state laying down and climbing stairs makes the pain worse. Pt state she take over the counter pain meds to help ease the pain Pt has hx of inj on 10/27/20 pt state it helpped for a few weeks with 80% relief.  Numeric Pain Rating Scale and Functional Assessment Average Pain 2   In the last MONTH (on 0-10 scale) has pain interfered with the following?  1. General activity like being  able to carry out your everyday physical activities such as walking, climbing stairs, carrying groceries, or moving a chair?  Rating(10)   +Driver, -BT, -Dye Allergies.

## 2021-02-01 NOTE — Progress Notes (Signed)
[facility] - 70 y.o. female MRN [id number]  Date of birth: 1951/06/23  Office Visit Note: Visit Date: 02/01/2021 PCP: Billie Ruddy, MD Referred by: Billie Ruddy, MD  Subjective: Chief Complaint  Patient presents with  . Lower Back - Pain  . Left Leg - Pain   HPI:  [name] is a 70 y.o. female who comes in today for planned repeat Left L4-L5 Lumbar epidural steroid injection with fluoroscopic guidance.  The patient has failed conservative care including home exercise, medications, time and activity modification.  This injection will be diagnostic and hopefully therapeutic.  Please see requesting physician notes for further details and justification. Patient received more than 50% pain relief from prior injection.  Injections seem to last about 3 months.  She has severe multifactorial stenosis at L4-5 with a small grade 1 listhesis.  She likely would be a good candidate for decompression but would be hard to know if they would need to fuse that with a small listhesis.  She does want to consider surgery at some point for more definitive care.  We will repeat the injection today.  She is having both sided symptoms more left than right but really both sides.  She really only wants to repeat the left-sided injection since it helped before and she was having both sided symptoms then.  I discussed briefly that we typically will do both sides left and right with left-sided symptoms but I did agree to repeat the left sided injection since she got good relief last time.Marland Kitchen  Referring: Dr. Landry Dyke Dean    ROS Otherwise per HPI.  Assessment & Plan: Visit Diagnoses:    ICD-10-CM   1. Lumbar radiculopathy  M54.16 XR C-ARM NO REPORT    Epidural Steroid injection    methylPREDNISolone acetate (DEPO-MEDROL) injection 80 mg  2. Spinal stenosis of lumbar region with neurogenic claudication  M48.062 XR C-ARM NO REPORT    Epidural Steroid injection    methylPREDNISolone acetate  (DEPO-MEDROL) injection 80 mg    Plan: No additional findings.   Meds & Orders:  Meds ordered this encounter  Medications  . methylPREDNISolone acetate (DEPO-MEDROL) injection 80 mg    Orders Placed This Encounter  Procedures  . XR C-ARM NO REPORT  . Epidural Steroid injection    Follow-up: Return if symptoms worsen or fail to improve.   Procedures: No procedures performed  Lumbosacral Transforaminal Epidural Steroid Injection - Sub-Pedicular Approach with Fluoroscopic Guidance  Patient: [name]      Date of Birth: May 30, 1951 MRN: [id number] PCP: Billie Ruddy, MD      Visit Date: 02/01/2021   Universal Protocol:    Date/Time: 02/01/2021  Consent Given By: the patient  Position: PRONE  Additional Comments: Vital signs were monitored before and after the procedure. Patient was prepped and draped in the usual sterile fashion. The correct patient, procedure, and site was verified.   Injection Procedure Details:   Procedure diagnoses: Lumbar radiculopathy [M54.16]    Meds Administered:  Meds ordered this encounter  Medications  . methylPREDNISolone acetate (DEPO-MEDROL) injection 80 mg    Laterality: Left  Location/Site:  L4-L5  Needle:5.0 in., 22 ga.  Short bevel or Quincke spinal needle  Needle Placement: Transforaminal  Findings:    -Comments: Excellent flow of contrast along the nerve, nerve root and into the epidural space.  Procedure Details: After squaring off the end-plates to get a true AP view, the C-arm was positioned so that an  oblique view of the foramen as noted above was visualized. The target area is just inferior to the "nose of the scotty dog" or sub pedicular. The soft tissues overlying this structure were infiltrated with 2-3 ml. of 1% Lidocaine without Epinephrine.  The spinal needle was inserted toward the target using a "trajectory" view along the fluoroscope beam.  Under AP and lateral visualization, the needle was  advanced so it did not puncture dura and was located close the 6 O'Clock position of the pedical in AP tracterory. Biplanar projections were used to confirm position. Aspiration was confirmed to be negative for CSF and/or blood. A 1-2 ml. volume of Isovue-250 was injected and flow of contrast was noted at each level. Radiographs were obtained for documentation purposes.   After attaining the desired flow of contrast documented above, a 0.5 to 1.0 ml test dose of 0.25% Marcaine was injected into each respective transforaminal space.  The patient was observed for 90 seconds post injection.  After no sensory deficits were reported, and normal lower extremity motor function was noted,   the above injectate was administered so that equal amounts of the injectate were placed at each foramen (level) into the transforaminal epidural space.   Additional Comments:  The patient tolerated the procedure well Dressing: 2 x 2 sterile gauze and Band-Aid    Post-procedure details: Patient was observed during the procedure. Post-procedure instructions were reviewed.  Patient left the clinic in stable condition.      Clinical History: MRI LUMBAR SPINE WITHOUT CONTRAST  TECHNIQUE: Multiplanar, multisequence MR imaging of the lumbar spine was performed. No intravenous contrast was administered.  COMPARISON:  Plain films April 25, 2020.  FINDINGS: Segmentation:  Standard.  Alignment:  Grade 1 anterolisthesis of L4 over L5, degenerative.  Vertebrae:  No fracture, evidence of discitis, or bone lesion.  Conus medullaris and cauda equina: Conus extends to the T12-L1 level. Conus and cauda equina appear normal.  Paraspinal and other soft tissues: Negative.  Disc levels:  T12-L1:No spinal canal or neural foraminal stenosis.  L1-2:No spinal canal or neural foraminal stenosis.  L2-3:No spinal canal or neural foraminal stenosis.  L3-4:Shallow disc bulge, moderate facet degenerative  changes and ligamentum flavum redundancy without significant spinal canal or neural foraminal stenosis.  L4-5:Disc bulge/disc uncovering, prominent facet degenerative changes and ligamentum flavum redundancy resulting in severe spinal canal stenosis and mild bilateral neural foraminal. Inter spinous ligament degeneration with associated degenerative cysts.  L5-S1: Disc bulge with superimposed small central disc protrusion and moderate facet degenerative changes.No significant spinal canal or neural foraminal stenosis.  IMPRESSION: 1. Multilevel degenerative changes of the lumbar spine as described above, worst at L4-L5 where there is severe spinal canal stenosis and mild bilateral neural foraminal stenosis. 2. No high-grade neural foraminal stenosis at any level.   Electronically Signed   By: Pedro Earls M.D.   On: 10/23/2020 14:28     Objective:  VS:  HT:    WT:   BMI:     BP:131/84  HR:98bpm  TEMP: ( )  RESP:  Physical Exam Vitals and nursing note reviewed.  Constitutional:      General: She is not in acute distress.    Appearance: Normal appearance. She is not ill-appearing.  HENT:     Head: Normocephalic and atraumatic.     Right Ear: External ear normal.     Left Ear: External ear normal.  Eyes:     Extraocular Movements: Extraocular movements intact.  Cardiovascular:  Rate and Rhythm: Normal rate.     Pulses: Normal pulses.  Pulmonary:     Effort: Pulmonary effort is normal. No respiratory distress.  Abdominal:     General: There is no distension.     Palpations: Abdomen is soft.  Musculoskeletal:        General: Tenderness present.     Cervical back: Neck supple.     Right lower leg: No edema.     Left lower leg: No edema.     Comments: Patient has good distal strength with no pain over the greater trochanters.  No clonus or focal weakness.  Skin:    Findings: No erythema, lesion or rash.  Neurological:     General: No  focal deficit present.     Mental Status: She is alert and oriented to person, place, and time.     Sensory: No sensory deficit.     Motor: No weakness or abnormal muscle tone.     Coordination: Coordination normal.  Psychiatric:        Mood and Affect: Mood normal.        Behavior: Behavior normal.      Imaging: No results found.

## 2021-02-01 NOTE — Patient Instructions (Signed)

## 2021-02-01 NOTE — Procedures (Signed)
Lumbosacral Transforaminal Epidural Steroid Injection - Sub-Pedicular Approach with Fluoroscopic Guidance  Patient: [name]      Date of Birth: 08/20/1951 MRN: [id number] PCP: Billie Ruddy, MD      Visit Date: 02/01/2021   Universal Protocol:    Date/Time: 02/01/2021  Consent Given By: the patient  Position: PRONE  Additional Comments: Vital signs were monitored before and after the procedure. Patient was prepped and draped in the usual sterile fashion. The correct patient, procedure, and site was verified.   Injection Procedure Details:   Procedure diagnoses: Lumbar radiculopathy [M54.16]    Meds Administered:  Meds ordered this encounter  Medications  . methylPREDNISolone acetate (DEPO-MEDROL) injection 80 mg    Laterality: Left  Location/Site:  L4-L5  Needle:5.0 in., 22 ga.  Short bevel or Quincke spinal needle  Needle Placement: Transforaminal  Findings:    -Comments: Excellent flow of contrast along the nerve, nerve root and into the epidural space.  Procedure Details: After squaring off the end-plates to get a true AP view, the C-arm was positioned so that an oblique view of the foramen as noted above was visualized. The target area is just inferior to the "nose of the scotty dog" or sub pedicular. The soft tissues overlying this structure were infiltrated with 2-3 ml. of 1% Lidocaine without Epinephrine.  The spinal needle was inserted toward the target using a "trajectory" view along the fluoroscope beam.  Under AP and lateral visualization, the needle was advanced so it did not puncture dura and was located close the 6 O'Clock position of the pedical in AP tracterory. Biplanar projections were used to confirm position. Aspiration was confirmed to be negative for CSF and/or blood. A 1-2 ml. volume of Isovue-250 was injected and flow of contrast was noted at each level. Radiographs were obtained for documentation purposes.   After attaining the  desired flow of contrast documented above, a 0.5 to 1.0 ml test dose of 0.25% Marcaine was injected into each respective transforaminal space.  The patient was observed for 90 seconds post injection.  After no sensory deficits were reported, and normal lower extremity motor function was noted,   the above injectate was administered so that equal amounts of the injectate were placed at each foramen (level) into the transforaminal epidural space.   Additional Comments:  The patient tolerated the procedure well Dressing: 2 x 2 sterile gauze and Band-Aid    Post-procedure details: Patient was observed during the procedure. Post-procedure instructions were reviewed.  Patient left the clinic in stable condition.

## 2021-02-06 DIAGNOSIS — M1811 Unilateral primary osteoarthritis of first carpometacarpal joint, right hand: Secondary | ICD-10-CM | POA: Diagnosis not present

## 2021-04-03 DIAGNOSIS — M654 Radial styloid tenosynovitis [de Quervain]: Secondary | ICD-10-CM | POA: Diagnosis not present

## 2021-04-03 DIAGNOSIS — M1811 Unilateral primary osteoarthritis of first carpometacarpal joint, right hand: Secondary | ICD-10-CM | POA: Diagnosis not present

## 2021-04-09 ENCOUNTER — Other Ambulatory Visit: Payer: Self-pay | Admitting: Family Medicine

## 2021-04-09 DIAGNOSIS — Z87828 Personal history of other (healed) physical injury and trauma: Secondary | ICD-10-CM

## 2021-04-09 DIAGNOSIS — Z8739 Personal history of other diseases of the musculoskeletal system and connective tissue: Secondary | ICD-10-CM

## 2021-05-01 ENCOUNTER — Encounter: Payer: Self-pay | Admitting: Physical Medicine and Rehabilitation

## 2021-05-01 NOTE — Progress Notes (Signed)
[name] - 70 y.o. female MRN [id number]  Date of birth: 12-Nov-1951  Office Visit Note: Visit Date: 10/26/2020 PCP: Billie Ruddy, MD Referred by: Billie Ruddy, MD  Subjective: Chief Complaint  Patient presents with   Lower Back - Pain   HPI: [name] is a 70 y.o. female who comes in today For evaluation management chronic worsening severe bilateral low back pain with left more than right buttock and leg pain.  She comes in today at the request of Dr. Anderson Malta.  He is really done well with conservative care and evaluation of her.  She has had this for quite some time now and just really worsening and limiting her daily activities.  She describes 5 out of 10 pain on average with no pain with sitting.  Today in the office 0 out of 10 pain just sitting.  It does really affect her activities of daily living most days.  She does tend to get along okay and just put up with it.  She reports again left more than right buttock pain some referral into the thighs posteriorly to the knee.  No paresthesias or tingling.  No focal weakness.  She reports history of oral prednisone which is helped at times.  She has also been to her chiropractor pretty faithfully and about 6 years ago she had a significant bout of sciatica that she refers to that Dr. Donovan Kail the chiropractor helped her quite a bit.  She has had MRI of the lumbar spine and this is reviewed with her today and pretty unequivocally shows pretty severe stenosis at L4-5 multifactorial.  We had a long discussion with her today on stenosis and treatments etc.  Review of Systems  Musculoskeletal:  Positive for back pain and joint pain.  All other systems reviewed and are negative. Otherwise per HPI.  Assessment & Plan: Visit Diagnoses:    ICD-10-CM   1. Lumbar radiculopathy  M54.16     2. 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 Chronic bilateral low back pain with bilateral sciatica   M54.42    M54.41    G89.29        Plan: Findings:  Chronic worsening severe low back pain with bilateral left much more than right radicular type pain posteriorly with neurogenic claudication symptoms.  Severe stenosis multifactorial 4-5.  History of chiropractic care and oral steroid medication and other medication without good relief overall although she puts up with it quite well.  At this point we are going to complete diagnostic L4 transforaminal injection on the left and see how she does.  Depending on that could continue with intermittent injection if they were successful.  She would be a good candidate for decompression treatment at that level.  I discussed that with her at length.  She can continue to follow with Dr. Marlou Sa as well from an orthopedic standpoint.   Meds & Orders: No orders of the defined types were placed in this encounter.  No orders of the defined types were placed in this encounter.   Follow-up: Return for Left L4 transforaminal epidural steroid injection.   Procedures: No procedures performed      Clinical History: MRI LUMBAR SPINE WITHOUT CONTRAST   TECHNIQUE: Multiplanar, multisequence MR imaging of the lumbar spine was performed. No intravenous contrast was administered.   COMPARISON:  Plain films April 25, 2020.   FINDINGS: Segmentation:  Standard.   Alignment:  Grade 1 anterolisthesis of L4 over L5, degenerative.   Vertebrae:  No fracture, evidence of discitis, or bone lesion.   Conus medullaris and cauda equina: Conus extends to the T12-L1 level. Conus and cauda equina appear normal.   Paraspinal and other soft tissues: Negative.   Disc levels:   T12-L1:No spinal canal or neural foraminal stenosis.   L1-2:No spinal canal or neural foraminal stenosis.   L2-3:No spinal canal or neural foraminal stenosis.   L3-4:Shallow disc bulge, moderate facet degenerative changes and ligamentum flavum redundancy without significant spinal canal  or neural foraminal stenosis.   L4-5:Disc bulge/disc uncovering, prominent facet degenerative changes and ligamentum flavum redundancy resulting in severe spinal canal stenosis and mild bilateral neural foraminal. Inter spinous ligament degeneration with associated degenerative cysts.   L5-S1: Disc bulge with superimposed small central disc protrusion and moderate facet degenerative changes.No significant spinal canal or neural foraminal stenosis.   IMPRESSION: 1. Multilevel degenerative changes of the lumbar spine as described above, worst at L4-L5 where there is severe spinal canal stenosis and mild bilateral neural foraminal stenosis. 2. No high-grade neural foraminal stenosis at any level.     Electronically Signed   By: Pedro Earls M.D.   On: 10/23/2020 14:28   She reports that she has never smoked. She has never used smokeless tobacco. No results for input(s): HGBA1C, LABURIC in the last 8760 hours.  Objective:  VS:  HT:    WT:   BMI:     BP:132/84  HR:96bpm  TEMP: ( )  RESP:  Physical Exam Vitals and nursing note reviewed.  Constitutional:      General: She is not in acute distress.    Appearance: Normal appearance. She is not ill-appearing.  HENT:     Head: Normocephalic and atraumatic.     Right Ear: External ear normal.     Left Ear: External ear normal.  Eyes:     Extraocular Movements: Extraocular movements intact.  Cardiovascular:     Rate and Rhythm: Normal rate.     Pulses: Normal pulses.  Pulmonary:     Effort: Pulmonary effort is normal. No respiratory distress.  Abdominal:     General: There is no distension.     Palpations: Abdomen is soft.  Musculoskeletal:        General: Tenderness present.     Cervical back: Neck supple.     Right lower leg: No edema.     Left lower leg: No edema.     Comments: Patient has good distal strength with no pain over the greater trochanters.  No clonus or focal weakness.  Skin:    Findings:  No erythema, lesion or rash.  Neurological:     General: No focal deficit present.     Mental Status: She is alert and oriented to person, place, and time.     Sensory: No sensory deficit.     Motor: No weakness or abnormal muscle tone.     Coordination: Coordination normal.  Psychiatric:        Mood and Affect: Mood normal.        Behavior: Behavior normal.    Ortho Exam  Imaging: No results found.  Past Medical/Family/Surgical/Social History: Medications & Allergies reviewed per EMR, new medications updated. Patient Active Problem List   Diagnosis Date Noted   ABDOMINAL PAIN 05/01/2010   DIVERTICULOSIS, COLON 07/26/2008   HEADACHE 07/26/2008   DEPRESSION 07/21/2007   Past Medical History:  Diagnosis Date   ABDOMINAL PAIN  05/01/2010   DEPRESSION 07/21/2007   DIVERTICULOSIS, COLON 07/26/2008   Headache(784.0) 07/26/2008   Pityriasis 09/13/2019   Family History  Problem Relation Age of Onset   Diabetes Mother    Heart disease Mother    Obesity Mother    Dementia Mother    Heart disease Father    Stroke Father    Colon cancer Paternal Aunt    Other Paternal Aunt        esophageal stricture   Esophageal cancer Neg Hx    Rectal cancer Neg Hx    Stomach cancer Neg Hx    Past Surgical History:  Procedure Laterality Date   COLONOSCOPY     ERCP  august 2012   LAPAROSCOPIC CHOLECYSTECTOMY  08/14/2011   WISDOM TOOTH EXTRACTION     Social History   Occupational History   Not on file  Tobacco Use   Smoking status: Never   Smokeless tobacco: Never  Vaping Use   Vaping Use: Never used  Substance and Sexual Activity   Alcohol use: No   Drug use: No   Sexual activity: Not on file

## 2021-05-08 ENCOUNTER — Telehealth: Payer: Self-pay

## 2021-05-08 NOTE — Telephone Encounter (Signed)
Pt called and would like a CB regarding another apt with Dr. Ernestina Patches.

## 2021-05-08 NOTE — Telephone Encounter (Signed)
Called pt and LVM #1 

## 2021-05-09 NOTE — Telephone Encounter (Signed)
See MyChart

## 2021-05-09 NOTE — Telephone Encounter (Signed)
Left L4 TF on 10/27/2020. Ok to repeat if helped, same problem/side, and no new injury?

## 2021-05-22 ENCOUNTER — Other Ambulatory Visit: Payer: Self-pay

## 2021-05-22 ENCOUNTER — Ambulatory Visit (INDEPENDENT_AMBULATORY_CARE_PROVIDER_SITE_OTHER): Payer: Medicare Other | Admitting: Physical Medicine and Rehabilitation

## 2021-05-22 ENCOUNTER — Encounter: Payer: Self-pay | Admitting: Physical Medicine and Rehabilitation

## 2021-05-22 ENCOUNTER — Ambulatory Visit: Payer: Self-pay

## 2021-05-22 VITALS — BP 132/84 | HR 84

## 2021-05-22 DIAGNOSIS — M48062 Spinal stenosis, lumbar region with neurogenic claudication: Secondary | ICD-10-CM

## 2021-05-22 DIAGNOSIS — M5416 Radiculopathy, lumbar region: Secondary | ICD-10-CM

## 2021-05-22 DIAGNOSIS — M4316 Spondylolisthesis, lumbar region: Secondary | ICD-10-CM

## 2021-05-22 MED ORDER — METHYLPREDNISOLONE ACETATE 80 MG/ML IJ SUSP
80.0000 mg | Freq: Once | INTRAMUSCULAR | Status: AC
  Administered 2021-05-22: 80 mg

## 2021-05-22 NOTE — Progress Notes (Signed)
Pt state lower back pain that travels down the posterior of both legs. Pt state getting out of bed is when she feels the pain. Pt state bending over makes the pain worse. Pt state she has to sleep with pillows in between her legs. Pt state she takes over the counter pain meds to help ease her pain. Pt has hx of inj on 3/23/2 pt state It helped.  Numeric Pain Rating Scale and Functional Assessment Average Pain 3   In the last MONTH (on 0-10 scale) has pain interfered with the following?  1. General activity like being  able to carry out your everyday physical activities such as walking, climbing stairs, carrying groceries, or moving a chair?  Rating(10)   +Driver, -BT, -Dye Allergies.

## 2021-05-22 NOTE — Patient Instructions (Signed)

## 2021-05-23 NOTE — Progress Notes (Signed)
GELISA TIEKEN - 70 y.o. female MRN [id number]  Date of birth: 1950-12-31  Office Visit Note: Visit Date: 05/22/2021 PCP: Billie Ruddy, MD Referred by: Billie Ruddy, MD  Subjective: Chief Complaint  Patient presents with   Lower Back - Pain   Left Leg - Pain   Right Leg - Pain   HPI:  LORALIE MALTA is a 70 y.o. female who comes in today for planned repeat Bilateral L4-L5  Lumbar Transforaminal epidural steroid injection with fluoroscopic guidance.  The patient has failed conservative care including home exercise, medications, time and activity modification.  This injection will be diagnostic and hopefully therapeutic.  Please see requesting physician notes for further details and justification. Patient received more than 50% pain relief from prior injection.  I did discuss with her today once again the more definitive treatment which would be a decompression at that level although she has a small listhesis.  She is really not interested in surgery at this point.  I would make referral if she were interested in more information.  Referring: Dr. Landry Dyke Dean   ROS Otherwise per HPI.  Assessment & Plan: Visit Diagnoses:    ICD-10-CM   1. Lumbar radiculopathy  M54.16 XR C-ARM NO REPORT    Epidural Steroid injection    methylPREDNISolone acetate (DEPO-MEDROL) injection 80 mg    2. 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 Spondylolisthesis of lumbar region  M43.16       Plan: No additional findings.   Meds & Orders:  Meds ordered this encounter  Medications   methylPREDNISolone acetate (DEPO-MEDROL) injection 80 mg    Orders Placed This Encounter  Procedures   XR C-ARM NO REPORT   Epidural Steroid injection    Follow-up: Return if symptoms worsen or fail to improve.   Procedures: No procedures performed  Lumbosacral Transforaminal Epidural Steroid Injection - Sub-Pedicular Approach with Fluoroscopic Guidance  Patient:  SAAVI MCEACHRON      Date of Birth: [birth year] MRN: [id number] PCP: Billie Ruddy, MD      Visit Date: 05/22/2021   Universal Protocol:    Date/Time: 05/22/2021  Consent Given By: the patient  Position: PRONE  Additional Comments: Vital signs were monitored before and after the procedure. Patient was prepped and draped in the usual sterile fashion. The correct patient, procedure, and site was verified.   Injection Procedure Details:   Procedure diagnoses: Lumbar radiculopathy [M54.16]    Meds Administered:  Meds ordered this encounter  Medications   methylPREDNISolone acetate (DEPO-MEDROL) injection 80 mg    Laterality: Bilateral  Location/Site:  L4-L5  Needle:5.0 in., 22 ga.  Short bevel or Quincke spinal needle  Needle Placement: Transforaminal  Findings:    -Comments: Excellent flow of contrast along the nerve, nerve root and into the epidural space.  Procedure Details: After squaring off the end-plates to get a true AP view, the C-arm was positioned so that an oblique view of the foramen as noted above was visualized. The target area is just inferior to the "nose of the scotty dog" or sub pedicular. The soft tissues overlying this structure were infiltrated with 2-3 ml. of 1% Lidocaine without Epinephrine.  The spinal needle was inserted toward the target using a "trajectory" view along the fluoroscope beam.  Under AP and lateral visualization, the needle was advanced so it did not puncture dura and was located close the 6 O'Clock position of the pedical in  AP tracterory. Biplanar projections were used to confirm position. Aspiration was confirmed to be negative for CSF and/or blood. A 1-2 ml. volume of Isovue-250 was injected and flow of contrast was noted at each level. Radiographs were obtained for documentation purposes.   After attaining the desired flow of contrast documented above, a 0.5 to 1.0 ml test dose of 0.25% Marcaine was injected into each  respective transforaminal space.  The patient was observed for 90 seconds post injection.  After no sensory deficits were reported, and normal lower extremity motor function was noted,   the above injectate was administered so that equal amounts of the injectate were placed at each foramen (level) into the transforaminal epidural space.   Additional Comments:  The patient tolerated the procedure well Dressing: 2 x 2 sterile gauze and Band-Aid    Post-procedure details: Patient was observed during the procedure. Post-procedure instructions were reviewed.  Patient left the clinic in stable condition.    Clinical History: MRI LUMBAR SPINE WITHOUT CONTRAST   TECHNIQUE: Multiplanar, multisequence MR imaging of the lumbar spine was performed. No intravenous contrast was administered.   COMPARISON:  Plain films April 25, 2020.   FINDINGS: Segmentation:  Standard.   Alignment:  Grade 1 anterolisthesis of L4 over L5, degenerative.   Vertebrae:  No fracture, evidence of discitis, or bone lesion.   Conus medullaris and cauda equina: Conus extends to the T12-L1 level. Conus and cauda equina appear normal.   Paraspinal and other soft tissues: Negative.   Disc levels:   T12-L1:No spinal canal or neural foraminal stenosis.   L1-2:No spinal canal or neural foraminal stenosis.   L2-3:No spinal canal or neural foraminal stenosis.   L3-4:Shallow disc bulge, moderate facet degenerative changes and ligamentum flavum redundancy without significant spinal canal or neural foraminal stenosis.   L4-5:Disc bulge/disc uncovering, prominent facet degenerative changes and ligamentum flavum redundancy resulting in severe spinal canal stenosis and mild bilateral neural foraminal. Inter spinous ligament degeneration with associated degenerative cysts.   L5-S1: Disc bulge with superimposed small central disc protrusion and moderate facet degenerative changes.No significant spinal canal or neural  foraminal stenosis.   IMPRESSION: 1. Multilevel degenerative changes of the lumbar spine as described above, worst at L4-L5 where there is severe spinal canal stenosis and mild bilateral neural foraminal stenosis. 2. No high-grade neural foraminal stenosis at any level.     Electronically Signed   By: Pedro Earls M.D.   On: 10/23/2020 14:28     Objective:  VS:  HT:    WT:   BMI:     BP:132/84  HR:84bpm  TEMP: ( )  RESP:  Physical Exam Vitals and nursing note reviewed.  Constitutional:      General: She is not in acute distress.    Appearance: Normal appearance. She is not ill-appearing.  HENT:     Head: Normocephalic and atraumatic.     Right Ear: External ear normal.     Left Ear: External ear normal.  Eyes:     Extraocular Movements: Extraocular movements intact.  Cardiovascular:     Rate and Rhythm: Normal rate.     Pulses: Normal pulses.  Pulmonary:     Effort: Pulmonary effort is normal. No respiratory distress.  Abdominal:     General: There is no distension.     Palpations: Abdomen is soft.  Musculoskeletal:        General: Tenderness present.     Cervical back: Neck supple.     Right  lower leg: No edema.     Left lower leg: No edema.     Comments: Patient has good distal strength with no pain over the greater trochanters.  No clonus or focal weakness.  Skin:    Findings: No erythema, lesion or rash.  Neurological:     General: No focal deficit present.     Mental Status: She is alert and oriented to person, place, and time.     Sensory: No sensory deficit.     Motor: No weakness or abnormal muscle tone.     Coordination: Coordination normal.  Psychiatric:        Mood and Affect: Mood normal.        Behavior: Behavior normal.     Imaging: XR C-ARM NO REPORT  Result Date: 05/22/2021 Please see Notes tab for imaging impression.

## 2021-05-23 NOTE — Procedures (Signed)
Lumbosacral Transforaminal Epidural Steroid Injection - Sub-Pedicular Approach with Fluoroscopic Guidance  Patient: [name]      Date of Birth: Aug 22, 1951 MRN: [id number] PCP: Billie Ruddy, MD      Visit Date: 05/22/2021   Universal Protocol:    Date/Time: 05/22/2021  Consent Given By: the patient  Position: PRONE  Additional Comments: Vital signs were monitored before and after the procedure. Patient was prepped and draped in the usual sterile fashion. The correct patient, procedure, and site was verified.   Injection Procedure Details:   Procedure diagnoses: Lumbar radiculopathy [M54.16]    Meds Administered:  Meds ordered this encounter  Medications   methylPREDNISolone acetate (DEPO-MEDROL) injection 80 mg    Laterality: Bilateral  Location/Site:  L4-L5  Needle:5.0 in., 22 ga.  Short bevel or Quincke spinal needle  Needle Placement: Transforaminal  Findings:    -Comments: Excellent flow of contrast along the nerve, nerve root and into the epidural space.  Procedure Details: After squaring off the end-plates to get a true AP view, the C-arm was positioned so that an oblique view of the foramen as noted above was visualized. The target area is just inferior to the "nose of the scotty dog" or sub pedicular. The soft tissues overlying this structure were infiltrated with 2-3 ml. of 1% Lidocaine without Epinephrine.  The spinal needle was inserted toward the target using a "trajectory" view along the fluoroscope beam.  Under AP and lateral visualization, the needle was advanced so it did not puncture dura and was located close the 6 O'Clock position of the pedical in AP tracterory. Biplanar projections were used to confirm position. Aspiration was confirmed to be negative for CSF and/or blood. A 1-2 ml. volume of Isovue-250 was injected and flow of contrast was noted at each level. Radiographs were obtained for documentation purposes.   After attaining the  desired flow of contrast documented above, a 0.5 to 1.0 ml test dose of 0.25% Marcaine was injected into each respective transforaminal space.  The patient was observed for 90 seconds post injection.  After no sensory deficits were reported, and normal lower extremity motor function was noted,   the above injectate was administered so that equal amounts of the injectate were placed at each foramen (level) into the transforaminal epidural space.   Additional Comments:  The patient tolerated the procedure well Dressing: 2 x 2 sterile gauze and Band-Aid    Post-procedure details: Patient was observed during the procedure. Post-procedure instructions were reviewed.  Patient left the clinic in stable condition.

## 2021-06-15 ENCOUNTER — Encounter: Payer: Self-pay | Admitting: Family Medicine

## 2021-06-23 ENCOUNTER — Telehealth: Payer: Self-pay | Admitting: Physical Medicine and Rehabilitation

## 2021-06-23 ENCOUNTER — Telehealth: Payer: Self-pay

## 2021-06-23 NOTE — Telephone Encounter (Signed)
Patient would like to know if she could be worked into the schedule for a shoulder injury? Stated that its very painful and has tried Motrin, and biofreeze, but nothing has helped.  Cb# [phone].  Please advise.  Thank you.

## 2021-06-23 NOTE — Telephone Encounter (Signed)
Message has been sent.  See previous message in patient's chart.

## 2021-06-23 NOTE — Telephone Encounter (Signed)
Pt called for an update about being worked in. Pt states she received a call but when she answer no one was there. No notes on pt demo. Please call pt as soon as possible about this matter. Pt phone number is [phone].

## 2021-06-23 NOTE — Telephone Encounter (Signed)
IC s/w pt.  Offered appt for Wed-pt refused stating she had appt with PCP. She will call me if needed

## 2021-06-28 ENCOUNTER — Ambulatory Visit (INDEPENDENT_AMBULATORY_CARE_PROVIDER_SITE_OTHER): Payer: Medicare Other | Admitting: Orthopedic Surgery

## 2021-06-28 ENCOUNTER — Ambulatory Visit (INDEPENDENT_AMBULATORY_CARE_PROVIDER_SITE_OTHER): Payer: Medicare Other

## 2021-06-28 ENCOUNTER — Other Ambulatory Visit: Payer: Self-pay

## 2021-06-28 DIAGNOSIS — Z0000 Encounter for general adult medical examination without abnormal findings: Secondary | ICD-10-CM | POA: Diagnosis not present

## 2021-06-28 DIAGNOSIS — M79602 Pain in left arm: Secondary | ICD-10-CM

## 2021-06-28 MED ORDER — METHYLPREDNISOLONE 4 MG PO TBPK: ORAL_TABLET | ORAL | 0 refills | Status: DC

## 2021-06-28 NOTE — Progress Notes (Signed)
Virtual Visit via Telephone Note  I connected with  [name] on 06/28/21 at  8:45 AM EDT by telephone and verified that I am speaking with the correct person using two identifiers.  Medicare Annual Wellness visit completed telephonically due to Covid-19 pandemic.   Persons participating in this call: This Health Coach and this patient.   Location: Patient: Home Provider: Office   I discussed the limitations, risks, security and privacy concerns of performing an evaluation and management service by telephone and the availability of in person appointments. The patient expressed understanding and agreed to proceed.  Unable to perform video visit due to video visit attempted and failed and/or patient does not have video capability.   Some vital signs may be absent or patient reported.   Willette Brace, LPN   Subjective:   [name] is a 70 y.o. female who presents for Medicare Annual (Subsequent) preventive examination.  Review of Systems     Cardiac Risk Factors include: advanced age (>73mn, >>27women)     Objective:    Today's Vitals   06/28/21 0848  PainSc: 4    There is no height or weight on file to calculate BMI.  Advanced Directives 06/28/2021 06/22/2020 05/18/2020  Does Patient Have a Medical Advance Directive? Yes No No  Does patient want to make changes to medical advance directive? Yes (MAU/Ambulatory/Procedural Areas - Information given) No - Patient declined -    Current Medications (verified) Outpatient Encounter Medications as of 06/28/2021  Medication Sig   acetaminophen (TYLENOL) 500 MG tablet Take by mouth.   acyclovir (ZOVIRAX) 200 MG capsule TAKE ONE CAPSULE (200 MG TOTAL) BY MOUTH DAILY.   cyclobenzaprine (FLEXERIL) 10 MG tablet TAKE 1 TABLET BY MOUTH THREE TIMES A DAY AS NEEDED FOR MUSCLE SPASMS   escitalopram (LEXAPRO) 20 MG tablet TAKE ONE TABLET (20 MG TOTAL) BY MOUTH EVERY MORNING.   Multiple Vitamins-Minerals (MULTIVITAMIN GUMMIES  ADULT PO) Take by mouth.   No facility-administered encounter medications on file as of 06/28/2021.    Allergies (verified) Sulfur and Sulfamethoxazole   History: Past Medical History:  Diagnosis Date   ABDOMINAL PAIN 05/01/2010   DEPRESSION 07/21/2007   DIVERTICULOSIS, COLON 07/26/2008   Headache(784.0) 07/26/2008   Pityriasis 09/13/2019   Past Surgical History:  Procedure Laterality Date   COLONOSCOPY     ERCP  august 2012   LAPAROSCOPIC CHOLECYSTECTOMY  08/14/2011   WISDOM TOOTH EXTRACTION     Family History  Problem Relation Age of Onset   Diabetes Mother    Heart disease Mother    Obesity Mother    Dementia Mother    Heart disease Father    Stroke Father    Colon cancer Paternal Aunt    Other Paternal Aunt        esophageal stricture   Esophageal cancer Neg Hx    Rectal cancer Neg Hx    Stomach cancer Neg Hx    Social History   Socioeconomic History   Marital status: Married    Spouse name: Not on file   Number of children: Not on file   Years of education: Not on file   Highest education level: Not on file  Occupational History   Not on file  Tobacco Use   Smoking status: Never   Smokeless tobacco: Never  Vaping Use   Vaping Use: Never used  Substance and Sexual Activity   Alcohol use: No   Drug use: No   Sexual activity: Not on  file  Other Topics Concern   Not on file  Social History Narrative   Not on file   Social Determinants of Health   Financial Resource Strain: Low Risk    Difficulty of Paying Living Expenses: Not hard at all  Food Insecurity: No Food Insecurity   Worried About Charity fundraiser in the Last Year: Never true   [place] in the Last Year: Never true  Transportation Needs: No Transportation Needs   Lack of Transportation (Medical): No   Lack of Transportation (Non-Medical): No  Physical Activity: Inactive   Days of Exercise per Week: 0 days   Minutes of Exercise per Session: 0 min  Stress: No Stress Concern  Present   Feeling of Stress : Not at all  Social Connections: Socially Integrated   Frequency of Communication with Friends and Family: Twice a week   Frequency of Social Gatherings with Friends and Family: Once a week   Attends Religious Services: More than 4 times per year   Active Member of [employer] or Organizations: Yes   Attends Archivist Meetings: 1 to 4 times per year   Marital Status: Married    Tobacco Counseling Counseling given: Not Answered   Clinical Intake:  Pre-visit preparation completed: Yes  Pain : 0-10 Pain Score: 4  Pain Type: Chronic pain Pain Location: Arm Pain Orientation: Left Pain Descriptors / Indicators: Aching, Sharp Pain Onset: 1 to 4 weeks ago Pain Frequency: Constant     Nutritional Risks: None Diabetes: No  How often do you need to have someone help you when you read instructions, pamphlets, or other written materials from your doctor or pharmacy?: 1 - Never  Diabetic?No  Interpreter Needed?: No  Information entered by :: Charlott Rakes, LPN   Activities of Daily Living In your present state of health, do you have any difficulty performing the following activities: 06/28/2021  Hearing? N  Vision? N  Difficulty concentrating or making decisions? N  Walking or climbing stairs? N  Dressing or bathing? N  Doing errands, shopping? N  Preparing Food and eating ? N  Using the Toilet? N  In the past six months, have you accidently leaked urine? N  Do you have problems with loss of bowel control? N  Managing your Medications? N  Managing your Finances? N  Housekeeping or managing your Housekeeping? N  Some recent data might be hidden    Patient Care Team: Billie Ruddy, MD as PCP - General (Family Medicine) Inda Castle, MD (Inactive) as Consulting Physician (Gastroenterology)  Indicate any recent Medical Services you may have received from other than [facility] providers in the past year (date may be approximate).      Assessment:   This is a routine wellness examination for [name].  Hearing/Vision screen Hearing Screening - Comments:: Pt denies any hearing issues  Vision Screening - Comments:: Pt follows up with [facility] for annual eye exams   Dietary issues and exercise activities discussed: Current Exercise Habits: The patient does not participate in regular exercise at present   Goals Addressed             This Visit's Progress    Patient Stated       Just get shoulder better       Depression Screen PHQ 2/9 Scores 06/28/2021 10/19/2020 06/22/2020 10/16/2017  PHQ - 2 Score 0 3 0 0  PHQ- 9 Score - 3 - -    Fall Risk Fall  Risk  06/28/2021 06/22/2020 10/16/2017  Falls in the past year? 0 1 No  Number falls in past yr: 0 1 -  Comment - foot hung up in blankets and fell -  Injury with Fall? 0 0 -  Comment - already had back issues, -  Risk for fall due to : Impaired vision Impaired balance/gait;Impaired mobility;Impaired vision -  Follow up Falls prevention discussed Falls prevention discussed -    FALL RISK PREVENTION PERTAINING TO THE HOME:  Any stairs in or around the home? Yes  If so, are there any without handrails? No  Home free of loose throw rugs in walkways, pet beds, electrical cords, etc? Yes  Adequate lighting in your home to reduce risk of falls? Yes   ASSISTIVE DEVICES UTILIZED TO PREVENT FALLS:  Life alert? No  Use of a cane, walker or w/c? No  Grab bars in the bathroom? No  Shower chair or bench in shower? No  Elevated toilet seat or a handicapped toilet? No   TIMED UP AND GO:  Was the test performed? No .   Cognitive Function:     6CIT Screen 06/28/2021 06/22/2020  What Year? 0 points 0 points  What month? 0 points 0 points  What time? 0 points -  Count back from 20 0 points 0 points  Months in reverse 0 points 0 points  Repeat phrase 0 points 0 points  Total Score 0 -    Immunizations Immunization History  Administered Date(s) Administered    Fluad Quad(high Dose 65+) 08/20/2017, 07/23/2019, 08/01/2020   Influenza Whole 08/24/2008, 08/03/2010   Influenza, High Dose Seasonal PF 07/10/2018   Influenza,inj,quad, With Preservative 10/09/2016   PFIZER(Purple Top)SARS-COV-2 Vaccination 12/27/2019, 01/19/2020, 08/26/2020   Pneumococcal Conjugate-13 08/20/2017   Pneumococcal Polysaccharide-23 07/23/2019   Tdap 02/12/2017    TDAP status: Up to date  Flu Vaccine status: Due, Education has been provided regarding the importance of this vaccine. Advised may receive this vaccine at local pharmacy or Health Dept. Aware to provide a copy of the vaccination record if obtained from local pharmacy or Health Dept. Verbalized acceptance and understanding.  Pneumococcal vaccine status: Up to date  Covid-19 vaccine status: Completed vaccines  Qualifies for Shingles Vaccine? Yes   Zostavax completed No   Shingrix Completed?: No.    Education has been provided regarding the importance of this vaccine. Patient has been advised to call insurance company to determine out of pocket expense if they have not yet received this vaccine. Advised may also receive vaccine at local pharmacy or Health Dept. Verbalized acceptance and understanding.  Screening Tests Health Maintenance  Topic Date Due   Zoster Vaccines- Shingrix (1 of 2) Never done   COVID-19 Vaccine (4 - Booster for Pfizer series) 11/26/2020   INFLUENZA VACCINE  06/12/2021   MAMMOGRAM  01/11/2022   TETANUS/TDAP  02/13/2027   COLONOSCOPY (Pts 45-88yr Insurance coverage will need to be confirmed)  09/01/2029   DEXA SCAN  Completed   Hepatitis C Screening  Completed   PNA vac Low Risk Adult  Completed   HPV VACCINES  Aged Out    Health Maintenance  Health Maintenance Due  Topic Date Due   Zoster Vaccines- Shingrix (1 of 2) Never done   COVID-19 Vaccine (4 - Booster for Pfizer series) 11/26/2020   INFLUENZA VACCINE  06/12/2021    Colorectal cancer screening: Type of screening:  Colonoscopy. Completed 09/02/19. Repeat every 10 years  Mammogram status: Completed 01/12/20. Repeat every year  Bone Density  status: Completed 01/12/20. Results reflect: Bone density results: OSTEOPENIA. Repeat every 2 years.  Additional Screening:  Hepatitis C Screening: Completed 07/23/19  Vision Screening: Recommended annual ophthalmology exams for early detection of glaucoma and other disorders of the eye. Is the patient up to date with their annual eye exam?  Yes  Who is the provider or what is the name of the office in which the patient attends annual eye exams? [facility] If pt is not established with a provider, would they like to be referred to a provider to establish care? No .   Dental Screening: Recommended annual dental exams for proper oral hygiene  Community Resource Referral / Chronic Care Management: CRR required this visit?  No   CCM required this visit?  No      Plan:     I have personally reviewed and noted the following in the patient's chart:   Medical and social history Use of alcohol, tobacco or illicit drugs  Current medications and supplements including opioid prescriptions.  Functional ability and status Nutritional status Physical activity Advanced directives List of other physicians Hospitalizations, surgeries, and ER visits in previous 12 months Vitals Screenings to include cognitive, depression, and falls Referrals and appointments  In addition, I have reviewed and discussed with patient certain preventive protocols, quality metrics, and best practice recommendations. A written personalized care plan for preventive services as well as general preventive health recommendations were provided to patient.     Willette Brace, LPN   [id number]   Nurse Notes: Pt stated that she submitted a request for a refill via my chart for Triamcinolone cream and has not been refilled please advise

## 2021-06-28 NOTE — Patient Instructions (Signed)
[name] , Thank you for taking time to come for your Medicare Wellness Visit. I appreciate your ongoing commitment to your health goals. Please review the following plan we discussed and let me know if I can assist you in the future.   Screening recommendations/referrals: Colonoscopy: Done 09/02/19 repeat every 10 years due 09/01/29 Mammogram: Done 01/12/20 repeat very year Bone Density: Done 01/12/20 repeat every 2 years  Recommended yearly ophthalmology/optometry visit for glaucoma screening and checkup Recommended yearly dental visit for hygiene and checkup  Vaccinations: Influenza vaccine: Due Pneumococcal vaccine: Completed  Tdap vaccine: 02/12/17 repeat every 10 years Due 02/13/27   Shingles vaccine: Shingrix discussed. Please contact your pharmacy for coverage information.    Covid-19:Completed 2/14, 3/9, & 08/26/20  Advanced directives: Advance directive discussed with you today. I have provided a copy for you to complete at home and have notarized. Once this is complete please bring a copy in to our office so we can scan it into your chart.  Conditions/risks identified: Get shoulder better  Next appointment: Follow up in one year for your annual wellness visit    Preventive Care 65 Years and Older, Female Preventive care refers to lifestyle choices and visits with your health care provider that can promote health and wellness. What does preventive care include? A yearly physical exam. This is also called an annual well check. Dental exams once or twice a year. Routine eye exams. Ask your health care provider how often you should have your eyes checked. Personal lifestyle choices, including: Daily care of your teeth and gums. Regular physical activity. Eating a healthy diet. Avoiding tobacco and drug use. Limiting alcohol use. Practicing safe sex. Taking low-dose aspirin every day. Taking vitamin and mineral supplements as recommended by your health care provider. What  happens during an annual well check? The services and screenings done by your health care provider during your annual well check will depend on your age, overall health, lifestyle risk factors, and family history of disease. Counseling  Your health care provider may ask you questions about your: Alcohol use. Tobacco use. Drug use. Emotional well-being. Home and relationship well-being. Sexual activity. Eating habits. History of falls. Memory and ability to understand (cognition). Work and work Statistician. Reproductive health. Screening  You may have the following tests or measurements: Height, weight, and BMI. Blood pressure. Lipid and cholesterol levels. These may be checked every 5 years, or more frequently if you are over 74 years old. Skin check. Lung cancer screening. You may have this screening every year starting at age 93 if you have a 30-pack-year history of smoking and currently smoke or have quit within the past 15 years. Fecal occult blood test (FOBT) of the stool. You may have this test every year starting at age 21. Flexible sigmoidoscopy or colonoscopy. You may have a sigmoidoscopy every 5 years or a colonoscopy every 10 years starting at age 82. Hepatitis C blood test. Hepatitis B blood test. Sexually transmitted disease (STD) testing. Diabetes screening. This is done by checking your blood sugar (glucose) after you have not eaten for a while (fasting). You may have this done every 1-3 years. Bone density scan. This is done to screen for osteoporosis. You may have this done starting at age 44. Mammogram. This may be done every 1-2 years. Talk to your health care provider about how often you should have regular mammograms. Talk with your health care provider about your test results, treatment options, and if necessary, the need for more tests.  Vaccines  Your health care provider may recommend certain vaccines, such as: Influenza vaccine. This is recommended every  year. Tetanus, diphtheria, and acellular pertussis (Tdap, Td) vaccine. You may need a Td booster every 10 years. Zoster vaccine. You may need this after age 16. Pneumococcal 13-valent conjugate (PCV13) vaccine. One dose is recommended after age 75. Pneumococcal polysaccharide (PPSV23) vaccine. One dose is recommended after age 18. Talk to your health care provider about which screenings and vaccines you need and how often you need them. This information is not intended to replace advice given to you by your health care provider. Make sure you discuss any questions you have with your health care provider. Document Released: 11/25/2015 Document Revised: 07/18/2016 Document Reviewed: 08/30/2015 Elsevier Interactive Patient Education  2017 [employer] Prevention in the Home Falls can cause injuries. They can happen to people of all ages. There are many things you can do to make your home safe and to help prevent falls. What can I do on the outside of my home? Regularly fix the edges of walkways and driveways and fix any cracks. Remove anything that might make you trip as you walk through a door, such as a raised step or threshold. Trim any bushes or trees on the path to your home. Use bright outdoor lighting. Clear any walking paths of anything that might make someone trip, such as rocks or tools. Regularly check to see if handrails are loose or broken. Make sure that both sides of any steps have handrails. Any raised decks and porches should have guardrails on the edges. Have any leaves, snow, or ice cleared regularly. Use sand or salt on walking paths during winter. Clean up any spills in your garage right away. This includes oil or grease spills. What can I do in the bathroom? Use night lights. Install grab bars by the toilet and in the tub and shower. Do not use towel bars as grab bars. Use non-skid mats or decals in the tub or shower. If you need to sit down in the shower, use a  plastic, non-slip stool. Keep the floor dry. Clean up any water that spills on the floor as soon as it happens. Remove soap buildup in the tub or shower regularly. Attach bath mats securely with double-sided non-slip rug tape. Do not have throw rugs and other things on the floor that can make you trip. What can I do in the bedroom? Use night lights. Make sure that you have a light by your bed that is easy to reach. Do not use any sheets or blankets that are too big for your bed. They should not hang down onto the floor. Have a firm chair that has side arms. You can use this for support while you get dressed. Do not have throw rugs and other things on the floor that can make you trip. What can I do in the kitchen? Clean up any spills right away. Avoid walking on wet floors. Keep items that you use a lot in easy-to-reach places. If you need to reach something above you, use a strong step stool that has a grab bar. Keep electrical cords out of the way. Do not use floor polish or wax that makes floors slippery. If you must use wax, use non-skid floor wax. Do not have throw rugs and other things on the floor that can make you trip. What can I do with my stairs? Do not leave any items on the stairs. Make sure that  there are handrails on both sides of the stairs and use them. Fix handrails that are broken or loose. Make sure that handrails are as long as the stairways. Check any carpeting to make sure that it is firmly attached to the stairs. Fix any carpet that is loose or worn. Avoid having throw rugs at the top or bottom of the stairs. If you do have throw rugs, attach them to the floor with carpet tape. Make sure that you have a light switch at the top of the stairs and the bottom of the stairs. If you do not have them, ask someone to add them for you. What else can I do to help prevent falls? Wear shoes that: Do not have high heels. Have rubber bottoms. Are comfortable and fit you  well. Are closed at the toe. Do not wear sandals. If you use a stepladder: Make sure that it is fully opened. Do not climb a closed stepladder. Make sure that both sides of the stepladder are locked into place. Ask someone to hold it for you, if possible. Clearly mark and make sure that you can see: Any grab bars or handrails. First and last steps. Where the edge of each step is. Use tools that help you move around (mobility aids) if they are needed. These include: Canes. Walkers. Scooters. Crutches. Turn on the lights when you go into a dark area. Replace any light bulbs as soon as they burn out. Set up your furniture so you have a clear path. Avoid moving your furniture around. If any of your floors are uneven, fix them. If there are any pets around you, be aware of where they are. Review your medicines with your doctor. Some medicines can make you feel dizzy. This can increase your chance of falling. Ask your doctor what other things that you can do to help prevent falls. This information is not intended to replace advice given to you by your health care provider. Make sure you discuss any questions you have with your health care provider. Document Released: 08/25/2009 Document Revised: 04/05/2016 Document Reviewed: 12/03/2014 Elsevier Interactive Patient Education  2017 [employer].

## 2021-06-29 ENCOUNTER — Ambulatory Visit: Payer: Medicare Other | Admitting: Family Medicine

## 2021-07-04 ENCOUNTER — Encounter: Payer: Self-pay | Admitting: Orthopedic Surgery

## 2021-07-05 ENCOUNTER — Encounter: Payer: Self-pay | Admitting: Orthopedic Surgery

## 2021-07-05 MED ORDER — TRAMADOL HCL 50 MG PO TABS: 50.0000 mg | ORAL_TABLET | Freq: Two times a day (BID) | ORAL | 0 refills | Status: DC | PRN

## 2021-07-05 NOTE — Progress Notes (Signed)
Office Visit Note   Patient: [name]           Date of Birth: 01-07-1951           MRN: [id number] Visit Date: 06/28/2021 Requested by: Billie Ruddy, MD [place],  [place] [id number] PCP: Billie Ruddy, MD  Subjective: Chief Complaint  Patient presents with   Other    Left shoulder/neck/arm pain    HPI: [name] is a 70 year old patient with left shoulder arm and neck pain.  She was cleaning the pantry 8-22 and lifting bags into a heavy trash can.  The next day she reported decreased range of motion in the neck as well as some numbness and tingling in her index finger which started several days thereafter.  She does describe radiating arm pain with numbness and tingling as well as biceps pain.  She has had 2 massages without relief.  The pain does wake her from sleep at night.  She has tried Biofreeze with some relief.  Tried ibuprofen 3 times a day without relief.  She does have Flexeril already at home.  Also has a known history of spinal stenosis in the lumbar spine.              ROS: All systems reviewed are negative as they relate to the chief complaint within the history of present illness.  Patient denies  fevers or chills.   Assessment & Plan: Visit Diagnoses:  1. Left arm pain     Plan: Impression is radicular arm pain.  Exam and radiographs of the shoulder on the left are underwhelming in terms of intrinsic shoulder pathology.  Plan is Medrol Dosepak 6-day course and continue Flexeril.  3-week return to decide for or against MRI scan.  Continue with self-directed range of motion exercises avoiding heavy lifting.  Follow-Up Instructions: No follow-ups on file.   Orders:  Orders Placed This Encounter  Procedures   XR Shoulder Left   XR Cervical Spine 2 or 3 views   Meds ordered this encounter  Medications   methylPREDNISolone (MEDROL DOSEPAK) 4 MG TBPK tablet    Sig: Take dosepak as directed    Dispense:  21 tablet    Refill:  0       Procedures: No procedures performed   Clinical Data: No additional findings.  Objective: Vital Signs: There were no vitals taken for this visit.  Physical Exam:   Constitutional: Patient appears well-developed HEENT:  Head: Normocephalic Eyes:EOM are normal Neck: Normal range of motion Cardiovascular: Normal rate Pulmonary/chest: Effort normal Neurologic: Patient is alert Skin: Skin is warm Psychiatric: Patient has normal mood and affect   Ortho Exam: Ortho exam demonstrates 5 out of 5 grip EPL FPL interosseous wrist flexion that she has a box of triceps and deltoid strength.  Radial pulse intact bilaterally.  Mild paresthesias C6 on the left compared to the right.  No muscle atrophy left arm versus right arm.  Shoulder exam demonstrates symmetric infraspinatus supraspinatus subscap strength bilaterally.  Not too much coarse grinding or crepitus with passive or active range of motion of that left shoulder and no restriction of passive range of motion present.  Specialty Comments:  No specialty comments available.  Imaging: No results found.   PMFS History: Patient Active Problem List   Diagnosis Date Noted   ABDOMINAL PAIN 05/01/2010   DIVERTICULOSIS, COLON 07/26/2008   HEADACHE 07/26/2008   DEPRESSION 07/21/2007   Past Medical History:  Diagnosis Date  ABDOMINAL PAIN 05/01/2010   DEPRESSION 07/21/2007   DIVERTICULOSIS, COLON 07/26/2008   Headache(784.0) 07/26/2008   Pityriasis 09/13/2019    Family History  Problem Relation Age of Onset   Diabetes Mother    Heart disease Mother    Obesity Mother    Dementia Mother    Heart disease Father    Stroke Father    Colon cancer Paternal Aunt    Other Paternal Aunt        esophageal stricture   Esophageal cancer Neg Hx    Rectal cancer Neg Hx    Stomach cancer Neg Hx     Past Surgical History:  Procedure Laterality Date   COLONOSCOPY     ERCP  august 2012   LAPAROSCOPIC CHOLECYSTECTOMY  08/14/2011    WISDOM TOOTH EXTRACTION     Social History   Occupational History   Not on file  Tobacco Use   Smoking status: Never   Smokeless tobacco: Never  Vaping Use   Vaping Use: Never used  Substance and Sexual Activity   Alcohol use: No   Drug use: No   Sexual activity: Not on file

## 2021-07-19 ENCOUNTER — Ambulatory Visit (INDEPENDENT_AMBULATORY_CARE_PROVIDER_SITE_OTHER): Payer: Medicare Other | Admitting: Orthopedic Surgery

## 2021-07-19 ENCOUNTER — Other Ambulatory Visit: Payer: Self-pay

## 2021-07-19 DIAGNOSIS — M79602 Pain in left arm: Secondary | ICD-10-CM

## 2021-07-22 ENCOUNTER — Other Ambulatory Visit: Payer: Self-pay

## 2021-07-22 ENCOUNTER — Ambulatory Visit (INDEPENDENT_AMBULATORY_CARE_PROVIDER_SITE_OTHER): Payer: Medicare Other

## 2021-07-22 DIAGNOSIS — R202 Paresthesia of skin: Secondary | ICD-10-CM

## 2021-07-22 DIAGNOSIS — M79602 Pain in left arm: Secondary | ICD-10-CM

## 2021-07-22 DIAGNOSIS — M47812 Spondylosis without myelopathy or radiculopathy, cervical region: Secondary | ICD-10-CM | POA: Diagnosis not present

## 2021-07-22 DIAGNOSIS — M4312 Spondylolisthesis, cervical region: Secondary | ICD-10-CM | POA: Diagnosis not present

## 2021-07-22 DIAGNOSIS — M4802 Spinal stenosis, cervical region: Secondary | ICD-10-CM | POA: Diagnosis not present

## 2021-07-23 ENCOUNTER — Encounter: Payer: Self-pay | Admitting: Orthopedic Surgery

## 2021-07-23 NOTE — Progress Notes (Signed)
Office Visit Note   Patient: [name]           Date of Birth: [birth year]           MRN: [id number] Visit Date: 07/19/2021 Requested by: Billie Ruddy, MD [place],  [place] [id number] PCP: Billie Ruddy, MD  Subjective: Chief Complaint  Patient presents with   Left Shoulder - Follow-up   Neck - Follow-up    HPI: [name] is a 70 y.o. female who returns to the office for follow-up visit.  Plan from last visit was to try Medrol Dosepak with follow-up visit.  Patient now returns with significant improvement compared with prior appointment.  Reports she has pretty much 100% relief of the pain she is experiencing but she does complain of continued numbness and tingling that is pretty much constant in the index, long, ring, small fingers of her left hand.  She is right-handed.  Pain does not wake her up at night any longer..  She has not noticed any weakness in her arm.  She has been trying home exercise for her neck as well in the last several weeks.              ROS: All systems reviewed are negative as they relate to the chief complaint within the history of present illness.  Patient denies fevers or chills.  Assessment & Plan: Visit Diagnoses:  1. Left arm pain     Plan: [name] is a 70 y.o. female who returns to the office for follow-up visit for left shoulder and neck pain.  Plan from last visit was Medrol Dosepak.  They now return with significant improvement in pain but she has constant numbness and tingling in for the 5 fingers in her left hand and triceps weakness  of the left arm.  Her weakness is significant.  Plan to order MRI of the cervical spine for further evaluation and follow-up after to review results.  Patient agreed with plan.  Follow-Up Instructions: No follow-ups on file.   Orders:  Orders Placed This Encounter  Procedures   MR Cervical Spine w/o contrast   No orders of the defined types were placed in this  encounter.     Procedures: No procedures performed   Clinical Data: No additional findings.  Objective: Vital Signs: There were no vitals taken for this visit.  Physical Exam:  Constitutional: Patient appears well-developed HEENT:  Head: Normocephalic Eyes:EOM are normal Neck: Normal range of motion Cardiovascular: Normal rate Pulmonary/chest: Effort normal Neurologic: Patient is alert Skin: Skin is warm Psychiatric: Patient has normal mood and affect  Ortho Exam: Ortho exam demonstrates axial cervical spine with some tenderness in the inferior aspect of the cervical spine.  Positive Spurling sign reproducing radicular pain down to her left fingers.  Negative Hoffmann sign bilaterally.  5/5 motor strength of bilateral grip strength, finger abduction, pronation/supination, bicep, deltoid.  She does have 3/5 motor strength of her tricep of the left arm compared with the right.  She has excellent strength rated 5/5 of the right tricep but her left is grossly weak to the point where she cannot really give any resistance except against gravity.  She has not noticed this until the exam today but is taken aback at how weak she is.  Pain with cervical spine range of motion particularly with looking up.  45 degrees external rotation, 90 degrees abduction, 150 degrees forward flexion of the left shoulder.  Excellent  rotator cuff strength rated 5/5 of supra, infra, subscap.  Specialty Comments:  No specialty comments available.  Imaging: No results found.   PMFS History: Patient Active Problem List   Diagnosis Date Noted   ABDOMINAL PAIN 05/01/2010   DIVERTICULOSIS, COLON 07/26/2008   HEADACHE 07/26/2008   DEPRESSION 07/21/2007   Past Medical History:  Diagnosis Date   ABDOMINAL PAIN 05/01/2010   DEPRESSION 07/21/2007   DIVERTICULOSIS, COLON 07/26/2008   Headache(784.0) 07/26/2008   Pityriasis 09/13/2019    Family History  Problem Relation Age of Onset   Diabetes Mother    Heart  disease Mother    Obesity Mother    Dementia Mother    Heart disease Father    Stroke Father    Colon cancer Paternal Aunt    Other Paternal Aunt        esophageal stricture   Esophageal cancer Neg Hx    Rectal cancer Neg Hx    Stomach cancer Neg Hx     Past Surgical History:  Procedure Laterality Date   COLONOSCOPY     ERCP  august 2012   LAPAROSCOPIC CHOLECYSTECTOMY  08/14/2011   WISDOM TOOTH EXTRACTION     Social History   Occupational History   Not on file  Tobacco Use   Smoking status: Never   Smokeless tobacco: Never  Vaping Use   Vaping Use: Never used  Substance and Sexual Activity   Alcohol use: No   Drug use: No   Sexual activity: Not on file

## 2021-07-24 ENCOUNTER — Telehealth: Payer: Self-pay | Admitting: Physical Medicine and Rehabilitation

## 2021-07-24 DIAGNOSIS — M5416 Radiculopathy, lumbar region: Secondary | ICD-10-CM

## 2021-07-24 NOTE — Telephone Encounter (Signed)
Bilateral L4 TF on 05/22/21. Ok to repeat if helped, same problem/side, and no new injury?

## 2021-07-24 NOTE — Telephone Encounter (Signed)
Pt called stating she needs an appt for an inj she gets every 3 months.   [phone]

## 2021-07-25 ENCOUNTER — Telehealth: Payer: Self-pay | Admitting: Physical Medicine and Rehabilitation

## 2021-07-25 DIAGNOSIS — Z23 Encounter for immunization: Secondary | ICD-10-CM | POA: Diagnosis not present

## 2021-07-25 NOTE — Telephone Encounter (Signed)
Scheduled for 10/11

## 2021-07-25 NOTE — Telephone Encounter (Signed)
Left message #1

## 2021-07-25 NOTE — Telephone Encounter (Signed)
See previous message

## 2021-07-25 NOTE — Telephone Encounter (Signed)
Patient called. She would like Courtney to call her. [phone]

## 2021-07-27 ENCOUNTER — Encounter: Payer: Self-pay | Admitting: Physical Medicine and Rehabilitation

## 2021-07-27 DIAGNOSIS — M48062 Spinal stenosis, lumbar region with neurogenic claudication: Secondary | ICD-10-CM

## 2021-07-27 DIAGNOSIS — M4316 Spondylolisthesis, lumbar region: Secondary | ICD-10-CM

## 2021-07-27 DIAGNOSIS — M5416 Radiculopathy, lumbar region: Secondary | ICD-10-CM

## 2021-08-02 ENCOUNTER — Encounter: Payer: Self-pay | Admitting: Orthopedic Surgery

## 2021-08-02 ENCOUNTER — Ambulatory Visit (INDEPENDENT_AMBULATORY_CARE_PROVIDER_SITE_OTHER): Payer: Medicare Other | Admitting: Orthopedic Surgery

## 2021-08-02 ENCOUNTER — Other Ambulatory Visit: Payer: Self-pay

## 2021-08-02 DIAGNOSIS — M5416 Radiculopathy, lumbar region: Secondary | ICD-10-CM

## 2021-08-02 DIAGNOSIS — R29898 Other symptoms and signs involving the musculoskeletal system: Secondary | ICD-10-CM

## 2021-08-04 ENCOUNTER — Telehealth: Payer: Self-pay | Admitting: Physical Medicine and Rehabilitation

## 2021-08-04 NOTE — Telephone Encounter (Signed)
Pt returned call. Please call back   [phone]

## 2021-08-05 ENCOUNTER — Encounter: Payer: Self-pay | Admitting: Orthopedic Surgery

## 2021-08-05 NOTE — Progress Notes (Signed)
Office Visit Note   Patient: [name]           Date of Birth: August 08, 1951           MRN: [id number] Visit Date: 08/02/2021 Requested by: Billie Ruddy, MD [place],  [place] [address] PCP: Billie Ruddy, MD  Subjective: Chief Complaint  Patient presents with   Neck - Follow-up    HPI: [name] is a 70 y.o. female who presents to the office for MRI review. Patient denies any changes in symptoms.  Continues to complain mainly of numbness and tingling in 3 fingers in her left hand.  Radicular pain has resolved similarly to last appointment.  MRI results revealed: MR Cervical Spine w/o contrast  Result Date: 07/25/2021 CLINICAL DATA:  Left arm pain. Tingling in the left hand after lifting heavy object EXAM: MRI CERVICAL SPINE WITHOUT CONTRAST TECHNIQUE: Multiplanar, multisequence MR imaging of the cervical spine was performed. No intravenous contrast was administered. COMPARISON:  None. FINDINGS: Alignment: 2 mm of anterolisthesis at C4-5 Vertebrae: No fracture, evidence of discitis, or bone lesion. Cord: Normal signal and morphology. Posterior Fossa, vertebral arteries, paraspinal tissues: Negative. Disc levels: C2-3: Mild left facet spurring C3-4: Mild left facet spurring with moderate left foraminal narrowing C4-5: Disc narrowing with endplate and uncovertebral ridging. Facet osteoarthritis with spurring and anterolisthesis. Biforaminal impingement and mild spinal stenosis C5-6: Disc narrowing and bulging with bilateral uncovertebral ridging worse on the left. Bilateral foraminal impingement, severe on the left. Mild spinal stenosis C6-7: Disc narrowing and bulging with left foraminal impingement from spur and bulge. Mild spinal stenosis. C7-T1:Unremarkable IMPRESSION: 1. 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 2. Foraminal impingement on the left at C3-4 to C6-7 (especially at C4-5 and C5-6) and on the right at C4-5 and C5-6. Electronically  Signed   By: Jorje Guild M.D.   On: 07/25/2021 10:28                 ROS: All systems reviewed are negative as they relate to the chief complaint within the history of present illness.  Patient denies fevers or chills.  Assessment & Plan: Visit Diagnoses:  1. Radiculopathy, lumbar region     Plan: [name] is a 70 y.o. female who presents to the office for evaluation of cervical spine MRI.  She has had persistent numbness and tingling in 3 fingers in her left hand (index, long, ring fingers).  More concerning is the marked weakness of her tricep of the left arm on exam which has remained weak compared with previous examination.  She does not notice this in her daily life and she is able to extend her elbow against gravity.  She has had referral to talk with neurosurgeon about surgical intervention with her lumbar spine pathology.  That is more distressing for her than her arm symptoms but plan to refer her to Dr. Ernestina Patches for cervical spine ESI and if she has incomplete resolution of symptoms after that, consider discussing with Dr. Ronnald Ramp about her cervical spine MRI results.  Follow-Up Instructions: No follow-ups on file.   Orders:  Orders Placed This Encounter  Procedures   Ambulatory referral to Physical Medicine Rehab   No orders of the defined types were placed in this encounter.     Procedures: No procedures performed   Clinical Data: No additional findings.  Objective: Vital Signs: There were no vitals taken for this visit.  Physical Exam:  Constitutional: Patient appears well-developed HEENT:  Head: Normocephalic Eyes:EOM are normal Neck: Normal range of motion Cardiovascular: Normal rate Pulmonary/chest: Effort normal Neurologic: Patient is alert Skin: Skin is warm Psychiatric: Patient has normal mood and affect  Ortho Exam: Ortho exam demonstrates 5/5 motor strength of bilateral grip strength, finger abduction, pronation/supination, bicep, deltoid.   4/5 motor strength of left tricep relative to 5/5 motor strength of right tricep.  2+ bicep tendon reflexes bilaterally.  2+ tricep tendon reflex on the right with absent tricep tendon reflex on left.  Negative Hoffmann sign bilaterally.  Mild tenderness throughout the inferior axial cervical spine.  Negative Lhermitte sign.  Negative Spurling sign.  Specialty Comments:  No specialty comments available.  Imaging: No results found.   PMFS History: Patient Active Problem List   Diagnosis Date Noted   ABDOMINAL PAIN 05/01/2010   DIVERTICULOSIS, COLON 07/26/2008   HEADACHE 07/26/2008   DEPRESSION 07/21/2007   Past Medical History:  Diagnosis Date   ABDOMINAL PAIN 05/01/2010   DEPRESSION 07/21/2007   DIVERTICULOSIS, COLON 07/26/2008   Headache(784.0) 07/26/2008   Pityriasis 09/13/2019    Family History  Problem Relation Age of Onset   Diabetes Mother    Heart disease Mother    Obesity Mother    Dementia Mother    Heart disease Father    Stroke Father    Colon cancer Paternal Aunt    Other Paternal Aunt        esophageal stricture   Esophageal cancer Neg Hx    Rectal cancer Neg Hx    Stomach cancer Neg Hx     Past Surgical History:  Procedure Laterality Date   COLONOSCOPY     ERCP  august 2012   LAPAROSCOPIC CHOLECYSTECTOMY  08/14/2011   WISDOM TOOTH EXTRACTION     Social History   Occupational History   Not on file  Tobacco Use   Smoking status: Never   Smokeless tobacco: Never  Vaping Use   Vaping Use: Never used  Substance and Sexual Activity   Alcohol use: No   Drug use: No   Sexual activity: Not on file

## 2021-08-07 ENCOUNTER — Telehealth: Payer: Self-pay | Admitting: Physical Medicine and Rehabilitation

## 2021-08-07 NOTE — Telephone Encounter (Signed)
Patient called. She would like a call back from  [facility].

## 2021-08-08 NOTE — Telephone Encounter (Signed)
Order has been revised and faxed to [facility]

## 2021-08-10 ENCOUNTER — Telehealth: Payer: Self-pay | Admitting: Physical Medicine and Rehabilitation

## 2021-08-10 DIAGNOSIS — R030 Elevated blood-pressure reading, without diagnosis of hypertension: Secondary | ICD-10-CM | POA: Diagnosis not present

## 2021-08-10 DIAGNOSIS — M48061 Spinal stenosis, lumbar region without neurogenic claudication: Secondary | ICD-10-CM | POA: Diagnosis not present

## 2021-08-10 DIAGNOSIS — Z6827 Body mass index (BMI) 27.0-27.9, adult: Secondary | ICD-10-CM | POA: Diagnosis not present

## 2021-08-10 DIAGNOSIS — M4316 Spondylolisthesis, lumbar region: Secondary | ICD-10-CM | POA: Diagnosis not present

## 2021-08-10 NOTE — Telephone Encounter (Signed)
Patient called. Need to cxl with Dr Ernestina Patches. Having surgery.

## 2021-08-17 DIAGNOSIS — M545 Low back pain, unspecified: Secondary | ICD-10-CM | POA: Diagnosis not present

## 2021-08-17 DIAGNOSIS — M4316 Spondylolisthesis, lumbar region: Secondary | ICD-10-CM | POA: Diagnosis not present

## 2021-08-22 ENCOUNTER — Ambulatory Visit: Payer: Medicare Other | Admitting: Physical Medicine and Rehabilitation

## 2021-08-24 DIAGNOSIS — M4316 Spondylolisthesis, lumbar region: Secondary | ICD-10-CM | POA: Diagnosis not present

## 2021-08-25 ENCOUNTER — Other Ambulatory Visit: Payer: Self-pay | Admitting: Neurological Surgery

## 2021-08-29 ENCOUNTER — Telehealth: Payer: Self-pay | Admitting: Physical Medicine and Rehabilitation

## 2021-08-29 NOTE — Telephone Encounter (Signed)
Patient called. Returning a call to [place]. Patient says that she is having surgery and will not need the injection.

## 2021-08-30 ENCOUNTER — Ambulatory Visit: Payer: Medicare Other | Admitting: Physician Assistant

## 2021-09-06 ENCOUNTER — Other Ambulatory Visit: Payer: Self-pay

## 2021-09-06 ENCOUNTER — Ambulatory Visit (INDEPENDENT_AMBULATORY_CARE_PROVIDER_SITE_OTHER): Payer: Medicare Other

## 2021-09-06 DIAGNOSIS — Z1231 Encounter for screening mammogram for malignant neoplasm of breast: Secondary | ICD-10-CM

## 2021-09-08 NOTE — Pre-Procedure Instructions (Signed)
Surgical Instructions    Your procedure is scheduled on Wednesday, November 2nd.  Report to [facility] Main Entrance "A" at 06:30 A.M., then check in with the Admitting office.  Call this number if you have problems the morning of surgery:  [phone]   If you have any questions prior to your surgery date call [phone]: Open Monday-Friday 8am-4pm    Remember:  Do not eat or drink after midnight the night before your surgery     Take these medicines the morning of surgery with A SIP OF WATER  acyclovir (ZOVIRAX)- if needed escitalopram (LEXAPRO)  As of today, STOP taking any Aspirin (unless otherwise instructed by your surgeon) Aleve, Naproxen, Ibuprofen, Motrin, Advil, Goody's, BC's, all herbal medications, fish oil, and all vitamins.                     Do NOT Smoke (Tobacco/Vaping) or drink Alcohol 24 hours prior to your procedure.  If you use a CPAP at night, you may bring all equipment for your overnight stay.   Contacts, glasses, piercing's, hearing aid's, dentures or partials may not be worn into surgery, please bring cases for these belongings.    For patients admitted to the hospital, discharge time will be determined by your treatment team.   Patients discharged the day of surgery will not be allowed to drive home, and someone needs to stay with them for 24 hours.  NO VISITORS WILL BE ALLOWED IN PRE-OP WHERE PATIENTS GET READY FOR SURGERY.  ONLY 1 SUPPORT PERSON MAY BE PRESENT IN THE WAITING ROOM WHILE YOU ARE IN SURGERY.  IF YOU ARE TO BE ADMITTED, ONCE YOU ARE IN YOUR ROOM YOU WILL BE ALLOWED TWO (2) VISITORS.  Minor children may have two parents present. Special consideration for safety and communication needs will be reviewed on a case by case basis.   Special instructions:   [place]- Preparing For Surgery  Before surgery, you can play an important role. Because skin is not sterile, your skin needs to be as free of germs as possible. You can reduce the  number of germs on your skin by washing with CHG (chlorahexidine gluconate) Soap before surgery.  CHG is an antiseptic cleaner which kills germs and bonds with the skin to continue killing germs even after washing.    Oral Hygiene is also important to reduce your risk of infection.  Remember - BRUSH YOUR TEETH THE MORNING OF SURGERY WITH YOUR REGULAR TOOTHPASTE  Please do not use if you have an allergy to CHG or antibacterial soaps. If your skin becomes reddened/irritated stop using the CHG.  Do not shave (including legs and underarms) for at least 48 hours prior to first CHG shower. It is OK to shave your face.  Please follow these instructions carefully.   Shower the NIGHT BEFORE SURGERY and the MORNING OF SURGERY  If you chose to wash your hair, wash your hair first as usual with your normal shampoo.  After you shampoo, rinse your hair and body thoroughly to remove the shampoo.  Use CHG Soap as you would any other liquid soap. You can apply CHG directly to the skin and wash gently with a scrungie or a clean washcloth.   Apply the CHG Soap to your body ONLY FROM THE NECK DOWN.  Do not use on open wounds or open sores. Avoid contact with your eyes, ears, mouth and genitals (private parts). Wash Face and genitals (private parts)  with your normal soap.  Wash thoroughly, paying special attention to the area where your surgery will be performed.  Thoroughly rinse your body with warm water from the neck down.  DO NOT shower/wash with your normal soap after using and rinsing off the CHG Soap.  Pat yourself dry with a CLEAN TOWEL.  Wear CLEAN PAJAMAS to bed the night before surgery  Place CLEAN SHEETS on your bed the night before your surgery  DO NOT SLEEP WITH PETS.   Day of Surgery: Shower with CHG soap. Do not wear jewelry, make up, nail polish, gel polish, artificial nails, or any other type of covering on natural nails including finger and toenails. If patients have artificial  nails, gel coating, etc. that need to be removed by a nail salon please have this removed prior to surgery. Surgery may need to be canceled/delayed if the surgeon/ anesthesia feels like the patient is unable to be adequately monitored. Do not wear lotions, powders, perfumes, or deodorant. Do not shave 48 hours prior to surgery.   Do not bring valuables to the hospital. [facility] is not responsible for any belongings or valuables. Wear Clean/Comfortable clothing the morning of surgery Remember to brush your teeth WITH YOUR REGULAR TOOTHPASTE.   Please read over the following fact sheets that you were given.   3 days prior to your procedure or After your COVID test   You are not required to quarantine however you are required to wear a well-fitting mask when you are out and around people not in your household. If your mask becomes wet or soiled, replace with a new one.   Wash your hands often with soap and water for 20 seconds or clean your hands with an alcohol-based hand sanitizer that contains at least 60% alcohol.   Do not share personal items.   Notify your provider:  o if you are in close contact with someone who has COVID  o or if you develop a fever of 100.4 or greater, sneezing, cough, sore throat, shortness of breath or body aches.

## 2021-09-11 ENCOUNTER — Encounter (HOSPITAL_COMMUNITY): Payer: Self-pay

## 2021-09-11 ENCOUNTER — Other Ambulatory Visit: Payer: Self-pay

## 2021-09-11 ENCOUNTER — Encounter (HOSPITAL_COMMUNITY)
Admission: RE | Admit: 2021-09-11 | Discharge: 2021-09-11 | Disposition: A | Discharge status: Home / Self Care | Payer: Medicare Other | Source: Ambulatory Visit | Attending: Neurological Surgery | Admitting: Neurological Surgery

## 2021-09-11 VITALS — BP 135/83 | HR 92 | Temp 97.9°F | Resp 18 | Ht 65.0 in | Wt 164.4 lb

## 2021-09-11 DIAGNOSIS — Z20822 Contact with and (suspected) exposure to covid-19: Secondary | ICD-10-CM | POA: Diagnosis not present

## 2021-09-11 DIAGNOSIS — Z01818 Encounter for other preprocedural examination: Secondary | ICD-10-CM

## 2021-09-11 DIAGNOSIS — M4316 Spondylolisthesis, lumbar region: Secondary | ICD-10-CM | POA: Insufficient documentation

## 2021-09-11 DIAGNOSIS — Z79899 Other long term (current) drug therapy: Secondary | ICD-10-CM | POA: Diagnosis not present

## 2021-09-11 DIAGNOSIS — Z01812 Encounter for preprocedural laboratory examination: Secondary | ICD-10-CM | POA: Insufficient documentation

## 2021-09-11 LAB — CBC
HCT: 42.6 % (ref 36.0–46.0)
Hemoglobin: 14.2 g/dL (ref 12.0–15.0)
MCH: 30.3 pg (ref 26.0–34.0)
MCHC: 33.3 g/dL (ref 30.0–36.0)
MCV: 91 fL (ref 80.0–100.0)
Platelets: 268 10*3/uL (ref 150–400)
RBC: 4.68 MIL/uL (ref 3.87–5.11)
RDW: 11.9 % (ref 11.5–15.5)
WBC: 4.9 10*3/uL (ref 4.0–10.5)
nRBC: 0 % (ref 0.0–0.2)

## 2021-09-11 LAB — PROTIME-INR
INR: 0.9 (ref 0.8–1.2)
Prothrombin Time: 12 seconds (ref 11.4–15.2)

## 2021-09-11 LAB — TYPE AND SCREEN
ABO/RH(D): A POS
Antibody Screen: NEGATIVE

## 2021-09-11 LAB — SURGICAL PCR SCREEN
MRSA, PCR: NEGATIVE
Staphylococcus aureus: NEGATIVE

## 2021-09-11 LAB — SARS CORONAVIRUS 2 (TAT 6-24 HRS): SARS Coronavirus 2: NEGATIVE

## 2021-09-11 NOTE — Progress Notes (Signed)
PCP - Grier Mitts, MD Cardiologist - denies  PPM/ICD - denies Device Orders - n/a Rep Notified - n/a  Chest x-ray - n/a EKG - n/a Stress Test - denies ECHO - denies Cardiac Cath - denies  Sleep Study - denies CPAP - n/a  Fasting Blood Sugar - n/a  Blood Thinner Instructions: n/a  Aspirin Instructions: Patient was instructed: As of today, STOP taking any Aspirin (unless otherwise instructed by your surgeon) Aleve, Naproxen, Ibuprofen, Motrin, Advil, Goody's, BC's, all herbal medications, fish oil, and all vitamins.  ERAS Protcol - no   COVID TEST- yes, done in PAT on 09/11/2021   Anesthesia review: no  Patient denies shortness of breath, fever, cough and chest pain at PAT appointment   All instructions explained to the patient, with a verbal understanding of the material. Patient agrees to go over the instructions while at home for a better understanding. Patient also instructed to self quarantine after being tested for COVID-19. The opportunity to ask questions was provided.

## 2021-09-11 NOTE — Pre-Procedure Instructions (Signed)
Surgical Instructions    Your procedure is scheduled on Wednesday, November 2nd.  Report to [facility] Main Entrance "A" at 06:30 A.M., then check in with the Admitting office.  Call this number if you have problems the morning of surgery:  [phone]   If you have any questions prior to your surgery date call [phone]: Open Monday-Friday 8am-4pm    Remember:  Do not eat or drink after midnight the night before your surgery     Take these medicines the morning of surgery with A SIP OF WATER  acyclovir (ZOVIRAX)- if needed escitalopram (LEXAPRO) Flexeril  As of today, STOP taking any Aspirin (unless otherwise instructed by your surgeon) Aleve, Naproxen, Ibuprofen, Motrin, Advil, Goody's, BC's, all herbal medications, fish oil, and all vitamins.                     Do NOT Smoke (Tobacco/Vaping) or drink Alcohol 24 hours prior to your procedure.  If you use a CPAP at night, you may bring all equipment for your overnight stay.   Contacts, glasses, piercing's, hearing aid's, dentures or partials may not be worn into surgery, please bring cases for these belongings.    For patients admitted to the hospital, discharge time will be determined by your treatment team.   Patients discharged the day of surgery will not be allowed to drive home, and someone needs to stay with them for 24 hours.  NO VISITORS WILL BE ALLOWED IN PRE-OP WHERE PATIENTS GET READY FOR SURGERY.  ONLY 1 SUPPORT PERSON MAY BE PRESENT IN THE WAITING ROOM WHILE YOU ARE IN SURGERY.  IF YOU ARE TO BE ADMITTED, ONCE YOU ARE IN YOUR ROOM YOU WILL BE ALLOWED TWO (2) VISITORS.  Minor children may have two parents present. Special consideration for safety and communication needs will be reviewed on a case by case basis.   Special instructions:   [place]- Preparing For Surgery  Before surgery, you can play an important role. Because skin is not sterile, your skin needs to be as free of germs as possible. You can  reduce the number of germs on your skin by washing with CHG (chlorahexidine gluconate) Soap before surgery.  CHG is an antiseptic cleaner which kills germs and bonds with the skin to continue killing germs even after washing.    Oral Hygiene is also important to reduce your risk of infection.  Remember - BRUSH YOUR TEETH THE MORNING OF SURGERY WITH YOUR REGULAR TOOTHPASTE  Please do not use if you have an allergy to CHG or antibacterial soaps. If your skin becomes reddened/irritated stop using the CHG.  Do not shave (including legs and underarms) for at least 48 hours prior to first CHG shower. It is OK to shave your face.  Please follow these instructions carefully.   Shower the NIGHT BEFORE SURGERY and the MORNING OF SURGERY  If you chose to wash your hair, wash your hair first as usual with your normal shampoo.  After you shampoo, rinse your hair and body thoroughly to remove the shampoo.  Use CHG Soap as you would any other liquid soap. You can apply CHG directly to the skin and wash gently with a scrungie or a clean washcloth.   Apply the CHG Soap to your body ONLY FROM THE NECK DOWN.  Do not use on open wounds or open sores. Avoid contact with your eyes, ears, mouth and genitals (private parts). Wash Face and genitals (private parts)  with your normal  soap.   Wash thoroughly, paying special attention to the area where your surgery will be performed.  Thoroughly rinse your body with warm water from the neck down.  DO NOT shower/wash with your normal soap after using and rinsing off the CHG Soap.  Pat yourself dry with a CLEAN TOWEL.  Wear CLEAN PAJAMAS to bed the night before surgery  Place CLEAN SHEETS on your bed the night before your surgery  DO NOT SLEEP WITH PETS.   Day of Surgery: Shower with CHG soap. Do not wear jewelry, make up, nail polish, gel polish, artificial nails, or any other type of covering on natural nails including finger and toenails. If patients have  artificial nails, gel coating, etc. that need to be removed by a nail salon please have this removed prior to surgery. Surgery may need to be canceled/delayed if the surgeon/ anesthesia feels like the patient is unable to be adequately monitored. Do not wear lotions, powders, perfumes, or deodorant. Do not shave 48 hours prior to surgery.   Do not bring valuables to the hospital. [facility] is not responsible for any belongings or valuables. Wear Clean/Comfortable clothing the morning of surgery Remember to brush your teeth WITH YOUR REGULAR TOOTHPASTE.   Please read over the following fact sheets that you were given.   3 days prior to your procedure or After your COVID test   You are not required to quarantine however you are required to wear a well-fitting mask when you are out and around people not in your household. If your mask becomes wet or soiled, replace with a new one.   Wash your hands often with soap and water for 20 seconds or clean your hands with an alcohol-based hand sanitizer that contains at least 60% alcohol.   Do not share personal items.   Notify your provider:  o if you are in close contact with someone who has COVID  o or if you develop a fever of 100.4 or greater, sneezing, cough, sore throat, shortness of breath or body aches.

## 2021-09-12 NOTE — Anesthesia Preprocedure Evaluation (Addendum)
Anesthesia Evaluation  Patient identified by MRN, date of birth, ID band Patient awake    Reviewed: Allergy & Precautions, NPO status , Patient's Chart, lab work & pertinent test results  Airway Mallampati: II  TM Distance: >3 FB Neck ROM: Full    Dental  (+) Teeth Intact   Pulmonary neg pulmonary ROS,    Pulmonary exam normal        Cardiovascular negative cardio ROS   Rhythm:Regular Rate:Normal     Neuro/Psych  Headaches, Depression    GI/Hepatic negative GI ROS, Neg liver ROS,   Endo/Other  negative endocrine ROS  Renal/GU negative Renal ROS     Musculoskeletal  (+) Arthritis , Osteoarthritis,  spondylolisthesis   Abdominal Normal abdominal exam  (+)   Peds  Hematology negative hematology ROS (+)   Anesthesia Other Findings   Reproductive/Obstetrics                            Anesthesia Physical Anesthesia Plan  ASA: 2  Anesthesia Plan: General   Post-op Pain Management:    Induction: Intravenous  PONV Risk Score and Plan: 3 and Ondansetron, Dexamethasone and Treatment may vary due to age or medical condition  Airway Management Planned: Mask and Oral ETT  Additional Equipment: None  Intra-op Plan:   Post-operative Plan: Extubation in OR  Informed Consent: I have reviewed the patients History and Physical, chart, labs and discussed the procedure including the risks, benefits and alternatives for the proposed anesthesia with the patient or authorized representative who has indicated his/her understanding and acceptance.     Dental advisory given  Plan Discussed with: CRNA  Anesthesia Plan Comments: (Lab Results      Component                Value               Date                      WBC                      4.9                 09/11/2021                HGB                      14.2                09/11/2021                HCT                      42.6                 09/11/2021                MCV                      91.0                09/11/2021                PLT                      268  09/11/2021               )       Anesthesia Quick Evaluation

## 2021-09-13 ENCOUNTER — Encounter (HOSPITAL_COMMUNITY)
Admission: RE | Disposition: A | Discharge status: Home / Self Care | Payer: Self-pay | Source: Home / Self Care | Attending: Neurological Surgery

## 2021-09-13 ENCOUNTER — Ambulatory Visit (HOSPITAL_COMMUNITY): Payer: Medicare Other | Admitting: Anesthesiology

## 2021-09-13 ENCOUNTER — Observation Stay (HOSPITAL_COMMUNITY)
Admission: RE | Admit: 2021-09-13 | Discharge: 2021-09-14 | Disposition: A | Discharge status: Home / Self Care | Payer: Medicare Other | Attending: Neurological Surgery | Admitting: Neurological Surgery

## 2021-09-13 ENCOUNTER — Other Ambulatory Visit: Payer: Self-pay

## 2021-09-13 ENCOUNTER — Encounter (HOSPITAL_COMMUNITY): Payer: Self-pay | Admitting: Neurological Surgery

## 2021-09-13 ENCOUNTER — Ambulatory Visit (HOSPITAL_COMMUNITY): Payer: Medicare Other

## 2021-09-13 DIAGNOSIS — Z419 Encounter for procedure for purposes other than remedying health state, unspecified: Secondary | ICD-10-CM

## 2021-09-13 DIAGNOSIS — M4696 Unspecified inflammatory spondylopathy, lumbar region: Secondary | ICD-10-CM | POA: Insufficient documentation

## 2021-09-13 DIAGNOSIS — K5730 Diverticulosis of large intestine without perforation or abscess without bleeding: Secondary | ICD-10-CM | POA: Diagnosis not present

## 2021-09-13 DIAGNOSIS — M48061 Spinal stenosis, lumbar region without neurogenic claudication: Secondary | ICD-10-CM | POA: Diagnosis not present

## 2021-09-13 DIAGNOSIS — M4326 Fusion of spine, lumbar region: Secondary | ICD-10-CM | POA: Diagnosis not present

## 2021-09-13 DIAGNOSIS — M4316 Spondylolisthesis, lumbar region: Secondary | ICD-10-CM | POA: Diagnosis not present

## 2021-09-13 DIAGNOSIS — Z981 Arthrodesis status: Secondary | ICD-10-CM | POA: Diagnosis not present

## 2021-09-13 LAB — ABO/RH: ABO/RH(D): A POS

## 2021-09-13 SURGERY — POSTERIOR LUMBAR FUSION 1 LEVEL
Anesthesia: General | Site: Back

## 2021-09-13 MED ORDER — OXYCODONE HCL 5 MG PO TABS
5.0000 mg | ORAL_TABLET | ORAL | Status: DC | PRN
  Administered 2021-09-13 – 2021-09-14 (×5): 5 mg via ORAL
  Filled 2021-09-13 (×5): qty 1

## 2021-09-13 MED ORDER — ROCURONIUM BROMIDE 10 MG/ML (PF) SYRINGE
PREFILLED_SYRINGE | INTRAVENOUS | Status: AC
  Filled 2021-09-13: qty 10

## 2021-09-13 MED ORDER — LACTATED RINGERS IV SOLN: INTRAVENOUS | Status: DC

## 2021-09-13 MED ORDER — ACETAMINOPHEN 325 MG PO TABS: 650.0000 mg | ORAL_TABLET | ORAL | Status: DC | PRN

## 2021-09-13 MED ORDER — FENTANYL CITRATE (PF) 100 MCG/2ML IJ SOLN
25.0000 ug | INTRAMUSCULAR | Status: DC | PRN
  Administered 2021-09-13: 25 ug via INTRAVENOUS

## 2021-09-13 MED ORDER — PHENYLEPHRINE 40 MCG/ML (10ML) SYRINGE FOR IV PUSH (FOR BLOOD PRESSURE SUPPORT)
PREFILLED_SYRINGE | INTRAVENOUS | Status: DC | PRN
  Administered 2021-09-13 (×2): 120 ug via INTRAVENOUS
  Administered 2021-09-13: 160 ug via INTRAVENOUS

## 2021-09-13 MED ORDER — PHENYLEPHRINE 40 MCG/ML (10ML) SYRINGE FOR IV PUSH (FOR BLOOD PRESSURE SUPPORT)
PREFILLED_SYRINGE | INTRAVENOUS | Status: AC
  Filled 2021-09-13: qty 10

## 2021-09-13 MED ORDER — THROMBIN 5000 UNITS EX SOLR
CUTANEOUS | Status: AC
  Filled 2021-09-13: qty 5000

## 2021-09-13 MED ORDER — MENTHOL 3 MG MT LOZG: 1.0000 | LOZENGE | OROMUCOSAL | Status: DC | PRN

## 2021-09-13 MED ORDER — THROMBIN 5000 UNITS EX SOLR
OROMUCOSAL | Status: DC | PRN
  Administered 2021-09-13: 5 mL via TOPICAL

## 2021-09-13 MED ORDER — CEFAZOLIN SODIUM-DEXTROSE 2-4 GM/100ML-% IV SOLN
2.0000 g | Freq: Three times a day (TID) | INTRAVENOUS | Status: AC
  Administered 2021-09-13 – 2021-09-14 (×2): 2 g via INTRAVENOUS
  Filled 2021-09-13 (×2): qty 100

## 2021-09-13 MED ORDER — ROCURONIUM BROMIDE 10 MG/ML (PF) SYRINGE
PREFILLED_SYRINGE | INTRAVENOUS | Status: DC | PRN
Start: 2021-09-13 — End: 2021-09-13
  Administered 2021-09-13: 100 mg via INTRAVENOUS

## 2021-09-13 MED ORDER — ACETAMINOPHEN 500 MG PO TABS
1000.0000 mg | ORAL_TABLET | Freq: Four times a day (QID) | ORAL | Status: AC
  Administered 2021-09-13 – 2021-09-14 (×4): 1000 mg via ORAL
  Filled 2021-09-13 (×4): qty 2

## 2021-09-13 MED ORDER — ORAL CARE MOUTH RINSE: 15.0000 mL | Freq: Once | OROMUCOSAL | Status: AC

## 2021-09-13 MED ORDER — LIDOCAINE 2% (20 MG/ML) 5 ML SYRINGE
INTRAMUSCULAR | Status: DC | PRN
  Administered 2021-09-13: 80 mg via INTRAVENOUS

## 2021-09-13 MED ORDER — GABAPENTIN 300 MG PO CAPS
300.0000 mg | ORAL_CAPSULE | ORAL | Status: AC
  Administered 2021-09-13: 300 mg via ORAL
  Filled 2021-09-13: qty 1

## 2021-09-13 MED ORDER — DEXAMETHASONE SODIUM PHOSPHATE 10 MG/ML IJ SOLN
10.0000 mg | Freq: Once | INTRAMUSCULAR | Status: AC
  Administered 2021-09-13: 10 mg via INTRAVENOUS

## 2021-09-13 MED ORDER — ESCITALOPRAM OXALATE 10 MG PO TABS
5.0000 mg | ORAL_TABLET | Freq: Every day | ORAL | Status: DC
  Filled 2021-09-13: qty 0.5

## 2021-09-13 MED ORDER — HYDROMORPHONE HCL 1 MG/ML IJ SOLN
INTRAMUSCULAR | Status: AC
  Filled 2021-09-13: qty 0.5

## 2021-09-13 MED ORDER — HYDROMORPHONE HCL 1 MG/ML IJ SOLN
INTRAMUSCULAR | Status: DC | PRN
  Administered 2021-09-13: .5 mg via INTRAVENOUS

## 2021-09-13 MED ORDER — SODIUM CHLORIDE 0.9% FLUSH
3.0000 mL | Freq: Two times a day (BID) | INTRAVENOUS | Status: DC
  Administered 2021-09-13 (×2): 3 mL via INTRAVENOUS

## 2021-09-13 MED ORDER — CHLORHEXIDINE GLUCONATE CLOTH 2 % EX PADS: 6.0000 | MEDICATED_PAD | Freq: Once | CUTANEOUS | Status: DC

## 2021-09-13 MED ORDER — DEXAMETHASONE SODIUM PHOSPHATE 4 MG/ML IJ SOLN
4.0000 mg | Freq: Four times a day (QID) | INTRAMUSCULAR | Status: DC
  Administered 2021-09-14: 4 mg via INTRAVENOUS
  Filled 2021-09-13: qty 1

## 2021-09-13 MED ORDER — SENNA 8.6 MG PO TABS
1.0000 | ORAL_TABLET | Freq: Two times a day (BID) | ORAL | Status: DC
  Administered 2021-09-13 (×2): 8.6 mg via ORAL
  Filled 2021-09-13 (×2): qty 1

## 2021-09-13 MED ORDER — MIDAZOLAM HCL 2 MG/2ML IJ SOLN
INTRAMUSCULAR | Status: DC | PRN
  Administered 2021-09-13: 2 mg via INTRAVENOUS

## 2021-09-13 MED ORDER — ONDANSETRON HCL 4 MG/2ML IJ SOLN: 4.0000 mg | Freq: Four times a day (QID) | INTRAMUSCULAR | Status: DC | PRN

## 2021-09-13 MED ORDER — BUPIVACAINE HCL (PF) 0.25 % IJ SOLN
INTRAMUSCULAR | Status: DC | PRN
  Administered 2021-09-13: 4 mL
  Administered 2021-09-13: 10 mL

## 2021-09-13 MED ORDER — MORPHINE SULFATE (PF) 2 MG/ML IV SOLN: 2.0000 mg | INTRAVENOUS | Status: DC | PRN

## 2021-09-13 MED ORDER — MIDAZOLAM HCL 2 MG/2ML IJ SOLN
INTRAMUSCULAR | Status: AC
  Filled 2021-09-13: qty 2

## 2021-09-13 MED ORDER — PROPOFOL 10 MG/ML IV BOLUS
INTRAVENOUS | Status: AC
  Filled 2021-09-13: qty 20

## 2021-09-13 MED ORDER — FENTANYL CITRATE (PF) 250 MCG/5ML IJ SOLN
INTRAMUSCULAR | Status: AC
  Filled 2021-09-13: qty 5

## 2021-09-13 MED ORDER — FENTANYL CITRATE (PF) 250 MCG/5ML IJ SOLN
INTRAMUSCULAR | Status: DC | PRN
  Administered 2021-09-13: 50 ug via INTRAVENOUS
  Administered 2021-09-13: 100 ug via INTRAVENOUS
  Administered 2021-09-13 (×2): 50 ug via INTRAVENOUS

## 2021-09-13 MED ORDER — SODIUM CHLORIDE 0.9 % IV SOLN: 250.0000 mL | INTRAVENOUS | Status: DC

## 2021-09-13 MED ORDER — DEXAMETHASONE SODIUM PHOSPHATE 10 MG/ML IJ SOLN
INTRAMUSCULAR | Status: AC
  Filled 2021-09-13: qty 1

## 2021-09-13 MED ORDER — ACETAMINOPHEN 500 MG PO TABS
1000.0000 mg | ORAL_TABLET | ORAL | Status: DC
  Filled 2021-09-13: qty 2

## 2021-09-13 MED ORDER — PHENYLEPHRINE HCL (PRESSORS) 10 MG/ML IV SOLN
INTRAVENOUS | Status: AC
  Filled 2021-09-13: qty 2

## 2021-09-13 MED ORDER — PHENYLEPHRINE HCL-NACL 20-0.9 MG/250ML-% IV SOLN
INTRAVENOUS | Status: DC | PRN
  Administered 2021-09-13: 50 ug/min via INTRAVENOUS

## 2021-09-13 MED ORDER — PROPOFOL 10 MG/ML IV BOLUS
INTRAVENOUS | Status: DC | PRN
  Administered 2021-09-13: 100 mg via INTRAVENOUS
  Administered 2021-09-13: 50 mg via INTRAVENOUS

## 2021-09-13 MED ORDER — ACETAMINOPHEN 650 MG RE SUPP: 650.0000 mg | RECTAL | Status: DC | PRN

## 2021-09-13 MED ORDER — SUGAMMADEX SODIUM 200 MG/2ML IV SOLN
INTRAVENOUS | Status: DC | PRN
  Administered 2021-09-13: 200 mg via INTRAVENOUS

## 2021-09-13 MED ORDER — 0.9 % SODIUM CHLORIDE (POUR BTL) OPTIME
TOPICAL | Status: DC | PRN
  Administered 2021-09-13: 1000 mL

## 2021-09-13 MED ORDER — ONDANSETRON HCL 4 MG PO TABS: 4.0000 mg | ORAL_TABLET | Freq: Four times a day (QID) | ORAL | Status: DC | PRN

## 2021-09-13 MED ORDER — FENTANYL CITRATE (PF) 100 MCG/2ML IJ SOLN
INTRAMUSCULAR | Status: AC
  Filled 2021-09-13: qty 2

## 2021-09-13 MED ORDER — ONDANSETRON HCL 4 MG/2ML IJ SOLN
INTRAMUSCULAR | Status: DC | PRN
  Administered 2021-09-13: 4 mg via INTRAVENOUS

## 2021-09-13 MED ORDER — CHLORHEXIDINE GLUCONATE 0.12 % MT SOLN
15.0000 mL | Freq: Once | OROMUCOSAL | Status: AC
  Administered 2021-09-13: 15 mL via OROMUCOSAL
  Filled 2021-09-13: qty 15

## 2021-09-13 MED ORDER — CYCLOBENZAPRINE HCL 5 MG PO TABS
5.0000 mg | ORAL_TABLET | Freq: Three times a day (TID) | ORAL | Status: DC | PRN
  Administered 2021-09-13: 5 mg via ORAL
  Filled 2021-09-13: qty 1

## 2021-09-13 MED ORDER — DEXAMETHASONE 4 MG PO TABS
4.0000 mg | ORAL_TABLET | Freq: Four times a day (QID) | ORAL | Status: DC
  Administered 2021-09-13 – 2021-09-14 (×3): 4 mg via ORAL
  Filled 2021-09-13 (×3): qty 1

## 2021-09-13 MED ORDER — PHENOL 1.4 % MT LIQD: 1.0000 | OROMUCOSAL | Status: DC | PRN

## 2021-09-13 MED ORDER — LIDOCAINE 2% (20 MG/ML) 5 ML SYRINGE
INTRAMUSCULAR | Status: AC
  Filled 2021-09-13: qty 5

## 2021-09-13 MED ORDER — ONDANSETRON HCL 4 MG/2ML IJ SOLN
INTRAMUSCULAR | Status: AC
  Filled 2021-09-13: qty 2

## 2021-09-13 MED ORDER — ARTIFICIAL TEARS OPHTHALMIC OINT
TOPICAL_OINTMENT | OPHTHALMIC | Status: DC | PRN
  Administered 2021-09-13: 1 via OPHTHALMIC

## 2021-09-13 MED ORDER — POTASSIUM CHLORIDE IN NACL 20-0.9 MEQ/L-% IV SOLN: INTRAVENOUS | Status: DC

## 2021-09-13 MED ORDER — SODIUM CHLORIDE 0.9% FLUSH: 3.0000 mL | INTRAVENOUS | Status: DC | PRN

## 2021-09-13 MED ORDER — ACETAMINOPHEN 10 MG/ML IV SOLN: 1000.0000 mg | Freq: Once | INTRAVENOUS | Status: DC | PRN

## 2021-09-13 MED ORDER — CEFAZOLIN SODIUM-DEXTROSE 2-4 GM/100ML-% IV SOLN
2.0000 g | INTRAVENOUS | Status: AC
  Administered 2021-09-13: 2 g via INTRAVENOUS
  Filled 2021-09-13: qty 100

## 2021-09-13 MED ORDER — BUPIVACAINE HCL (PF) 0.25 % IJ SOLN
INTRAMUSCULAR | Status: AC
  Filled 2021-09-13: qty 30

## 2021-09-13 SURGICAL SUPPLY — 65 items
ADH SKN CLS APL DERMABOND .7 (GAUZE/BANDAGES/DRESSINGS) ×1
APL SKNCLS STERI-STRIP NONHPOA (GAUZE/BANDAGES/DRESSINGS) ×1
BAG COUNTER SPONGE SURGICOUNT (BAG) ×3 IMPLANT
BAG SPNG CNTER NS LX DISP (BAG) ×2
BASKET BONE COLLECTION (BASKET) ×2 IMPLANT
BENZOIN TINCTURE PRP APPL 2/3 (GAUZE/BANDAGES/DRESSINGS) ×2 IMPLANT
BLADE CLIPPER SURG (BLADE) IMPLANT
BUR CARBIDE MATCH 3.0 (BURR) ×2 IMPLANT
CANISTER SUCT 3000ML PPV (MISCELLANEOUS) ×2 IMPLANT
CNTNR URN SCR LID CUP LEK RST (MISCELLANEOUS) ×1 IMPLANT
CONT SPEC 4OZ STRL OR WHT (MISCELLANEOUS) ×2
COVER BACK TABLE 60X90IN (DRAPES) ×2 IMPLANT
DERMABOND ADVANCED (GAUZE/BANDAGES/DRESSINGS) ×1
DERMABOND ADVANCED .7 DNX12 (GAUZE/BANDAGES/DRESSINGS) ×1 IMPLANT
DRAPE C-ARM 42X72 X-RAY (DRAPES) ×3 IMPLANT
DRAPE C-ARMOR (DRAPES) ×1 IMPLANT
DRAPE LAPAROTOMY 100X72X124 (DRAPES) ×2 IMPLANT
DRAPE SURG 17X23 STRL (DRAPES) ×2 IMPLANT
DRSG OPSITE POSTOP 4X6 (GAUZE/BANDAGES/DRESSINGS) ×1 IMPLANT
DURAPREP 26ML APPLICATOR (WOUND CARE) ×2 IMPLANT
ELECT KIT SAFEOP EMG/SURF (KITS) ×2
ELECT REM PT RETURN 9FT ADLT (ELECTROSURGICAL) ×2
ELECTRODE KT SAFEOP EMG/SURF (KITS) IMPLANT
ELECTRODE REM PT RTRN 9FT ADLT (ELECTROSURGICAL) ×1 IMPLANT
EVACUATOR 1/8 PVC DRAIN (DRAIN) ×2 IMPLANT
FIBER BONE ALLOSYNC EXPAND 5 (Bone Implant) ×1 IMPLANT
GAUZE 4X4 16PLY ~~LOC~~+RFID DBL (SPONGE) ×1 IMPLANT
GLOVE SURG ENC MOIS LTX SZ7 (GLOVE) ×1 IMPLANT
GLOVE SURG ENC MOIS LTX SZ8 (GLOVE) ×4 IMPLANT
GLOVE SURG UNDER POLY LF SZ7 (GLOVE) ×1 IMPLANT
GOWN STRL REUS W/ TWL LRG LVL3 (GOWN DISPOSABLE) IMPLANT
GOWN STRL REUS W/ TWL XL LVL3 (GOWN DISPOSABLE) ×2 IMPLANT
GOWN STRL REUS W/TWL 2XL LVL3 (GOWN DISPOSABLE) IMPLANT
GOWN STRL REUS W/TWL LRG LVL3 (GOWN DISPOSABLE)
GOWN STRL REUS W/TWL XL LVL3 (GOWN DISPOSABLE) ×4
HEMOSTAT POWDER KIT SURGIFOAM (HEMOSTASIS) ×2 IMPLANT
KIT BASIN OR (CUSTOM PROCEDURE TRAY) ×2 IMPLANT
KIT GRAFTMAG DEL NEURO DISP (NEUROSURGERY SUPPLIES) ×1 IMPLANT
KIT TURNOVER KIT B (KITS) ×2 IMPLANT
MILL MEDIUM DISP (BLADE) ×2 IMPLANT
NDL HYPO 25X1 1.5 SAFETY (NEEDLE) ×1 IMPLANT
NEEDLE HYPO 25X1 1.5 SAFETY (NEEDLE) ×2 IMPLANT
NS IRRIG 1000ML POUR BTL (IV SOLUTION) ×2 IMPLANT
PACK LAMINECTOMY NEURO (CUSTOM PROCEDURE TRAY) ×2 IMPLANT
PAD ARMBOARD 7.5X6 YLW CONV (MISCELLANEOUS) ×5 IMPLANT
PUTTY BONE 2.5CC (Putty) ×1 IMPLANT
ROD LORD LIPPED TI 5.5X35 (Rod) ×2 IMPLANT
SCREW CANC SHANK MOD 5.5X35 (Screw) ×2 IMPLANT
SCREW ILIAC PA 5.5X40 (Screw) ×2 IMPLANT
SCREW POLYAXIAL TULIP (Screw) ×2 IMPLANT
SET SCREW (Screw) ×8 IMPLANT
SET SCREW SPNE (Screw) IMPLANT
SPACER PEEK NANOTEC 9X9X25 5D (Spacer) ×2 IMPLANT
SPONGE SURGIFOAM ABS GEL 100 (HEMOSTASIS) IMPLANT
SPONGE T-LAP 4X18 ~~LOC~~+RFID (SPONGE) ×1 IMPLANT
STRIP CLOSURE SKIN 1/2X4 (GAUZE/BANDAGES/DRESSINGS) ×3 IMPLANT
SUT VIC AB 0 CT1 18XCR BRD8 (SUTURE) ×1 IMPLANT
SUT VIC AB 0 CT1 8-18 (SUTURE) ×2
SUT VIC AB 2-0 CP2 18 (SUTURE) ×2 IMPLANT
SUT VIC AB 3-0 SH 8-18 (SUTURE) ×4 IMPLANT
TOWEL GREEN STERILE (TOWEL DISPOSABLE) ×2 IMPLANT
TOWEL GREEN STERILE FF (TOWEL DISPOSABLE) ×2 IMPLANT
TRAY FOLEY MTR SLVR 14FR STAT (SET/KITS/TRAYS/PACK) ×1 IMPLANT
TRAY FOLEY MTR SLVR 16FR STAT (SET/KITS/TRAYS/PACK) ×1 IMPLANT
WATER STERILE IRR 1000ML POUR (IV SOLUTION) ×2 IMPLANT

## 2021-09-13 NOTE — Anesthesia Postprocedure Evaluation (Signed)
Anesthesia Post Note  Patient: [name]  Procedure(s) Performed: Posterior Lumbar Interbody Fusion  - Lumbar four-Lumbar five (Back)     Patient location during evaluation: PACU Anesthesia Type: General Level of consciousness: awake and alert Pain management: pain level controlled Vital Signs Assessment: post-procedure vital signs reviewed and stable Respiratory status: spontaneous breathing, nonlabored ventilation, respiratory function stable and patient connected to nasal cannula oxygen Cardiovascular status: blood pressure returned to baseline and stable Postop Assessment: no apparent nausea or vomiting Anesthetic complications: no   No notable events documented.  Last Vitals:  Vitals:   09/13/21 1335 09/13/21 1636  BP: 134/79 130/68  Pulse: (!) 108 (!) 103  Resp: 20 18  Temp: 36.8 C 36.8 C  SpO2: 91% 98%    Last Pain:  Vitals:   09/13/21 1720  TempSrc:   PainSc: 6                  Mayley Lish P Delanna Blacketer

## 2021-09-13 NOTE — Transfer of Care (Signed)
Immediate Anesthesia Transfer of Care Note  Patient: [name]  Procedure(s) Performed: Posterior Lumbar Interbody Fusion  - Lumbar four-Lumbar five (Back)  Patient Location: PACU  Anesthesia Type:General  Level of Consciousness: drowsy, patient cooperative and responds to stimulation  Airway & Oxygen Therapy: Patient Spontanous Breathing  Post-op Assessment: Report given to RN and Post -op Vital signs reviewed and stable  Post vital signs: Reviewed and stable  Last Vitals:  Vitals Value Taken Time  BP 118/86 09/13/21 1119  Temp    Pulse 117 09/13/21 1121  Resp 16 09/13/21 1121  SpO2 93 % 09/13/21 1121  Vitals shown include unvalidated device data.  Last Pain:  Vitals:   09/13/21 0714  TempSrc:   PainSc: 0-No pain         Complications: No notable events documented.

## 2021-09-13 NOTE — Anesthesia Procedure Notes (Signed)
Procedure Name: Intubation Date/Time: 09/13/2021 8:44 AM Performed by: Janace Litten, CRNA Pre-anesthesia Checklist: Patient identified, Emergency Drugs available, Suction available and Patient being monitored Patient Re-evaluated:Patient Re-evaluated prior to induction Oxygen Delivery Method: Circle System Utilized Preoxygenation: Pre-oxygenation with 100% oxygen Induction Type: IV induction Ventilation: Mask ventilation without difficulty Laryngoscope Size: Mac and 3 Grade View: Grade II Tube type: Oral Tube size: 7.0 mm Number of attempts: 1 Airway Equipment and Method: Stylet and Oral airway Placement Confirmation: ETT inserted through vocal cords under direct vision, positive ETCO2 and breath sounds checked- equal and bilateral Secured at: 21 cm Tube secured with: Tape Dental Injury: Teeth and Oropharynx as per pre-operative assessment

## 2021-09-13 NOTE — Op Note (Signed)
09/13/2021  11:16 AM  PATIENT:  [name]  70 y.o. 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 Decompressive lumbar laminectomy, hemi facetectomy and foraminotomies L4-5 requiring more work than would be required for a simple exposure of the disk for PLIF in order to adequately decompress the neural elements and address the spinal stenosis 2. Posterior lumbar interbody fusion L4-5 using peek interbody cages packed with morcellized allograft and autograft  3. Posterior fixation L4-5 using Alphatec cortical pedicle screws.  4. Intertransverse arthrodesis L4 5 using morcellized autograft and allograft.  SURGEON:  Sherley Bounds, MD  ASSISTANTS: Glenford Peers FNP  ANESTHESIA:  General  EBL: 300 ml  Total I/O In: 2100 [I.V.:2100] Out: 400 [Urine:100; Blood:300]  BLOOD ADMINISTERED:none  DRAINS: none   INDICATION FOR PROCEDURE: This patient presented with back pain with leg pain. Imaging revealed dynamic degenerative spondylolisthesis L4-5 with spinal stenosis. The patient tried a reasonable attempt at conservative medical measures without relief. I recommended decompression and instrumented fusion to address the stenosis as well as the segmental  instability.  Patient understood the risks, benefits, and alternatives and potential outcomes and wished to proceed.  PROCEDURE DETAILS:  The patient was brought to the operating room. After induction of generalized endotracheal anesthesia the patient was rolled into the prone position on chest rolls and all pressure points were padded. The patient's lumbar region was cleaned and then prepped with DuraPrep and draped in the usual sterile fashion. Anesthesia was injected and then a dorsal midline incision was made and carried down to the lumbosacral fascia. The fascia was opened and the  paraspinous musculature was taken down in a subperiosteal fashion to expose L4-5. A self-retaining retractor was placed. Intraoperative fluoroscopy confirmed my level, and I started with placement of the L4 cortical pedicle screws. The pedicle screw entry zones were identified utilizing surface landmarks and  AP and lateral fluoroscopy. I scored the cortex with the high-speed drill and then used the hand drill to drill an upward and outward direction into the pedicle. I then tapped line to line. I then placed a 5.5 x 35 mm cortical pedicle screw into the pedicles of L4 bilaterally.    I then turned my attention to the decompression and complete lumbar laminectomies, hemi- facetectomies, and foraminotomies were performed at L4-5.  My nurse practitioner was directly involved in the decompression and exposure of the neural elements. the patient had significant spinal stenosis and this required more work than would be required for a simple exposure of the disc for posterior lumbar interbody fusion which would only require a limited laminotomy. Much more generous decompression and generous foraminotomy was undertaken in order to adequately decompress the neural elements and address the patient's leg pain. The yellow ligament was removed to expose the underlying dura and nerve roots, and generous foraminotomies were performed to adequately decompress the neural elements. Both the exiting and traversing nerve roots were decompressed on both sides until a coronary dilator passed easily along the nerve roots. Once the decompression was complete, I turned my attention to the posterior lower lumbar interbody fusion. The epidural venous vasculature was coagulated and cut sharply. Disc space was incised and the initial discectomy was performed with pituitary rongeurs. The disc space was distracted with sequential distractors to a height of 9 mm. We then used a series of scrapers and shavers to prepare the endplates for fusion.  The midline was prepared with Epstein curettes. Once the complete discectomy was finished, we packed an appropriate sized interbody cage with local autograft and morcellized allograft, gently retracted the nerve root, and tapped the cage into position at L4-5.  The midline between the cages was packed with morselized autograft and allograft.   We then turned our attention to the placement of the lower pedicle screws. The pedicle screw entry zones were identified utilizing surface landmarks and fluoroscopy. I drilled into each pedicle utilizing the hand drill, and tapped each pedicle with the appropriate tap. We palpated with a ball probe to assure no break in the cortex. We then placed 5.5 x 40 mm pedicle screw into the pedicles bilaterally at L5.  My nurse practitioner assisted in placement of the pedicle screws.  We then decorticated the transverse processes and laid a mixture of morcellized autograft and allograft out over these to perform intertransverse arthrodesis at L4-5. We then placed lordotic rods into the multiaxial screw heads of the pedicle screws and locked these in position with the locking caps and anti-torque device. We then checked our construct with AP and lateral fluoroscopy. Irrigated with copious amounts of bacitracin-containing saline solution. Inspected the nerve roots once again to assure adequate decompression, lined to the dura with Gelfoam,  and then we closed the muscle and the fascia with 0 Vicryl. Closed the subcutaneous tissues with 2-0 Vicryl and subcuticular tissues with 3-0 Vicryl. The skin was closed with benzoin and Steri-Strips. Dressing was then applied, the patient was awakened from general anesthesia and transported to the recovery room in stable condition. At the end of the procedure all sponge, needle and instrument counts were correct.   PLAN OF CARE: admit to inpatient  PATIENT DISPOSITION:  PACU - hemodynamically stable.   Delay start of Pharmacological VTE agent  (>24hrs) due to surgical blood loss or risk of bleeding:  yes

## 2021-09-13 NOTE — H&P (Signed)
Subjective: Patient is a 70 y.o. female admitted for back and leg pain. Onset of symptoms was several months ago, gradually worsening since that time.  The pain is rated severe, and is located at the across the lower back and radiates to legs. The pain is described as aching and occurs all day. The symptoms have been progressive. Symptoms are exacerbated by exercise, standing, and walking for more than a few minutes. 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 Authorizing Provider  acyclovir (ZOVIRAX) 200 MG capsule TAKE ONE CAPSULE (200 MG TOTAL) BY MOUTH DAILY. Patient taking differently: Take 200 mg by mouth daily as needed (outbreaks). 07/04/20  Yes Billie Ruddy, MD  cyclobenzaprine (FLEXERIL) 5 MG tablet Take 5 mg by mouth 3 (three) times daily as needed for muscle spasms.   Yes [provider]  escitalopram (LEXAPRO) 20 MG tablet TAKE ONE TABLET (20 MG TOTAL) BY MOUTH EVERY MORNING. 10/19/20  Yes Billie Ruddy, MD  Multiple Vitamins-Minerals (MULTIVITAMIN GUMMIES ADULT PO) Take 1 tablet by mouth daily.   Yes [provider]   Allergies  Allergen Reactions   Sulfur Hives   Sulfamethoxazole Hives    Happened in childhood, does not remember reaction.    Social History   Tobacco Use   Smoking status: Never   Smokeless tobacco: Never  Substance Use Topics   Alcohol use: No    Family History  Problem Relation Age of Onset   Diabetes Mother    Heart disease Mother    Obesity Mother    Dementia Mother    Heart disease Father     Stroke Father    Colon cancer Paternal Aunt    Other Paternal Aunt        esophageal stricture   Esophageal cancer Neg Hx    Rectal cancer Neg Hx    Stomach cancer Neg Hx      Review of Systems  Positive ROS: neg  All other systems have been reviewed and were otherwise negative with the exception of those mentioned in the HPI and as above.  Objective: Vital signs in last 24 hours: Temp:  [98.4 F (36.9 C)] 98.4 F (36.9 C) (11/02 0657) Pulse Rate:  [87] 87 (11/02 0657) Resp:  [18] 18 (11/02 0657) BP: (157)/(81) 157/81 (11/02 0657) SpO2:  [97 %] 97 % (11/02 0657) Weight:  [74.4 kg] 74.4 kg (11/02 0657)  General Appearance: Alert, cooperative, no distress, appears stated age Head: Normocephalic, without obvious abnormality, atraumatic Eyes: PERRL, conjunctiva/corneas clear, EOM's intact    Neck: Supple, symmetrical, trachea midline Back: Symmetric, no curvature, ROM normal, no CVA tenderness Lungs:  respirations unlabored Heart: Regular rate and rhythm Abdomen: Soft, non-tender Extremities: Extremities normal, atraumatic, no cyanosis or edema Pulses: 2+ and symmetric all extremities Skin: Skin color, texture, turgor normal, no rashes or lesions  NEUROLOGIC:   Mental status: Alert and oriented x4,  no aphasia, good attention span, fund of knowledge, and memory Motor Exam - grossly normal Sensory Exam - grossly normal Reflexes: 1+ Coordination -  grossly normal Gait - grossly normal Balance - grossly normal Cranial Nerves: I: smell Not tested  II: visual acuity  OS: nl    OD: nl  II: visual fields Full to confrontation  II: pupils Equal, round, reactive to light  III,VII: ptosis None  III,IV,VI: extraocular muscles  Full ROM  V: mastication Normal  V: facial light touch sensation  Normal  V,VII: corneal reflex  Present  VII: facial muscle function - upper  Normal  VII: facial muscle function - lower Normal  VIII: hearing Not tested  IX: soft palate elevation   Normal  IX,X: gag reflex Present  XI: trapezius strength  5/5  XI: sternocleidomastoid strength 5/5  XI: neck flexion strength  5/5  XII: tongue strength  Normal    Data Review Lab Results  Component Value Date   WBC 4.9 09/11/2021   HGB 14.2 09/11/2021   HCT 42.6 09/11/2021   MCV 91.0 09/11/2021   PLT 268 09/11/2021   Lab Results  Component Value Date   NA 142 07/23/2019   K 4.3 07/23/2019   CL 105 07/23/2019   CO2 29 07/23/2019   BUN 15 07/23/2019   CREATININE 0.86 07/23/2019   GLUCOSE 98 07/23/2019   Lab Results  Component Value Date   INR 0.9 09/11/2021    Assessment/Plan:  Estimated body mass index is 27.29 kg/m as calculated from the following:   Height as of this encounter: 5\' 5"  (1.651 m).   Weight as of this encounter: 74.4 kg. Patient admitted for PLIF L4-5. Patient has failed a reasonable attempt at conservative therapy.  I explained the condition and procedure to the patient and answered any questions.  Patient wishes to proceed with procedure as planned. Understands risks/ benefits and typical outcomes of procedure.   Eustace Moore 09/13/2021 8:32 AM

## 2021-09-14 DIAGNOSIS — M48061 Spinal stenosis, lumbar region without neurogenic claudication: Secondary | ICD-10-CM | POA: Diagnosis not present

## 2021-09-14 DIAGNOSIS — M4316 Spondylolisthesis, lumbar region: Secondary | ICD-10-CM | POA: Diagnosis not present

## 2021-09-14 DIAGNOSIS — M4696 Unspecified inflammatory spondylopathy, lumbar region: Secondary | ICD-10-CM | POA: Diagnosis not present

## 2021-09-14 MED ORDER — HYDROCODONE-ACETAMINOPHEN 5-325 MG PO TABS: 1.0000 | ORAL_TABLET | ORAL | 0 refills | Status: DC | PRN

## 2021-09-14 NOTE — Evaluation (Signed)
Occupational Therapy Evaluation/Discharge Patient Details Name: [name] MRN: [id number] DOB: 1951-04-23 Today's Date: 09/14/2021   History of Present Illness Pt is a 70 y/o female with progressive back and leg pain. Imaging showed spondylolisthesis with stenosis at L4-5 levels. Pt elected to undergo L4-5 fusion on 11/2. PMH: depression, diverticulitis   Clinical Impression   PTA, pt lives with spouse and reports complete Independence in all daily tasks. Pt presents now s/p procedure above with significant pain improvements. Educated on spinal precautions for ADLs/IADLs with pt actively involved in education. Pt able to return demo ADLs, brace mgmt, and in room mobility without AD Independently. Educated on use of BSC over toilet to increase ease of transfers and use of BSC as shower chair. Pt reports she will have IADL assist as needed and plans to stay on first floor of home initially. No further skilled OT services needed at the acute level or on DC. OT to sign off.       Recommendations for follow up therapy are one component of a multi-disciplinary discharge planning process, led by the attending physician.  Recommendations may be updated based on patient status, additional functional criteria and insurance authorization.   Follow Up Recommendations  No OT follow up    Assistance Recommended at Discharge PRN  Functional Status Assessment  Patient has not had a recent decline in their functional status  Equipment Recommendations  BSC (pt may shop around online for any DME needed)    Recommendations for Other Services       Precautions / Restrictions Precautions Precautions: Back Precaution Booklet Issued: Yes (comment) Required Braces or Orthoses: Spinal Brace Spinal Brace: Lumbar corset;Applied in sitting position Restrictions Weight Bearing Restrictions: No      Mobility Bed Mobility Overal bed mobility: Independent             General bed mobility comments:  educated on log roll but pt opted to sit up straight and walk legs off of bed based on video provided prior to sx    Transfers Overall transfer level: Independent Equipment used: None                      Balance Overall balance assessment: Independent                                         ADL either performed or assessed with clinical judgement   ADL Overall ADL's : Independent                                       General ADL Comments: Educated on spinal precautions for ADL/IADLs. Pt able to return demo UB/LB dressing and brace mgmt without assist. Pt reports completing many IADLs prior to sx (laundry, meal prep) and husband can assist as needed     Vision Baseline Vision/History: 1 Wears glasses Ability to See in Adequate Light: 0 Adequate Patient Visual Report: No change from baseline Vision Assessment?: No apparent visual deficits     Perception     Praxis      Pertinent Vitals/Pain Pain Assessment: No/denies pain     Hand Dominance Right   Extremity/Trunk Assessment Upper Extremity Assessment Upper Extremity Assessment: Overall WFL for tasks assessed   Lower Extremity Assessment Lower Extremity Assessment: Defer to PT  evaluation   Cervical / Trunk Assessment Cervical / Trunk Assessment: Back Surgery   Communication Communication Communication: No difficulties   Cognition Arousal/Alertness: Awake/alert Behavior During Therapy: WFL for tasks assessed/performed Overall Cognitive Status: Within Functional Limits for tasks assessed                                       General Comments       Exercises     Shoulder Instructions      Home Living Family/patient expects to be discharged to:: Private residence Living Arrangements: Spouse/significant other Available Help at Discharge: Family;Available 24 hours/day Type of Home: House Home Access: Stairs to enter [employer] of Steps:  4 Entrance Stairs-Rails: Left;Right Home Layout: Two level;Able to live on main level with bedroom/bathroom Alternate Level Stairs-Number of Steps: flight   Bathroom Shower/Tub: Occupational psychologist: Standard     Home Equipment: Adaptive equipment;Hand held Database administrator Equipment: Reacher Additional Comments: Pt master bedroom is upstairs but plans to stay on main level with guest bed and full bath. Pt reports husband with "recent shoulder surgery" (sounds like it was in Jan 2022, may have recently completed rehab for this)      Prior Functioning/Environment Prior Level of Function : Independent/Modified Independent;Driving             Mobility Comments: no use of AD for mobility ADLs Comments: Independent with ADLs, IADLs. Active in church and community        OT Problem List:        OT Treatment/Interventions:      OT Goals(Current goals can be found in the care plan section) Acute Rehab OT Goals Patient Stated Goal: recover completely OT Goal Formulation: All assessment and education complete, DC therapy  OT Frequency:     Barriers to D/C:            Co-evaluation              AM-PAC OT "6 Clicks" Daily Activity     Outcome Measure Help from another person eating meals?: None Help from another person taking care of personal grooming?: None Help from another person toileting, which includes using toliet, bedpan, or urinal?: None Help from another person bathing (including washing, rinsing, drying)?: None Help from another person to put on and taking off regular upper body clothing?: None Help from another person to put on and taking off regular lower body clothing?: None 6 Click Score: 24   End of Session Equipment Utilized During Treatment: Back brace Nurse Communication: Mobility status  Activity Tolerance: Patient tolerated treatment well Patient left: in bed;with call bell/phone within reach  OT Visit Diagnosis: Pain Pain -  part of body:  (back)                Time: 2233-6122 OT Time Calculation (min): 25 min Charges:  OT General Charges $OT Visit: 1 Visit OT Evaluation $OT Eval Low Complexity: 1 Low OT Treatments $Self Care/Home Management : 8-22 mins  Malachy Chamber, OTR/L Acute Rehab Services Office: [phone]   Layla Maw 09/14/2021, 7:40 AM

## 2021-09-14 NOTE — Evaluation (Signed)
Physical Therapy Evaluation Patient Details Name: [name] MRN: [id number] DOB: 02-27-1951 Today's Date: 09/14/2021  History of Present Illness  70 y/o female presents to [facility] hospital 09/13/2021 with progressive back and leg pain. Imaging showed spondylolisthesis with stenosis at L4-5 levels. Pt elected to undergo L4-5 fusion on 11/2. PMH: depression, diverticulitis.  Clinical Impression  Pt presents to PT s/p L4-5 fusion. Pt performs all mobility required within the home independently or at a modI level. Pt is able to verbalize back precautions and brace use. Pt has support from spouse and demonstrates no further acute PT needs at this time. Acute PT signing off. Pt may benefit from outpatient PT once cleared by surgeon.       Recommendations for follow up therapy are one component of a multi-disciplinary discharge planning process, led by the attending physician.  Recommendations may be updated based on patient status, additional functional criteria and insurance authorization.  Follow Up Recommendations No PT follow up    Assistance Recommended at Discharge None  Functional Status Assessment Patient has not had a recent decline in their functional status  Equipment Recommendations  None recommended by PT    Recommendations for Other Services       Precautions / Restrictions Precautions Precautions: Back Precaution Booklet Issued: Yes (comment) Precaution Comments: pt is able to verbally recall all precautions and brace use Required Braces or Orthoses: Spinal Brace Spinal Brace: Lumbar corset (on upon arrival) Restrictions Weight Bearing Restrictions: No      Mobility  Bed Mobility Overal bed mobility: Independent             General bed mobility comments: pt received sitting at edge of bed, recently completed bed mobiity independently with OT, verblizes bed mobility technique without twisting    Transfers Overall transfer level: Independent Equipment used:  None                    Ambulation/Gait Ambulation/Gait assistance: Independent Gait Distance (Feet): 300 Feet Assistive device: None Gait Pattern/deviations: Step-through pattern Gait velocity: functional Gait velocity interpretation: >2.62 ft/sec, indicative of community ambulatory General Gait Details: pt with steady step-through gait  Stairs Stairs: Yes Stairs assistance: Modified independent (Device/Increase time) Stair Management: One rail Left;Alternating pattern Number of Stairs: 8    Wheelchair Mobility    Modified Rankin (Stroke Patients Only)       Balance Overall balance assessment: Independent                                           Pertinent Vitals/Pain Pain Assessment: No/denies pain    Home Living Family/patient expects to be discharged to:: Private residence Living Arrangements: Spouse/significant other Available Help at Discharge: Family;Available 24 hours/day Type of Home: House Home Access: Stairs to enter Entrance Stairs-Rails: Chemical engineer of Steps: 4 Alternate Level Stairs-Number of Steps: flight Home Layout: Two level;Able to live on main level with bedroom/bathroom Home Equipment: Adaptive equipment;Hand held shower head Additional Comments: Pt master bedroom is upstairs but plans to stay on main level with guest bed and full bath. Pt reports husband with "recent shoulder surgery" (sounds like it was in Jan 2022, may have recently completed rehab for this)    Prior Function Prior Level of Function : Independent/Modified Independent;Driving             Mobility Comments: no use of AD for mobility ADLs  Comments: Independent with ADLs, IADLs. Active in church and community     Hand Dominance   Dominant Hand: Right    Extremity/Trunk Assessment   Upper Extremity Assessment Upper Extremity Assessment: Overall WFL for tasks assessed    Lower Extremity Assessment Lower Extremity  Assessment: Overall WFL for tasks assessed    Cervical / Trunk Assessment Cervical / Trunk Assessment: Back Surgery  Communication   Communication: No difficulties  Cognition Arousal/Alertness: Awake/alert Behavior During Therapy: WFL for tasks assessed/performed Overall Cognitive Status: Within Functional Limits for tasks assessed                                          General Comments General comments (skin integrity, edema, etc.): VSS on RA    Exercises     Assessment/Plan    PT Assessment Patient does not need any further PT services  PT Problem List         PT Treatment Interventions      PT Goals (Current goals can be found in the Care Plan section)       Frequency     Barriers to discharge        Co-evaluation               AM-PAC PT "6 Clicks" Mobility  Outcome Measure Help needed turning from your back to your side while in a flat bed without using bedrails?: None Help needed moving from lying on your back to sitting on the side of a flat bed without using bedrails?: None Help needed moving to and from a bed to a chair (including a wheelchair)?: None Help needed standing up from a chair using your arms (e.g., wheelchair or bedside chair)?: None Help needed to walk in hospital room?: None Help needed climbing 3-5 steps with a railing? : None 6 Click Score: 24    End of Session Equipment Utilized During Treatment: Back brace Activity Tolerance: Patient tolerated treatment well Patient left: in bed;with call bell/phone within reach;with family/visitor present Nurse Communication: Mobility status PT Visit Diagnosis: Other abnormalities of gait and mobility (R26.89)    Time: 6256-3893 PT Time Calculation (min) (ACUTE ONLY): 11 min   Charges:   PT Evaluation $PT Eval Low Complexity: [place], PT, DPT Acute Rehabilitation Pager: [facility]   Zenaida Niece 09/14/2021, 9:02 AM

## 2021-09-14 NOTE — Plan of Care (Signed)
Patient alert and oriented, mae's well, voiding adequate amount of urine, swallowing without difficulty, no c/o pain at time of discharge. Patient discharged home with family. Script and discharged instructions given to patient. Patient and family stated understanding of instructions given. Patient has an appointment with Dr. Jones °

## 2021-09-14 NOTE — Discharge Summary (Signed)
Physician Discharge Summary  Patient ID: [name] MRN: [id number] DOB/AGE: [birth year] 70 y.o.  Admit date: 09/13/2021 Discharge date: 09/14/2021  Admission Diagnoses: spondylolisthesis with stenosis L4-5   Discharge Diagnoses: same   Discharged Condition: good  Hospital Course: The patient was admitted on 09/13/2021 and taken to the operating room where the patient underwent PLIF L4-5. The patient tolerated the procedure well and was taken to the recovery room and then to the floor in stable condition. The hospital course was routine. There were no complications. The wound remained clean dry and intact. Pt had appropriate back soreness. No complaints of leg pain or new N/T/W. The patient remained afebrile with stable vital signs, and tolerated a regular diet. The patient continued to increase activities, and pain was well controlled with oral pain medications.   Consults: None  Significant Diagnostic Studies:  Results for orders placed or performed during the hospital encounter of 09/13/21  ABO/Rh  Result Value Ref Range   ABO/RH(D)      A POS Performed at [facility], [place] [address] [place], [place] [address]     DG Lumbar Spine 2-3 Views  Result Date: 09/13/2021 CLINICAL DATA:  Portable operative imaging for lumbar spine fusion EXAM: LUMBAR SPINE - 2-3 VIEW; DG C-ARM 1-60 MIN-NO REPORT COMPARISON:  08/24/2021. FINDINGS: Two submitted portable spot fluoro graphic images demonstrate placement pedicle screws interconnecting rods at the L4-L5 levels, orthopedic hardware well positioned and well-seated. A mostly radiolucent disc spacer maintains disc height at the L4-L5 level, well positioned. IMPRESSION: Operative images demonstrating well-positioned orthopedic hardware fusing L4 and L5. Electronically Signed   By: Lajean Manes M.D.   On: 09/13/2021 11:31   DG C-Arm 1-60 Min-No Report  Result Date: 09/13/2021 CLINICAL DATA:  Portable operative imaging for lumbar spine  fusion EXAM: LUMBAR SPINE - 2-3 VIEW; DG C-ARM 1-60 MIN-NO REPORT COMPARISON:  08/24/2021. FINDINGS: Two submitted portable spot fluoro graphic images demonstrate placement pedicle screws interconnecting rods at the L4-L5 levels, orthopedic hardware well positioned and well-seated. A mostly radiolucent disc spacer maintains disc height at the L4-L5 level, well positioned. IMPRESSION: Operative images demonstrating well-positioned orthopedic hardware fusing L4 and L5. Electronically Signed   By: Lajean Manes M.D.   On: 09/13/2021 11:31   DG C-Arm 1-60 Min-No Report  Result Date: 09/13/2021 CLINICAL DATA:  Portable operative imaging for lumbar spine fusion EXAM: LUMBAR SPINE - 2-3 VIEW; DG C-ARM 1-60 MIN-NO REPORT COMPARISON:  08/24/2021. FINDINGS: Two submitted portable spot fluoro graphic images demonstrate placement pedicle screws interconnecting rods at the L4-L5 levels, orthopedic hardware well positioned and well-seated. A mostly radiolucent disc spacer maintains disc height at the L4-L5 level, well positioned. IMPRESSION: Operative images demonstrating well-positioned orthopedic hardware fusing L4 and L5. Electronically Signed   By: Lajean Manes M.D.   On: 09/13/2021 11:31   DG C-Arm 1-60 Min-No Report  Result Date: 09/13/2021 CLINICAL DATA:  Portable operative imaging for lumbar spine fusion EXAM: LUMBAR SPINE - 2-3 VIEW; DG C-ARM 1-60 MIN-NO REPORT COMPARISON:  08/24/2021. FINDINGS: Two submitted portable spot fluoro graphic images demonstrate placement pedicle screws interconnecting rods at the L4-L5 levels, orthopedic hardware well positioned and well-seated. A mostly radiolucent disc spacer maintains disc height at the L4-L5 level, well positioned. IMPRESSION: Operative images demonstrating well-positioned orthopedic hardware fusing L4 and L5. Electronically Signed   By: Lajean Manes M.D.   On: 09/13/2021 11:31   MM 3D SCREEN BREAST BILATERAL  Result Date: 09/08/2021 CLINICAL DATA:   Screening.  EXAM: DIGITAL SCREENING BILATERAL MAMMOGRAM WITH TOMOSYNTHESIS AND CAD TECHNIQUE: Bilateral screening digital craniocaudal and mediolateral oblique mammograms were obtained. Bilateral screening digital breast tomosynthesis was performed. The images were evaluated with computer-aided detection. COMPARISON:  Previous exam(s). ACR Breast Density Category c: The breast tissue is heterogeneously dense, which may obscure small masses. FINDINGS: There are no findings suspicious for malignancy. IMPRESSION: No mammographic evidence of malignancy. A result letter of this screening mammogram will be mailed directly to the patient. RECOMMENDATION: Screening mammogram in one year. (Code:SM-B-01Y) BI-RADS CATEGORY  1: Negative. Electronically Signed   By: Marin Olp M.D.   On: 09/08/2021 09:19    Antibiotics:  Anti-infectives (From admission, onward)    Start     Dose/Rate Route Frequency Ordered Stop   09/13/21 1700  ceFAZolin (ANCEF) IVPB 2g/100 mL premix        2 g 200 mL/hr over 30 Minutes Intravenous Every 8 hours 09/13/21 1330 09/14/21 0041   09/13/21 0700  ceFAZolin (ANCEF) IVPB 2g/100 mL premix        2 g 200 mL/hr over 30 Minutes Intravenous On call to O.R. 09/13/21 0645 09/13/21 0903       Discharge Exam: Blood pressure 129/71, pulse 96, temperature 98.2 F (36.8 C), temperature source Oral, resp. rate 20, height 5\' 5"  (1.651 m), weight 74.4 kg, SpO2 96 %. Neurologic: Grossly normal Dressing dry  Discharge Medications:   Allergies as of 09/14/2021       Reactions   Sulfur Hives   Sulfamethoxazole Hives   Happened in childhood, does not remember reaction.        Medication List     TAKE these medications    acyclovir 200 MG capsule Commonly known as: ZOVIRAX TAKE ONE CAPSULE (200 MG TOTAL) BY MOUTH DAILY. What changed: See the new instructions.   cyclobenzaprine 5 MG tablet Commonly known as: FLEXERIL Take 5 mg by mouth 3 (three) times daily as needed for muscle  spasms.   escitalopram 20 MG tablet Commonly known as: LEXAPRO TAKE ONE TABLET (20 MG TOTAL) BY MOUTH EVERY MORNING.   HYDROcodone-acetaminophen 5-325 MG tablet Commonly known as: NORCO/VICODIN Take 1 tablet by mouth every 4 (four) hours as needed for moderate pain.   MULTIVITAMIN GUMMIES ADULT PO Take 1 tablet by mouth daily.               Durable Medical Equipment  (From admission, onward)           Start     Ordered   09/13/21 1331  DME Walker rolling  Once       Question:  Patient needs a walker to treat with the following condition  Answer:  S/P lumbar fusion   09/13/21 1330   09/13/21 1331  DME 3 n 1  Once        09/13/21 1330            Disposition: home   Final Dx: PLIF L4-5  Discharge Instructions     Call MD for:  difficulty breathing, headache or visual disturbances   Complete by: As directed    Call MD for:  hives   Complete by: As directed    Call MD for:  persistant dizziness or light-headedness   Complete by: As directed    Call MD for:  persistant nausea and vomiting   Complete by: As directed    Call MD for:  redness, tenderness, or signs of infection (pain, swelling, redness, odor or green/yellow discharge around incision  site)   Complete by: As directed    Call MD for:  severe uncontrolled pain   Complete by: As directed    Call MD for:  temperature >100.4   Complete by: As directed    Diet - low sodium heart healthy   Complete by: As directed    Driving Restrictions   Complete by: As directed    No driving for 1 weeks, no riding in the car for 1 week   Increase activity slowly   Complete by: As directed    Lifting restrictions   Complete by: As directed    No lifting more than 8 lbs   Remove dressing in 48 hours   Complete by: As directed           Signed: Eustace Moore 09/14/2021, 7:57 AM

## 2021-10-24 DIAGNOSIS — M48061 Spinal stenosis, lumbar region without neurogenic claudication: Secondary | ICD-10-CM | POA: Diagnosis not present

## 2021-10-28 ENCOUNTER — Other Ambulatory Visit: Payer: Self-pay | Admitting: Family Medicine

## 2021-10-28 DIAGNOSIS — Z87828 Personal history of other (healed) physical injury and trauma: Secondary | ICD-10-CM

## 2021-11-13 ENCOUNTER — Other Ambulatory Visit: Payer: Self-pay | Admitting: Family Medicine

## 2021-11-13 DIAGNOSIS — Z8659 Personal history of other mental and behavioral disorders: Secondary | ICD-10-CM

## 2021-12-26 DIAGNOSIS — L821 Other seborrheic keratosis: Secondary | ICD-10-CM | POA: Diagnosis not present

## 2021-12-26 DIAGNOSIS — D485 Neoplasm of uncertain behavior of skin: Secondary | ICD-10-CM | POA: Diagnosis not present

## 2021-12-26 DIAGNOSIS — D2371 Other benign neoplasm of skin of right lower limb, including hip: Secondary | ICD-10-CM | POA: Diagnosis not present

## 2021-12-26 DIAGNOSIS — D225 Melanocytic nevi of trunk: Secondary | ICD-10-CM | POA: Diagnosis not present

## 2021-12-26 DIAGNOSIS — L570 Actinic keratosis: Secondary | ICD-10-CM | POA: Diagnosis not present

## 2021-12-26 DIAGNOSIS — D2262 Melanocytic nevi of left upper limb, including shoulder: Secondary | ICD-10-CM | POA: Diagnosis not present

## 2021-12-26 DIAGNOSIS — L720 Epidermal cyst: Secondary | ICD-10-CM | POA: Diagnosis not present

## 2021-12-26 DIAGNOSIS — D2372 Other benign neoplasm of skin of left lower limb, including hip: Secondary | ICD-10-CM | POA: Diagnosis not present

## 2021-12-26 DIAGNOSIS — D1801 Hemangioma of skin and subcutaneous tissue: Secondary | ICD-10-CM | POA: Diagnosis not present

## 2022-01-21 ENCOUNTER — Other Ambulatory Visit: Payer: Self-pay | Admitting: Family Medicine

## 2022-01-21 DIAGNOSIS — Z87828 Personal history of other (healed) physical injury and trauma: Secondary | ICD-10-CM

## 2022-01-23 ENCOUNTER — Other Ambulatory Visit: Payer: Self-pay | Admitting: Family Medicine

## 2022-01-23 DIAGNOSIS — Z8659 Personal history of other mental and behavioral disorders: Secondary | ICD-10-CM

## 2022-01-25 ENCOUNTER — Encounter: Payer: Self-pay | Admitting: Family Medicine

## 2022-02-15 IMAGING — MR MR CERVICAL SPINE W/O CM
4 of 5 series · 20 of 48 positions shown · non-contrast
Comparison: None.

CLINICAL DATA: Left arm pain. Tingling in the left hand after
lifting heavy object

EXAM:
MRI CERVICAL SPINE WITHOUT CONTRAST
TECHNIQUE: Multiplanar, multisequence MR imaging of the cervical spine was
performed. No intravenous contrast was administered.

[Series 2: T2 · sagittal · 3.0mm · 0.69mm/px · 6 of 13 slices shown (1 of 2)]
[im 1/13]
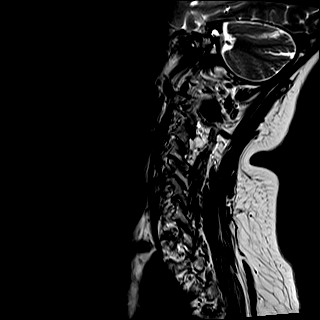
[im 3/13]
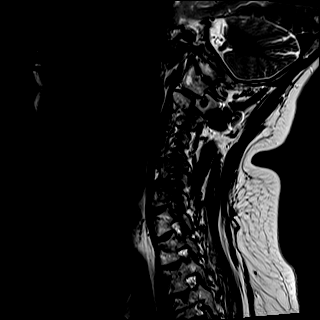
[im 5/13]
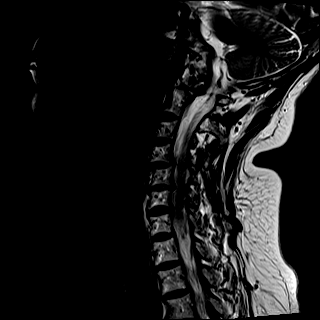
[im 8/13]
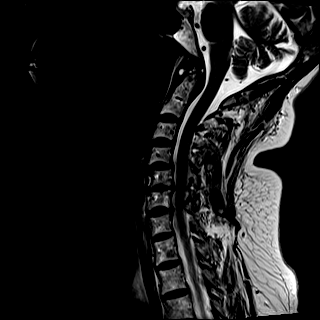
[im 10/13]
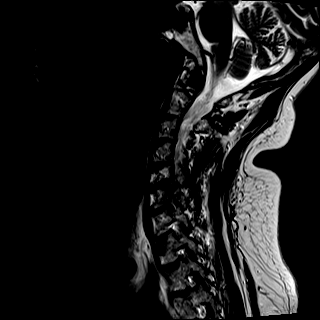
[im 13/13]
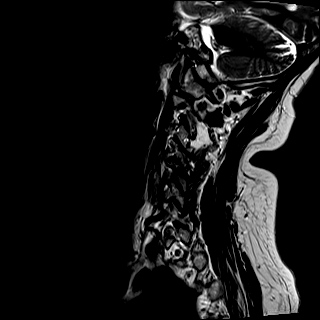

[Series 3: T1 · sagittal · 3.0mm · 0.86mm/px · 3 of 13 slices shown]
[im 3/13]
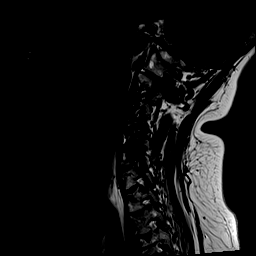
[im 8/13]
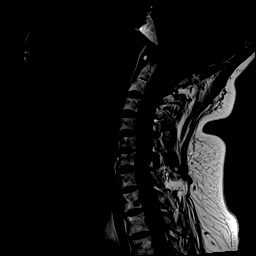
[im 13/13]
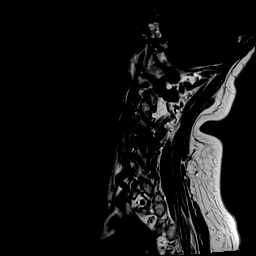

[Series 4: STIR · sagittal · 3.0mm · 0.34mm/px · 3 of 13 slices shown]
[im 3/13]
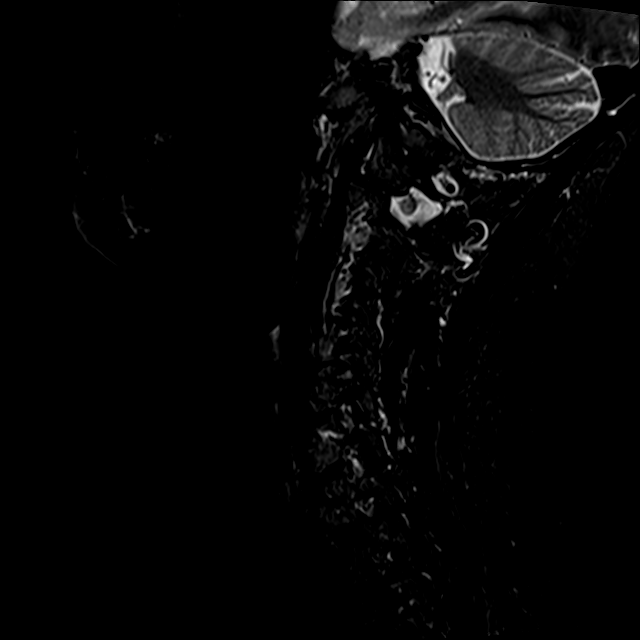
[im 8/13]
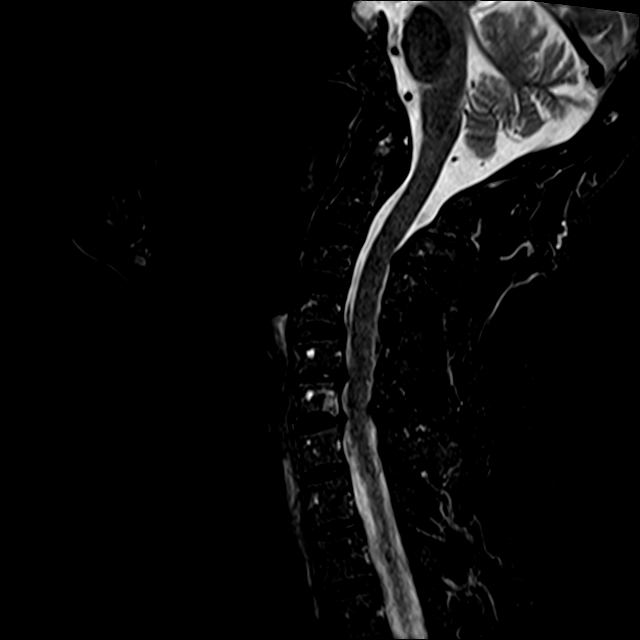
[im 13/13]
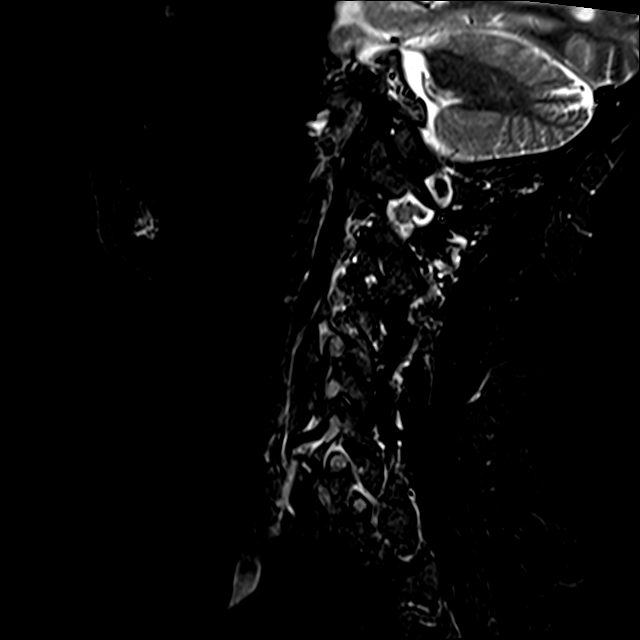

[Series 5: T2 · axial · 3.0mm · 0.28mm/px · z∈[-46,+47]mm · 8 of 32 slices shown (2 of 2)]
[im 1/32]
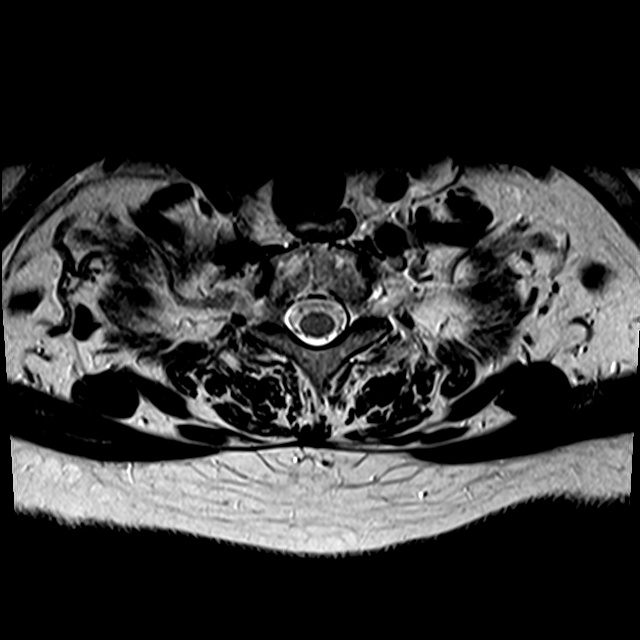
[im 5/32]
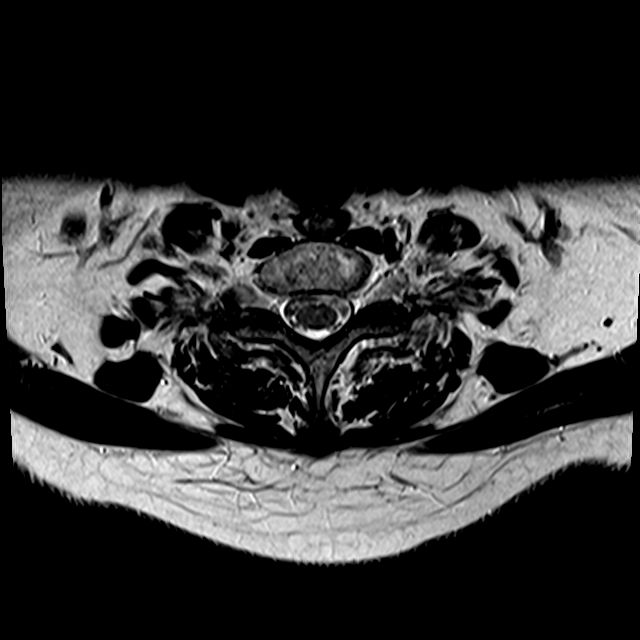
[im 9/32]
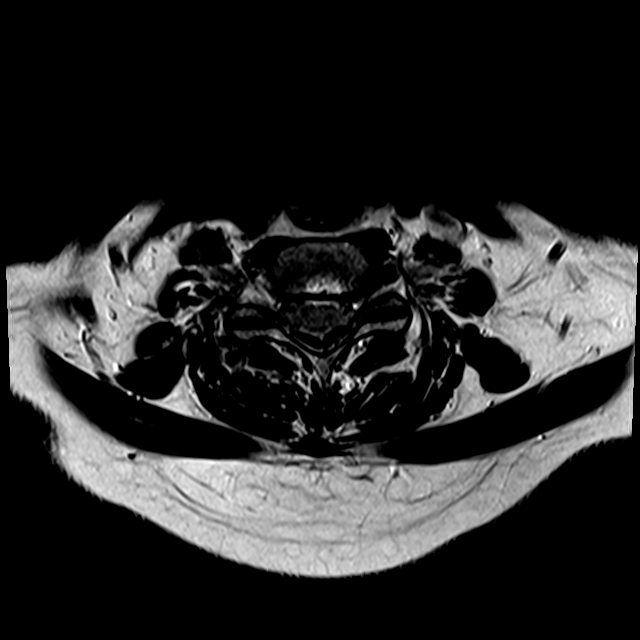
[im 14/32]
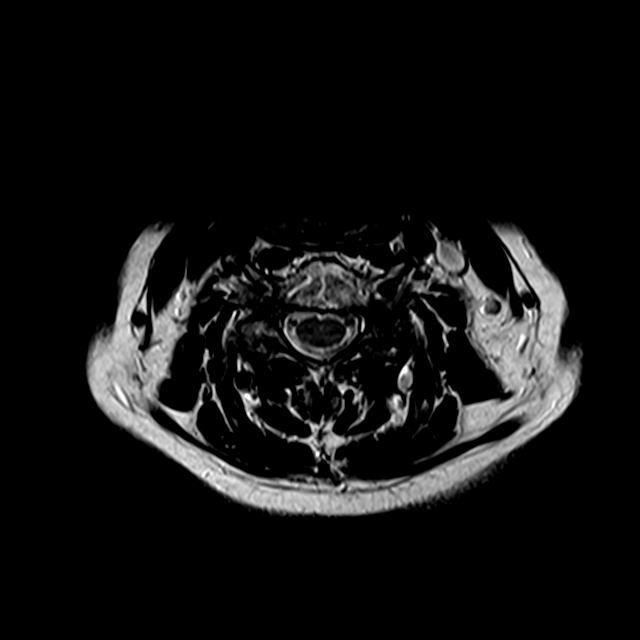
[im 16/32]
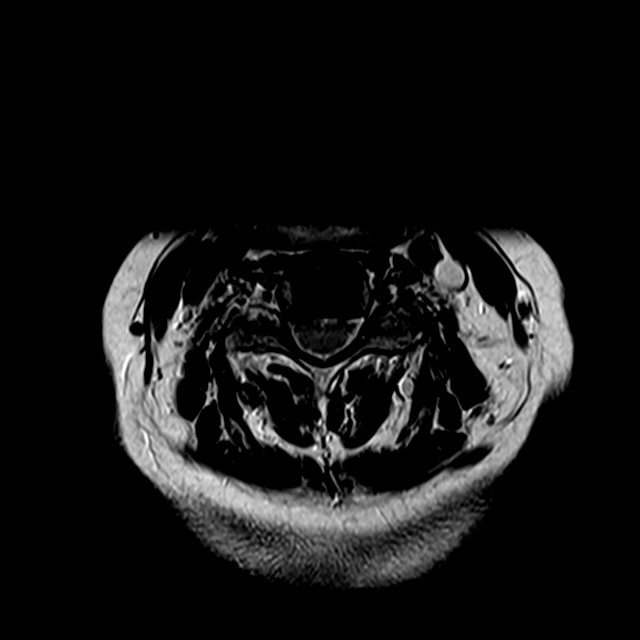
[im 18/32]
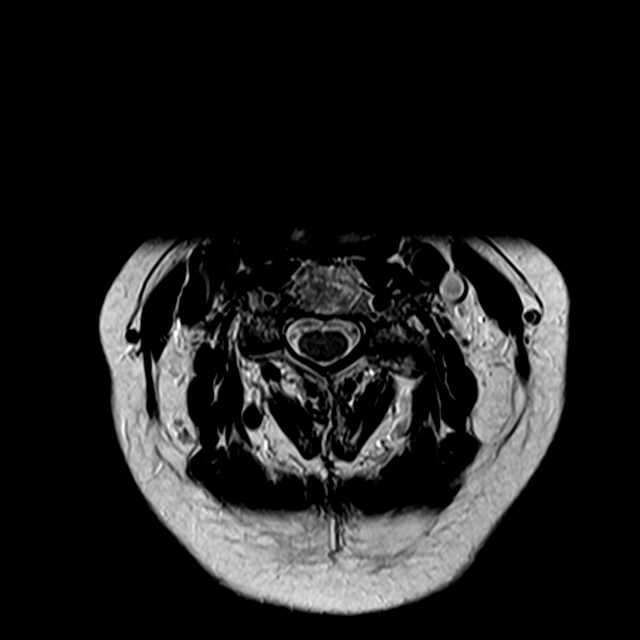
[im 23/32]
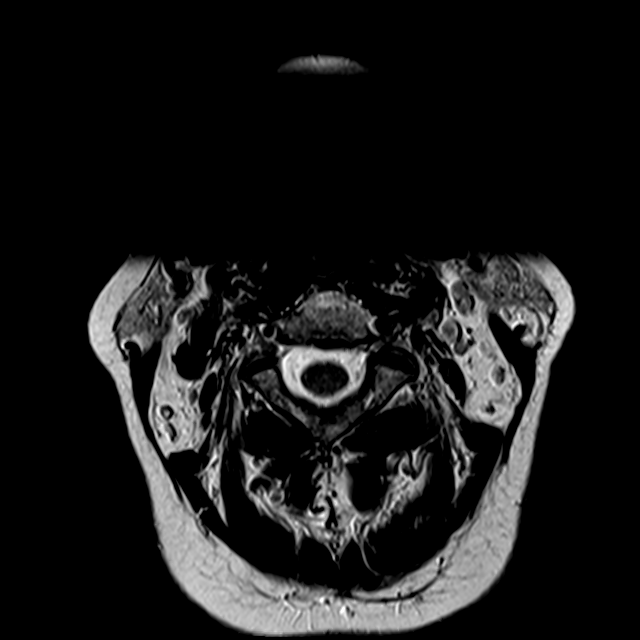
[im 27/32]
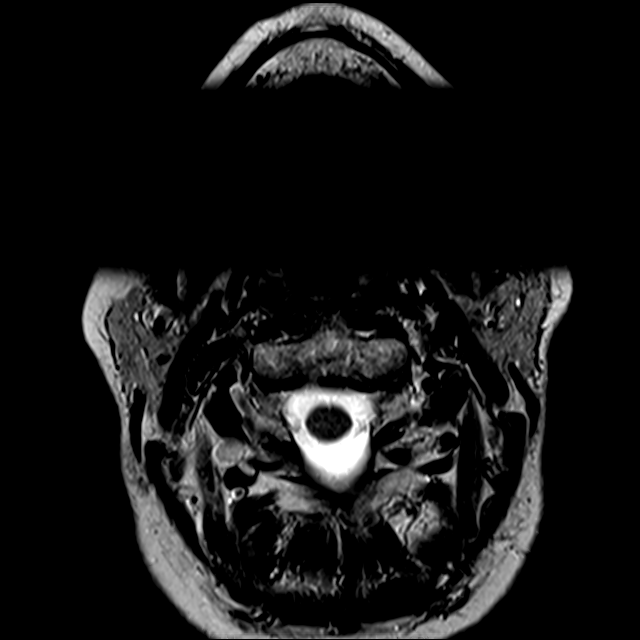

[20 of 48 positions shown; findings below may reference images not displayed]

FINDINGS: Alignment: 2 mm of anterolisthesis at C4-5

Vertebrae: No fracture, evidence of discitis, or bone lesion.

Cord: Normal signal and morphology.

Posterior Fossa, vertebral arteries, paraspinal tissues: Negative.

Disc levels:

C2-3: Mild left facet spurring

C3-4: Mild left facet spurring with moderate left foraminal
narrowing

C4-5: Disc narrowing with endplate and uncovertebral ridging. Facet
osteoarthritis with spurring and anterolisthesis. Biforaminal
impingement and mild spinal stenosis

C5-6: Disc narrowing and bulging with bilateral uncovertebral
ridging worse on the left. Bilateral foraminal impingement, severe
on the left. Mild spinal stenosis

C6-7: Disc narrowing and bulging with left foraminal impingement
from spur and bulge. Mild spinal stenosis.

C7-T1:Unremarkable
IMPRESSION: 1. Multilevel degeneration with C4-5 anterolisthesis.
2. Foraminal impingement on the left at C3-4 to C6-7 (especially at
C4-5 and C5-6) and on the right at C4-5 and C5-6.

## 2022-02-21 DIAGNOSIS — H2513 Age-related nuclear cataract, bilateral: Secondary | ICD-10-CM | POA: Diagnosis not present

## 2022-02-22 DIAGNOSIS — U071 COVID-19: Secondary | ICD-10-CM | POA: Diagnosis not present

## 2022-02-22 DIAGNOSIS — L570 Actinic keratosis: Secondary | ICD-10-CM | POA: Diagnosis not present

## 2022-02-24 ENCOUNTER — Other Ambulatory Visit: Payer: Self-pay | Admitting: Family Medicine

## 2022-02-24 DIAGNOSIS — Z8659 Personal history of other mental and behavioral disorders: Secondary | ICD-10-CM

## 2022-04-01 ENCOUNTER — Other Ambulatory Visit: Payer: Self-pay | Admitting: Family Medicine

## 2022-04-01 DIAGNOSIS — Z8659 Personal history of other mental and behavioral disorders: Secondary | ICD-10-CM

## 2022-04-03 ENCOUNTER — Other Ambulatory Visit: Payer: Self-pay | Admitting: Family Medicine

## 2022-04-03 DIAGNOSIS — Z8659 Personal history of other mental and behavioral disorders: Secondary | ICD-10-CM

## 2022-04-04 ENCOUNTER — Ambulatory Visit (INDEPENDENT_AMBULATORY_CARE_PROVIDER_SITE_OTHER): Payer: Medicare Other | Admitting: Family Medicine

## 2022-04-04 VITALS — BP 122/62 | HR 86 | Temp 98.1°F | Wt 155.4 lb

## 2022-04-04 DIAGNOSIS — K219 Gastro-esophageal reflux disease without esophagitis: Secondary | ICD-10-CM

## 2022-04-04 DIAGNOSIS — R1319 Other dysphagia: Secondary | ICD-10-CM | POA: Diagnosis not present

## 2022-04-04 DIAGNOSIS — K222 Esophageal obstruction: Secondary | ICD-10-CM

## 2022-04-04 DIAGNOSIS — Z8659 Personal history of other mental and behavioral disorders: Secondary | ICD-10-CM

## 2022-04-04 MED ORDER — OMEPRAZOLE 20 MG PO CPDR: 20.0000 mg | DELAYED_RELEASE_CAPSULE | Freq: Every day | ORAL | 3 refills | Status: DC

## 2022-04-04 MED ORDER — ESCITALOPRAM OXALATE 20 MG PO TABS: ORAL_TABLET | ORAL | 3 refills | Status: DC

## 2022-04-04 NOTE — Progress Notes (Signed)
Subjective:    Patient ID: [name], female    DOB: 1951-04-04, 71 y.o.   MRN: [id number]  Chief Complaint  Patient presents with   Medication Refill    HPI Patient is a 71 yo female with pmh sig for h/o depression, diverticulosis who was seen today for follow-up on chronic conditions.  Patient states she is doing well overall.  Had back surgery late 2022.  Has been hesitant to restart exercises as wanted spine to fully heal.  Patient states mood and energy are good.  Requesting refill on Lexapro 20 mg daily.  Patient notes history of dysphagia for most meats and with some liquids. Able to swallow chicken ok.  Had esophagus dilated by GI, but states it only lasted 8 months.  Pt notes family history of esophageal stricture.  Patient notes a.m. cough, hoarse voice, increased phlegm.  Previously told had signs of acid reflux from EGD, but did not take any meds as she did not have overt heartburn.  Past Medical History:  Diagnosis Date   ABDOMINAL PAIN 05/01/2010   DEPRESSION 07/21/2007   DIVERTICULOSIS, COLON 07/26/2008   Headache(784.0) 07/26/2008   Pityriasis 09/13/2019    Allergies  Allergen Reactions   Sulfur Hives   Sulfamethoxazole Hives    Happened in childhood, does not remember reaction.    ROS General: Denies fever, chills, night sweats, changes in weight, changes in appetite HEENT: Denies headaches, ear pain, changes in vision, rhinorrhea, sore throat  +dysphagia CV: Denies CP, palpitations, SOB, orthopnea Pulm: Denies SOB, cough, wheezing +cough/throat clearing GI: Denies abdominal pain, nausea, vomiting, diarrhea, constipation GU: Denies dysuria, hematuria, frequency, vaginal discharge Msk: Denies muscle cramps, joint pains Neuro: Denies weakness, numbness, tingling Skin: Denies rashes, bruising Psych: Denies depression, anxiety, hallucinations     Objective:    Blood pressure 122/62, pulse 86, temperature 98.1 F (36.7 C), temperature source Oral, weight 155 lb  6.4 oz (70.5 kg), SpO2 95 %. Body mass index is 25.86 kg/m.  Gen. Pleasant, well-nourished, in no distress, normal affect   HEENT: [place]/AT, face symmetric, conjunctiva clear, no scleral icterus, PERRLA, EOMI, nares patent without drainage, pharynx without erythema or exudate. Neck: No JVD, no thyromegaly, no carotid bruits Lungs: no accessory muscle use, CTAB, no wheezes or rales Cardiovascular: RRR, no m/r/g, no peripheral edema Abdomen: BS present, soft, NT/ND, no hepatosplenomegaly. Musculoskeletal: No deformities, no cyanosis or clubbing, normal tone Neuro:  A&Ox3, CN II-XII intact, normal gait Skin:  Warm, no lesions/ rash.  4 cm Lipoma R midline middle back above bra line.   Wt Readings from Last 3 Encounters:  04/04/22 155 lb 6.4 oz (70.5 kg)  09/13/21 164 lb (74.4 kg)  09/11/21 164 lb 6.4 oz (74.6 kg)    Lab Results  Component Value Date   WBC 4.9 09/11/2021   HGB 14.2 09/11/2021   HCT 42.6 09/11/2021   PLT 268 09/11/2021   GLUCOSE 98 07/23/2019   CHOL 151 07/23/2019   TRIG 92.0 07/23/2019   HDL 61.30 07/23/2019   LDLCALC 71 07/23/2019   ALT 10 08/08/2011   AST 19 08/08/2011   NA 142 07/23/2019   K 4.3 07/23/2019   CL 105 07/23/2019   CREATININE 0.86 07/23/2019   BUN 15 07/23/2019   CO2 29 07/23/2019   TSH 1.58 07/21/2008   INR 0.9 09/11/2021      04/04/2022    3:04 PM 06/28/2021    8:58 AM 10/19/2020    1:16 PM 06/22/2020    9:51  AM 10/16/2017    8:06 AM  Depression screen PHQ 2/9  Decreased Interest 0 0 3 0 0  Down, Depressed, Hopeless 0 0 0 0 0  PHQ - 2 Score 0 0 3 0 0  Altered sleeping 2  0    Tired, decreased energy 0  0    Change in appetite 0  0    Feeling bad or failure about yourself  0  0    Trouble concentrating 0  0    Moving slowly or fidgety/restless 0  0    Suicidal thoughts 0  0    PHQ-9 Score 2  3    Difficult doing work/chores Not difficult at all  Not difficult at all     Assessment/Plan:  GERD with stricture  -previously noted on  EGD -discussed starting trial of PPI, Omeprazole 20 mg daily -discussed foods to avoid as may increase symptoms -f/u with GI for repeat esophageal dilation - Plan: omeprazole (PRILOSEC) 20 MG capsule, Ambulatory referral to Gastroenterology  History of depression -in remission -PHQ 9 score 2 this visit -Continue Lexapro 20 mg daily -Consider weaning off Lexapro  - Plan: escitalopram (LEXAPRO) 20 MG tablet  Esophageal dysphagia  -s/p esophageal dilation -Discussed ways to improve swallowing when eating or drinking such as chin tuck -We will start PPI as acid may be causing worsening stricture - Plan: Ambulatory referral to Gastroenterology  F/u as needed in 1 month, sooner if needed  Grier Mitts, MD

## 2022-04-17 ENCOUNTER — Other Ambulatory Visit: Payer: Self-pay | Admitting: Family Medicine

## 2022-05-13 ENCOUNTER — Other Ambulatory Visit: Payer: Self-pay | Admitting: Family Medicine

## 2022-06-12 ENCOUNTER — Other Ambulatory Visit: Payer: Self-pay | Admitting: Family Medicine

## 2022-07-18 ENCOUNTER — Ambulatory Visit: Payer: Medicare Other

## 2022-07-18 ENCOUNTER — Ambulatory Visit (INDEPENDENT_AMBULATORY_CARE_PROVIDER_SITE_OTHER): Payer: Medicare Other

## 2022-07-18 ENCOUNTER — Other Ambulatory Visit: Payer: Self-pay | Admitting: Family Medicine

## 2022-07-18 VITALS — Ht 66.0 in | Wt 145.0 lb

## 2022-07-18 DIAGNOSIS — Z0000 Encounter for general adult medical examination without abnormal findings: Secondary | ICD-10-CM | POA: Diagnosis not present

## 2022-07-18 NOTE — Progress Notes (Signed)
Subjective:   NADRA HRITZ is a 71 y.o. female who presents for Medicare Annual (Subsequent) preventive examination.  Review of Systems    Virtual Visit via Telephone Note  I connected with  [name] on 07/18/22 at  8:45 AM EDT by telephone and verified that I am speaking with the correct person using two identifiers.  Location: Patient: Home Provider: Office Persons participating in the virtual visit: patient/Nurse Health Advisor   I discussed the limitations, risks, security and privacy concerns of performing an evaluation and management service by telephone and the availability of in person appointments. The patient expressed understanding and agreed to proceed.  Interactive audio and video telecommunications were attempted between this nurse and patient, however failed, due to patient having technical difficulties OR patient did not have access to video capability.  We continued and completed visit with audio only.  Some vital signs may be absent or patient reported.   Criselda Peaches, LPN  Cardiac Risk Factors include: advanced age (>62mn, >>8women)     Objective:    Today's Vitals   07/18/22 0846  Weight: 145 lb (65.8 kg)  Height: '5\' 6"'$  (1.676 m)   Body mass index is 23.4 kg/m.     07/18/2022    8:57 AM 09/13/2021    7:15 AM 09/11/2021   10:16 AM 06/28/2021    8:59 AM 06/22/2020    9:53 AM 05/18/2020   11:41 AM  Advanced Directives  Does Patient Have a Medical Advance Directive? Yes No No Yes No No  Type of [place] will       Does patient want to make changes to medical advance directive? No - Patient declined   Yes (MAU/Ambulatory/Procedural Areas - Information given) No - Patient declined   Copy of [place]? No - copy requested       Would patient like information on creating a medical advance directive?  No - Patient declined No - Patient declined       Current Medications  (verified) Outpatient Encounter Medications as of 07/18/2022  Medication Sig   acyclovir (ZOVIRAX) 200 MG capsule TAKE 1 CAPSULE BY MOUTH EVERY DAY   escitalopram (LEXAPRO) 20 MG tablet TAKE 1 TABLET BY MOUTH EVERY DAY IN THE MORNING   Multiple Vitamins-Minerals (MULTIVITAMIN GUMMIES ADULT PO) Take 1 tablet by mouth daily.   omeprazole (PRILOSEC) 20 MG capsule Take 1 capsule (20 mg total) by mouth daily.   No facility-administered encounter medications on file as of 07/18/2022.    Allergies (verified) Sulfur and Sulfamethoxazole   History: Past Medical History:  Diagnosis Date   ABDOMINAL PAIN 05/01/2010   DEPRESSION 07/21/2007   DIVERTICULOSIS, COLON 07/26/2008   Headache(784.0) 07/26/2008   Pityriasis 09/13/2019   Past Surgical History:  Procedure Laterality Date   COLONOSCOPY     ERCP  august 2012   LAPAROSCOPIC CHOLECYSTECTOMY  08/14/2011   WISDOM TOOTH EXTRACTION     Family History  Problem Relation Age of Onset   Diabetes Mother    Heart disease Mother    Obesity Mother    Dementia Mother    Heart disease Father    Stroke Father    Colon cancer Paternal Aunt    Other Paternal Aunt        esophageal stricture   Esophageal cancer Neg Hx    Rectal cancer Neg Hx    Stomach cancer Neg Hx    Social History  Socioeconomic History   Marital status: Married    Spouse name: Not on file   Number of children: Not on file   Years of education: Not on file   Highest education level: Associate degree: occupational, Hotel manager, or vocational program  Occupational History   Not on file  Tobacco Use   Smoking status: Never   Smokeless tobacco: Never  Vaping Use   Vaping Use: Never used  Substance and Sexual Activity   Alcohol use: No   Drug use: No   Sexual activity: Not on file  Other Topics Concern   Not on file  Social History Narrative   Not on file   Social Determinants of Health   Financial Resource Strain: Low Risk  (07/18/2022)   Overall Financial Resource  Strain (CARDIA)    Difficulty of Paying Living Expenses: Not hard at all  Food Insecurity: No Food Insecurity (07/18/2022)   Hunger Vital Sign    Worried About Running Out of Food in the Last Year: Never true    [place] in the Last Year: Never true  Transportation Needs: No Transportation Needs (07/18/2022)   PRAPARE - Hydrologist (Medical): No    Lack of Transportation (Non-Medical): No  Physical Activity: Insufficiently Active (07/18/2022)   Exercise Vital Sign    Days of Exercise per Week: 2 days    Minutes of Exercise per Session: 20 min  Stress: No Stress Concern Present (07/18/2022)   [place]    Feeling of Stress : Not at all  Social Connections: [place] (07/18/2022)   Social Connection and Isolation Panel [NHANES]    Frequency of Communication with Friends and Family: More than three times a week    Frequency of Social Gatherings with Friends and Family: More than three times a week    Attends Religious Services: More than 4 times per year    Active Member of [employer] or Organizations: Yes    Attends Music therapist: More than 4 times per year    Marital Status: Married    Tobacco Counseling Counseling given: Not Answered   Clinical Intake:  Pre-visit preparation completed: No  Pain : No/denies pain     BMI - recorded: 23.4 Nutritional Status: BMI of 19-24  Normal Nutritional Risks: None Diabetes: No  How often do you need to have someone help you when you read instructions, pamphlets, or other written materials from your doctor or pharmacy?: 1 - Never  Diabetic?  No  Interpreter Needed?: No  Information entered by :: Rolene Arbour LON   Activities of Daily Living    07/18/2022    8:55 AM 09/11/2021   10:18 AM  In your present state of health, do you have any difficulty performing the following activities:  Hearing? 0   Vision? 0    Difficulty concentrating or making decisions? 0   Walking or climbing stairs? 0   Dressing or bathing? 0   Doing errands, shopping? 0 0  Preparing Food and eating ? N   Using the Toilet? N   In the past six months, have you accidently leaked urine? N   Do you have problems with loss of bowel control? N   Managing your Medications? N   Managing your Finances? N   Housekeeping or managing your Housekeeping? N     Patient Care Team: Billie Ruddy, MD as PCP - General (Family Medicine) Erskine Emery  D, MD (Inactive) as Consulting Physician (Gastroenterology)  Indicate any recent Medical Services you may have received from other than [facility] providers in the past year (date may be approximate).     Assessment:   This is a routine wellness examination for [place].  Hearing/Vision screen Hearing Screening - Comments:: Denies hearing difficulties   Vision Screening - Comments:: Wears rx glasses - up to date with routine eye exams with  [facility]  Dietary issues and exercise activities discussed: Current Exercise Habits: Home exercise routine, Type of exercise: walking, Time (Minutes): 20, Frequency (Times/Week): 2, Weekly Exercise (Minutes/Week): 40, Intensity: Moderate, Exercise limited by: None identified   Goals Addressed               This Visit's Progress     Patient stated (pt-stated)        I want to walk more.       Depression Screen    07/18/2022    8:53 AM 07/18/2022    8:52 AM 04/04/2022    3:04 PM 06/28/2021    8:58 AM 10/19/2020    1:16 PM 06/22/2020    9:51 AM 10/16/2017    8:06 AM  PHQ 2/9 Scores  PHQ - 2 Score 0 0 0 0 3 0 0  PHQ- 9 Score   2  3      Fall Risk    07/18/2022    8:56 AM 04/04/2022    3:03 PM 04/04/2022    9:20 AM 06/28/2021    9:00 AM 06/22/2020    9:54 AM  Fall Risk   Falls in the past year? 0 0 0 0 1  Number falls in past yr: 0 0  0 1  Comment     foot hung up in blankets and fell  Injury with Fall? 0 0  0 0  Comment     already had  back issues,  Risk for fall due to : No Fall Risks No Fall Risks  Impaired vision Impaired balance/gait;Impaired mobility;Impaired vision  Follow up Falls prevention discussed Falls evaluation completed  Falls prevention discussed Falls prevention discussed    FALL RISK PREVENTION PERTAINING TO THE HOME:  Any stairs in or around the home? Yes  If so, are there any without handrails? No  Home free of loose throw rugs in walkways, pet beds, electrical cords, etc? Yes  Adequate lighting in your home to reduce risk of falls? Yes   ASSISTIVE DEVICES UTILIZED TO PREVENT FALLS:  Life alert? No  Use of a cane, walker or w/c? No  Grab bars in the bathroom? No  Shower chair or bench in shower? No  Elevated toilet seat or a handicapped toilet? No   TIMED UP AND GO:  Was the test performed? No . Audio Visit   Cognitive Function:        07/18/2022    8:57 AM 06/28/2021    9:21 AM 06/22/2020    9:59 AM  6CIT Screen  What Year? 0 points 0 points 0 points  What month? 0 points 0 points 0 points  What time? 0 points 0 points   Count back from 20 0 points 0 points 0 points  Months in reverse 0 points 0 points 0 points  Repeat phrase 0 points 0 points 0 points  Total Score 0 points 0 points     Immunizations Immunization History  Administered Date(s) Administered   Fluad Quad(high Dose 65+) 08/20/2017, 07/23/2019, 08/01/2020   Influenza Whole 08/24/2008, 08/03/2010  Influenza, High Dose Seasonal PF 07/10/2018   Influenza,inj,quad, With Preservative 10/09/2016   Influenza-Unspecified 10/20/2014, 07/25/2021   PFIZER(Purple Top)SARS-COV-2 Vaccination 12/27/2019, 01/19/2020, 08/26/2020   Pneumococcal Conjugate-13 08/20/2017   Pneumococcal Polysaccharide-23 07/23/2019   Tdap 02/12/2017    TDAP status: Up to date  Flu Vaccine status: Up to date  Pneumococcal vaccine status: Up to date  Covid-19 vaccine status: Completed vaccines  Qualifies for Shingles Vaccine? Yes   Zostavax  completed No   Shingrix Completed?: No.    Education has been provided regarding the importance of this vaccine. Patient has been advised to call insurance company to determine out of pocket expense if they have not yet received this vaccine. Advised may also receive vaccine at local pharmacy or Health Dept. Verbalized acceptance and understanding.  Screening Tests Health Maintenance  Topic Date Due   COVID-19 Vaccine (4 - Pfizer risk series) 08/03/2022 (Originally 10/21/2020)   Zoster Vaccines- Shingrix (1 of 2) 10/17/2022 (Originally 06/11/1970)   INFLUENZA VACCINE  02/10/2023 (Originally 06/12/2022)   MAMMOGRAM  09/07/2023   TETANUS/TDAP  02/13/2027   COLONOSCOPY (Pts 45-60yr Insurance coverage will need to be confirmed)  09/01/2029   Pneumonia Vaccine 70 Years old  Completed   DEXA SCAN  Completed   Hepatitis C Screening  Completed   HPV VACCINES  Aged Out    Health Maintenance  There are no preventive care reminders to display for this patient.   Colorectal cancer screening: Type of screening: Colonoscopy. Completed 09/02/19. Repeat every 10 years  Mammogram status: Completed 09/06/21. Repeat every year  Bone Density status: Completed 01/12/20. Results reflect: Bone density results: OSTEOPOROSIS. Repeat every   years.  Lung Cancer Screening: (Low Dose CT Chest recommended if Age 70-80years, 30 pack-year currently smoking OR have quit w/in 15years.) does not qualify.    Additional Screening:  Hepatitis C Screening: does qualify; Completed 07/23/19  Vision Screening: Recommended annual ophthalmology exams for early detection of glaucoma and other disorders of the eye. Is the patient up to date with their annual eye exam?  Yes  Who is the provider or what is the name of the office in which the patient attends annual eye exams? [facility] If pt is not established with a provider, would they like to be referred to a provider to establish care? No .   Dental Screening:  Recommended annual dental exams for proper oral hygiene  Community Resource Referral / Chronic Care Management:  CRR required this visit?  No   CCM required this visit?  No      Plan:     I have personally reviewed and noted the following in the patient's chart:   Medical and social history Use of alcohol, tobacco or illicit drugs  Current medications and supplements including opioid prescriptions. Patient is not currently taking opioid prescriptions. Functional ability and status Nutritional status Physical activity Advanced directives List of other physicians Hospitalizations, surgeries, and ER visits in previous 12 months Vitals Screenings to include cognitive, depression, and falls Referrals and appointments  In addition, I have reviewed and discussed with patient certain preventive protocols, quality metrics, and best practice recommendations. A written personalized care plan for preventive services as well as general preventive health recommendations were provided to patient.     BCriselda Peaches LPN   95[birth year]85  Nurse Notes: None

## 2022-07-18 NOTE — Patient Instructions (Addendum)
Ms. Buttacavoli , Thank you for taking time to come for your Medicare Wellness Visit. I appreciate your ongoing commitment to your health goals. Please review the following plan we discussed and let me know if I can assist you in the future.   Screening recommendations/referrals: Colonoscopy: Done 09/02/19 Repeat 10 yrs Mammogram: Done 09/06/21 Repeat 2 yrs Bone Density: Done  Recommended yearly ophthalmology/optometry visit for glaucoma screening and checkup Recommended yearly dental visit for hygiene and checkup  Vaccinations: Influenza vaccine: Due Pneumococcal vaccine: Up to date Tdap vaccine: Up to date Shingles vaccine: Deferred   Covid-19:Done  Advanced directives: Please bring a copy of your health care power of attorney and living will to the office to be added to your chart at your convenience.   Conditions/risks identified: None  Next appointment: Follow up in one year for your annual wellness visit     Preventive Care 65 Years and Older, Female Preventive care refers to lifestyle choices and visits with your health care provider that can promote health and wellness. What does preventive care include? A yearly physical exam. This is also called an annual well check. Dental exams once or twice a year. Routine eye exams. Ask your health care provider how often you should have your eyes checked. Personal lifestyle choices, including: Daily care of your teeth and gums. Regular physical activity. Eating a healthy diet. Avoiding tobacco and drug use. Limiting alcohol use. Practicing safe sex. Taking low-dose aspirin every day. Taking vitamin and mineral supplements as recommended by your health care provider. What happens during an annual well check? The services and screenings done by your health care provider during your annual well check will depend on your age, overall health, lifestyle risk factors, and family history of disease. Counseling  Your health care provider  may ask you questions about your: Alcohol use. Tobacco use. Drug use. Emotional well-being. Home and relationship well-being. Sexual activity. Eating habits. History of falls. Memory and ability to understand (cognition). Work and work Statistician. Reproductive health. Screening  You may have the following tests or measurements: Height, weight, and BMI. Blood pressure. Lipid and cholesterol levels. These may be checked every 5 years, or more frequently if you are over 25 years old. Skin check. Lung cancer screening. You may have this screening every year starting at age 38 if you have a 30-pack-year history of smoking and currently smoke or have quit within the past 15 years. Fecal occult blood test (FOBT) of the stool. You may have this test every year starting at age 49. Flexible sigmoidoscopy or colonoscopy. You may have a sigmoidoscopy every 5 years or a colonoscopy every 10 years starting at age 36. Hepatitis C blood test. Hepatitis B blood test. Sexually transmitted disease (STD) testing. Diabetes screening. This is done by checking your blood sugar (glucose) after you have not eaten for a while (fasting). You may have this done every 1-3 years. Bone density scan. This is done to screen for osteoporosis. You may have this done starting at age 56. Mammogram. This may be done every 1-2 years. Talk to your health care provider about how often you should have regular mammograms. Talk with your health care provider about your test results, treatment options, and if necessary, the need for more tests. Vaccines  Your health care provider may recommend certain vaccines, such as: Influenza vaccine. This is recommended every year. Tetanus, diphtheria, and acellular pertussis (Tdap, Td) vaccine. You may need a Td booster every 10 years. Zoster vaccine. You may  need this after age 25. Pneumococcal 13-valent conjugate (PCV13) vaccine. One dose is recommended after age 50. Pneumococcal  polysaccharide (PPSV23) vaccine. One dose is recommended after age 44. Talk to your health care provider about which screenings and vaccines you need and how often you need them. This information is not intended to replace advice given to you by your health care provider. Make sure you discuss any questions you have with your health care provider. Document Released: 11/25/2015 Document Revised: 07/18/2016 Document Reviewed: 08/30/2015 Elsevier Interactive Patient Education  2017 Elim Prevention in the Home Falls can cause injuries. They can happen to people of all ages. There are many things you can do to make your home safe and to help prevent falls. What can I do on the outside of my home? Regularly fix the edges of walkways and driveways and fix any cracks. Remove anything that might make you trip as you walk through a door, such as a raised step or threshold. Trim any bushes or trees on the path to your home. Use bright outdoor lighting. Clear any walking paths of anything that might make someone trip, such as rocks or tools. Regularly check to see if handrails are loose or broken. Make sure that both sides of any steps have handrails. Any raised decks and porches should have guardrails on the edges. Have any leaves, snow, or ice cleared regularly. Use sand or salt on walking paths during winter. Clean up any spills in your garage right away. This includes oil or grease spills. What can I do in the bathroom? Use night lights. Install grab bars by the toilet and in the tub and shower. Do not use towel bars as grab bars. Use non-skid mats or decals in the tub or shower. If you need to sit down in the shower, use a plastic, non-slip stool. Keep the floor dry. Clean up any water that spills on the floor as soon as it happens. Remove soap buildup in the tub or shower regularly. Attach bath mats securely with double-sided non-slip rug tape. Do not have throw rugs and other  things on the floor that can make you trip. What can I do in the bedroom? Use night lights. Make sure that you have a light by your bed that is easy to reach. Do not use any sheets or blankets that are too big for your bed. They should not hang down onto the floor. Have a firm chair that has side arms. You can use this for support while you get dressed. Do not have throw rugs and other things on the floor that can make you trip. What can I do in the kitchen? Clean up any spills right away. Avoid walking on wet floors. Keep items that you use a lot in easy-to-reach places. If you need to reach something above you, use a strong step stool that has a grab bar. Keep electrical cords out of the way. Do not use floor polish or wax that makes floors slippery. If you must use wax, use non-skid floor wax. Do not have throw rugs and other things on the floor that can make you trip. What can I do with my stairs? Do not leave any items on the stairs. Make sure that there are handrails on both sides of the stairs and use them. Fix handrails that are broken or loose. Make sure that handrails are as long as the stairways. Check any carpeting to make sure that it is firmly attached  to the stairs. Fix any carpet that is loose or worn. Avoid having throw rugs at the top or bottom of the stairs. If you do have throw rugs, attach them to the floor with carpet tape. Make sure that you have a light switch at the top of the stairs and the bottom of the stairs. If you do not have them, ask someone to add them for you. What else can I do to help prevent falls? Wear shoes that: Do not have high heels. Have rubber bottoms. Are comfortable and fit you well. Are closed at the toe. Do not wear sandals. If you use a stepladder: Make sure that it is fully opened. Do not climb a closed stepladder. Make sure that both sides of the stepladder are locked into place. Ask someone to hold it for you, if possible. Clearly  mark and make sure that you can see: Any grab bars or handrails. First and last steps. Where the edge of each step is. Use tools that help you move around (mobility aids) if they are needed. These include: Canes. Walkers. Scooters. Crutches. Turn on the lights when you go into a dark area. Replace any light bulbs as soon as they burn out. Set up your furniture so you have a clear path. Avoid moving your furniture around. If any of your floors are uneven, fix them. If there are any pets around you, be aware of where they are. Review your medicines with your doctor. Some medicines can make you feel dizzy. This can increase your chance of falling. Ask your doctor what other things that you can do to help prevent falls. This information is not intended to replace advice given to you by your health care provider. Make sure you discuss any questions you have with your health care provider. Document Released: 08/25/2009 Document Revised: 04/05/2016 Document Reviewed: 12/03/2014 Elsevier Interactive Patient Education  2017 [employer].

## 2022-07-24 ENCOUNTER — Encounter: Payer: Self-pay | Admitting: Family Medicine

## 2022-07-24 DIAGNOSIS — Z23 Encounter for immunization: Secondary | ICD-10-CM | POA: Diagnosis not present

## 2022-07-30 ENCOUNTER — Other Ambulatory Visit: Payer: Self-pay | Admitting: Family Medicine

## 2022-07-30 DIAGNOSIS — K219 Gastro-esophageal reflux disease without esophagitis: Secondary | ICD-10-CM

## 2022-10-01 ENCOUNTER — Telehealth: Payer: Self-pay

## 2022-10-01 ENCOUNTER — Ambulatory Visit (INDEPENDENT_AMBULATORY_CARE_PROVIDER_SITE_OTHER): Payer: Medicare Other | Admitting: Family Medicine

## 2022-10-01 ENCOUNTER — Encounter: Payer: Self-pay | Admitting: Family Medicine

## 2022-10-01 VITALS — BP 116/82 | HR 62 | Temp 98.8°F | Wt 147.0 lb

## 2022-10-01 DIAGNOSIS — R1010 Upper abdominal pain, unspecified: Secondary | ICD-10-CM | POA: Diagnosis not present

## 2022-10-01 DIAGNOSIS — K219 Gastro-esophageal reflux disease without esophagitis: Secondary | ICD-10-CM

## 2022-10-01 DIAGNOSIS — K222 Esophageal obstruction: Secondary | ICD-10-CM

## 2022-10-01 LAB — CBC WITH DIFFERENTIAL/PLATELET
Basophils Absolute: 0 10*3/uL (ref 0.0–0.1)
Basophils Relative: 0.9 % (ref 0.0–3.0)
Eosinophils Absolute: 0.2 10*3/uL (ref 0.0–0.7)
Eosinophils Relative: 3.8 % (ref 0.0–5.0)
HCT: 39 % (ref 36.0–46.0)
Hemoglobin: 13.6 g/dL (ref 12.0–15.0)
Lymphocytes Relative: 28.6 % (ref 12.0–46.0)
Lymphs Abs: 1.2 10*3/uL (ref 0.7–4.0)
MCHC: 34.9 g/dL (ref 30.0–36.0)
MCV: 90.6 fl (ref 78.0–100.0)
Monocytes Absolute: 0.3 10*3/uL (ref 0.1–1.0)
Monocytes Relative: 6.6 % (ref 3.0–12.0)
Neutro Abs: 2.5 10*3/uL (ref 1.4–7.7)
Neutrophils Relative %: 60.1 % (ref 43.0–77.0)
Platelets: 271 10*3/uL (ref 150.0–400.0)
RBC: 4.31 Mil/uL (ref 3.87–5.11)
RDW: 12.9 % (ref 11.5–15.5)
WBC: 4.2 10*3/uL (ref 4.0–10.5)

## 2022-10-01 NOTE — Patient Instructions (Signed)
We will obtain labs to rule out any acute process like pancreatitis, though less likely. Continue acid reflux medications. Keep food diary For continued symptoms schedule follow-up with GI

## 2022-10-01 NOTE — Telephone Encounter (Signed)
--  Caller states she has been having abdominal pain (not now, lasted about mins) that happens after eating, goes away w/in the hour; first noted 1 week ago this Friday & again last night. States she has also been having nausea & sweating (at that same time, not now). Denies fever or any other symptoms.  10/01/2022 10:05:53 AM See PCP within 24 Hours Cazares, RN, [employer]ie  Referrals REFERRED TO PCP OFFICE  Pt has appt with PCP today.

## 2022-10-01 NOTE — Progress Notes (Signed)
Subjective:    Patient ID: [name], female    DOB: [birth year], 71 y.o.   MRN: [id number]  Chief Complaint  Patient presents with   GI Problem    11/3 got some food sample from the grocery store, felt horrible, felt like sparklers all over, sweating, just felt bad, nausea. About an hour later sweating stopped, but had felt the other symptoms for 2 days. Had same sx yesterday, but not as severe as 2 wks ago. States food ususally does not bother. The pain was in her stomach, all across. Endoscoy revealed linning is thinning.     HPI  Patient is a 71 year old female with pmh sig for h/o lumbar fusion,h/o depression, s/p lap chole 08/14/2011 who was seen today for acute concerns.  Pt had 2 episodes of epigastric pain across top of abdomen lasting a few seconds but felt like "sparklers of pain".  First episode occurred after eating rice and honey chicken from food bar at local grocery store.  Patient developed diaphoresis and nausea.  Subsequently felt weak x3 days after the episode.  The latest episode happened yesterday with pain and nausea x1 hour after eating pad [place].  Pt had a few sips of Pepsi during both times.  Patient denies eating spicy foods, diarrhea, flatus, or belching.  Past Medical History:  Diagnosis Date   ABDOMINAL PAIN 05/01/2010   DEPRESSION 07/21/2007   DIVERTICULOSIS, COLON 07/26/2008   Headache(784.0) 07/26/2008   Pityriasis 09/13/2019    Allergies  Allergen Reactions   Sulfur Hives   Sulfamethoxazole Hives    Happened in childhood, does not remember reaction.    ROS General: Denies fever, chills, night sweats, changes in weight, changes in appetite HEENT: Denies headaches, ear pain, changes in vision, rhinorrhea, sore throat CV: Denies CP, palpitations, SOB, orthopnea Pulm: Denies SOB, cough, wheezing GI: Denies vomiting, diarrhea, constipation +abd pain, nausea GU: Denies dysuria, hematuria, frequency, vaginal discharge Msk: Denies muscle cramps, joint pains  +tightness in R back Neuro: Denies weakness, numbness, tingling Skin: Denies rashes, bruising Psych: Denies depression, anxiety, hallucinations     Objective:    Blood pressure 116/82, pulse 62, temperature 98.8 F (37.1 C), temperature source Oral, weight 147 lb (66.7 kg), SpO2 96 %.   Gen. Pleasant, well-nourished, in no distress, normal affect   HEENT: [place]/AT, face symmetric, conjunctiva clear, no scleral icterus, PERRLA, EOMI, nares patent without drainage Lungs: no accessory muscle use, CTAB, no wheezes or rales Cardiovascular: RRR, no m/r/g, no peripheral edema Abdomen: BS present, soft, NT/ND, no hepatosplenomegaly. Musculoskeletal: No TTP of cervical, thoracic, lumbar spine or paraspinal muscles.  No deformities, no cyanosis or clubbing, normal tone Neuro:  A&Ox3, CN II-XII intact, normal gait Skin:  Warm, no lesions/ rash   Wt Readings from Last 3 Encounters:  10/01/22 147 lb (66.7 kg)  07/18/22 145 lb (65.8 kg)  04/04/22 155 lb 6.4 oz (70.5 kg)    Lab Results  Component Value Date   WBC 4.9 09/11/2021   HGB 14.2 09/11/2021   HCT 42.6 09/11/2021   PLT 268 09/11/2021   GLUCOSE 98 07/23/2019   CHOL 151 07/23/2019   TRIG 92.0 07/23/2019   HDL 61.30 07/23/2019   LDLCALC 71 07/23/2019   ALT 10 08/08/2011   AST 19 08/08/2011   NA 142 07/23/2019   K 4.3 07/23/2019   CL 105 07/23/2019   CREATININE 0.86 07/23/2019   BUN 15 07/23/2019   CO2 29 07/23/2019   TSH 1.58 07/21/2008   INR 0.9  09/11/2021    Assessment/Plan:  Pain of upper abdomen - Plan: CMP, CBC with Differential/Platelet, Lipase  GERD with stricture  Unclear if recent episodes of epigastric abdominal pain due to increased gas.  Also consider pancreatitis or  diverticulitis but again duration of symptoms makes this cause less likely.  Cholecystitis less likely as patient is s/p cholecystectomy in 2012.   Patient advised to keep food diary.  Possible acid reflux acutely worsened with certain foods.   Continue PPI, omeprazole 20 mg daily.  For continued or worsening symptoms pt schedule appointment with GI.  We will obtain labs this visit.  Given strict precautions.  F/u as needed  Grier Mitts, MD

## 2022-10-02 LAB — COMPREHENSIVE METABOLIC PANEL
ALT: 307 U/L — ABNORMAL HIGH (ref 0–35)
AST: 345 U/L — ABNORMAL HIGH (ref 0–37)
Albumin: 4.6 g/dL (ref 3.5–5.2)
Alkaline Phosphatase: 150 U/L — ABNORMAL HIGH (ref 39–117)
BUN: 17 mg/dL (ref 6–23)
CO2: 26 mEq/L (ref 19–32)
Calcium: 9.6 mg/dL (ref 8.4–10.5)
Chloride: 106 mEq/L (ref 96–112)
Creatinine, Ser: 0.67 mg/dL (ref 0.40–1.20)
GFR: 88.63 mL/min (ref >60.00)
Glucose, Bld: 84 mg/dL (ref 70–99)
Potassium: 3.6 mEq/L (ref 3.5–5.1)
Sodium: 143 mEq/L (ref 135–145)
Total Bilirubin: 1.3 mg/dL — ABNORMAL HIGH (ref 0.2–1.2)
Total Protein: 6.6 g/dL (ref 6.0–8.3)

## 2022-10-02 LAB — LIPASE: Lipase: 18 U/L (ref 11.0–59.0)

## 2022-10-08 ENCOUNTER — Other Ambulatory Visit: Payer: Self-pay | Admitting: Family Medicine

## 2022-10-08 ENCOUNTER — Encounter: Payer: Self-pay | Admitting: Family Medicine

## 2022-10-08 DIAGNOSIS — K7689 Other specified diseases of liver: Secondary | ICD-10-CM

## 2022-10-08 DIAGNOSIS — Z9049 Acquired absence of other specified parts of digestive tract: Secondary | ICD-10-CM

## 2022-10-08 DIAGNOSIS — R748 Abnormal levels of other serum enzymes: Secondary | ICD-10-CM

## 2022-10-08 DIAGNOSIS — R7989 Other specified abnormal findings of blood chemistry: Secondary | ICD-10-CM

## 2022-10-09 ENCOUNTER — Ambulatory Visit
Admission: RE | Admit: 2022-10-09 | Discharge: 2022-10-09 | Disposition: A | Discharge status: Home / Self Care | Payer: Medicare Other | Source: Ambulatory Visit | Attending: Family Medicine | Admitting: Family Medicine

## 2022-10-09 DIAGNOSIS — K7689 Other specified diseases of liver: Secondary | ICD-10-CM | POA: Diagnosis not present

## 2022-10-09 DIAGNOSIS — K449 Diaphragmatic hernia without obstruction or gangrene: Secondary | ICD-10-CM | POA: Diagnosis not present

## 2022-10-09 DIAGNOSIS — R748 Abnormal levels of other serum enzymes: Secondary | ICD-10-CM

## 2022-10-09 DIAGNOSIS — R1011 Right upper quadrant pain: Secondary | ICD-10-CM | POA: Diagnosis not present

## 2022-10-09 DIAGNOSIS — R1110 Vomiting, unspecified: Secondary | ICD-10-CM | POA: Diagnosis not present

## 2022-10-09 DIAGNOSIS — R7989 Other specified abnormal findings of blood chemistry: Secondary | ICD-10-CM

## 2022-10-09 DIAGNOSIS — Z9049 Acquired absence of other specified parts of digestive tract: Secondary | ICD-10-CM

## 2022-10-09 MED ORDER — IOPAMIDOL (ISOVUE-300) INJECTION 61%
100.0000 mL | Freq: Once | INTRAVENOUS | Status: AC | PRN
  Administered 2022-10-09: 100 mL via INTRAVENOUS

## 2022-10-11 ENCOUNTER — Other Ambulatory Visit: Payer: Self-pay | Admitting: Family Medicine

## 2022-10-11 ENCOUNTER — Encounter: Payer: Self-pay | Admitting: Family Medicine

## 2022-10-11 DIAGNOSIS — K449 Diaphragmatic hernia without obstruction or gangrene: Secondary | ICD-10-CM

## 2022-10-11 DIAGNOSIS — K7689 Other specified diseases of liver: Secondary | ICD-10-CM

## 2022-10-26 DIAGNOSIS — R1013 Epigastric pain: Secondary | ICD-10-CM | POA: Diagnosis not present

## 2022-10-26 DIAGNOSIS — R7989 Other specified abnormal findings of blood chemistry: Secondary | ICD-10-CM | POA: Diagnosis not present

## 2022-10-29 ENCOUNTER — Telehealth: Payer: Self-pay | Admitting: Gastroenterology

## 2022-10-29 NOTE — Telephone Encounter (Signed)
Inbound call from patient stating that she has been having abdominal pain. Patient stated that it has been about a month since it stared and even eating simple things like apple sauce, and soups. Patient stated that she would like to discuss with the nurse and to see if she can get a sooner appointment than 11/26/2022 11:00. Please advise.

## 2022-10-29 NOTE — Telephone Encounter (Signed)
Spoke with the patient. Patient states she started having symptoms about a month ago. She was sent to a surgeon about her liver cyst and abdominal pain. She was told she needed to see her GI. Patient describes a sensation of fullness, "lots of tiny pains" "fire crackers inside." She is taking Omeprazole daily and does feel this has helped. She has recently started Mylanta which she takes sometimes after her meals and sometimes before or both. She feels this has helped. Her diet does not have any spicy or rich foods.  She agrees to an appointment on 11/14/22 with Dr Silverio Decamp. She will continue her present treatments.

## 2022-10-30 ENCOUNTER — Telehealth: Payer: Self-pay | Admitting: Family Medicine

## 2022-10-30 ENCOUNTER — Encounter (HOSPITAL_COMMUNITY): Payer: Self-pay

## 2022-10-30 ENCOUNTER — Observation Stay: Admit: 2022-10-30 | Payer: Medicare Other | Admitting: Surgery

## 2022-10-30 DIAGNOSIS — R7989 Other specified abnormal findings of blood chemistry: Secondary | ICD-10-CM | POA: Diagnosis not present

## 2022-10-30 NOTE — Telephone Encounter (Signed)
Requests a call regarding this patient and the referral

## 2022-10-31 NOTE — Telephone Encounter (Signed)
Spoke to [employer]. She inform the patient was seen with her regarding to the referral for liver cyst. She states her evaluation was normal and that nothing is related to the liver and thinks it is something else is going on. She has patient to see her GI.

## 2022-11-14 ENCOUNTER — Encounter: Payer: Self-pay | Admitting: Gastroenterology

## 2022-11-14 ENCOUNTER — Other Ambulatory Visit (INDEPENDENT_AMBULATORY_CARE_PROVIDER_SITE_OTHER): Payer: Medicare Other

## 2022-11-14 ENCOUNTER — Ambulatory Visit (INDEPENDENT_AMBULATORY_CARE_PROVIDER_SITE_OTHER): Payer: Medicare Other | Admitting: Gastroenterology

## 2022-11-14 VITALS — BP 122/70 | HR 76 | Ht 65.0 in | Wt 144.6 lb

## 2022-11-14 DIAGNOSIS — K7689 Other specified diseases of liver: Secondary | ICD-10-CM

## 2022-11-14 DIAGNOSIS — R7989 Other specified abnormal findings of blood chemistry: Secondary | ICD-10-CM | POA: Diagnosis not present

## 2022-11-14 DIAGNOSIS — K838 Other specified diseases of biliary tract: Secondary | ICD-10-CM

## 2022-11-14 LAB — COMPREHENSIVE METABOLIC PANEL
ALT: 19 U/L (ref 0–35)
AST: 23 U/L (ref 0–37)
Albumin: 4.6 g/dL (ref 3.5–5.2)
Alkaline Phosphatase: 127 U/L — ABNORMAL HIGH (ref 39–117)
BUN: 15 mg/dL (ref 6–23)
CO2: 28 mEq/L (ref 19–32)
Calcium: 9.6 mg/dL (ref 8.4–10.5)
Chloride: 105 mEq/L (ref 96–112)
Creatinine, Ser: 0.74 mg/dL (ref 0.40–1.20)
GFR: 81.97 mL/min (ref >60.00)
Glucose, Bld: 91 mg/dL (ref 70–99)
Potassium: 3.9 mEq/L (ref 3.5–5.1)
Sodium: 143 mEq/L (ref 135–145)
Total Bilirubin: 1.4 mg/dL — ABNORMAL HIGH (ref 0.2–1.2)
Total Protein: 6.6 g/dL (ref 6.0–8.3)

## 2022-11-14 LAB — C-REACTIVE PROTEIN: CRP: <1 mg/dL (ref 0.5–20.0)

## 2022-11-14 LAB — SEDIMENTATION RATE: Sed Rate: 6 mm/hr (ref 0–30)

## 2022-11-14 NOTE — Progress Notes (Unsigned)
[name]    [id number]    [birth year]  Primary Care Physician:Banks, Langley Adie, MD  Referring Physician: Billie Ruddy, MD [place],  [place] [address]   Chief complaint:  Abnormal LFT, liver cysts  HPI: 72 year old very pleasant female here for follow-up visit for abnormal LFT.  She was experiencing upper abdominal discomfort, underwent CT abdomen pelvis which showed multiple hepatic status and pneumobilia.  She is s/p cholecystectomy with sphincterotomy    Row Labels Latest Ref Rng & Units 11/14/2022   11:46 AM 10/01/2022    2:43 PM 08/08/2011   10:45 AM  Hepatic Function   Section Header. No data exists in this row.      Total Protein   6.0 - 8.3 g/dL 6.6  6.6  7.1   Albumin   3.5 - 5.2 g/dL 4.6  4.6  4.3   AST   0 - 37 U/L 23  345  19   ALT   0 - 35 U/L 19  307  10   Alk Phosphatase   39 - 117 U/L 127  150  91   Total Bilirubin   0.2 - 1.2 mg/dL 1.4  1.3  0.8     Denies any recent antibiotic use or medication changes.  She takes daily turmeric and also herbal multivitamin  Abdominal pain has since resolved.  Denies any vomiting, melena or rectal bleeding. She is taking daily omeprazole and no longer has urge to constantly clear her throat, she never experienced typical heartburn or GERD symptoms  CT abd & pelvis 10/09/22 1. No acute abdominal findings, mass lesions or adenopathy. 2. Moderate sized hiatal hernia. 3. Status post cholecystectomy and sphincterotomy with pneumobilia. No biliary dilatation. 4. Simple appearing hepatic cysts  EGD 11/19/19 - Benign-appearing esophageal stenosis. Dilated. - 5 cm hiatal hernia. - Gastritis. Biopsied. - Normal examined duodenum.  Colonoscopy 09/02/19 - Diverticulosis in the sigmoid colon. - Non-bleeding internal hemorrhoids. - The examination was otherwise normal.   Outpatient Encounter Medications as of 11/14/2022  Medication Sig   acyclovir (ZOVIRAX) 200 MG capsule TAKE 1 CAPSULE  BY MOUTH EVERY DAY   escitalopram (LEXAPRO) 20 MG tablet TAKE 1 TABLET BY MOUTH EVERY DAY IN THE MORNING   Multiple Vitamins-Minerals (MULTIVITAMIN GUMMIES ADULT PO) Take 1 tablet by mouth daily.   omeprazole (PRILOSEC) 20 MG capsule TAKE 1 CAPSULE BY MOUTH EVERY DAY   No facility-administered encounter medications on file as of 11/14/2022.    Allergies as of 11/14/2022 - Review Complete 11/14/2022  Allergen Reaction Noted   Sulfur Hives 01/02/2013   Sulfamethoxazole Hives 02/07/2007    Past Medical History:  Diagnosis Date   ABDOMINAL PAIN 05/01/2010   DEPRESSION 07/21/2007   DIVERTICULOSIS, COLON 07/26/2008   Headache(784.0) 07/26/2008   Pityriasis 09/13/2019    Past Surgical History:  Procedure Laterality Date   COLONOSCOPY     ERCP  august 2012   LAPAROSCOPIC CHOLECYSTECTOMY  08/14/2011   WISDOM TOOTH EXTRACTION      Family History  Problem Relation Age of Onset   Diabetes Mother    Heart disease Mother    Obesity Mother    Dementia Mother    Heart disease Father    Stroke Father    Colon cancer Paternal Aunt    Other Paternal Aunt        esophageal stricture   Esophageal cancer Neg Hx    Rectal cancer Neg Hx  Stomach cancer Neg Hx     Social History   Socioeconomic History   Marital status: Married    Spouse name: Not on file   Number of children: Not on file   Years of education: Not on file   Highest education level: Associate degree: occupational, Hotel manager, or vocational program  Occupational History   Not on file  Tobacco Use   Smoking status: Never   Smokeless tobacco: Never  Vaping Use   Vaping Use: Never used  Substance and Sexual Activity   Alcohol use: No   Drug use: No   Sexual activity: Not on file  Other Topics Concern   Not on file  Social History Narrative   Not on file   Social Determinants of Health   Financial Resource Strain: Low Risk  (07/18/2022)   Overall Financial Resource Strain (CARDIA)    Difficulty of Paying Living  Expenses: Not hard at all  Food Insecurity: No Food Insecurity (07/18/2022)   Hunger Vital Sign    Worried About Running Out of Food in the Last Year: Never true    [place] in the Last Year: Never true  Transportation Needs: No Transportation Needs (07/18/2022)   PRAPARE - Hydrologist (Medical): No    Lack of Transportation (Non-Medical): No  Physical Activity: Insufficiently Active (07/18/2022)   Exercise Vital Sign    Days of Exercise per Week: 2 days    Minutes of Exercise per Session: 20 min  Stress: No Stress Concern Present (07/18/2022)   [place]    Feeling of Stress : Not at all  Social Connections: [place] (07/18/2022)   Social Connection and Isolation Panel [NHANES]    Frequency of Communication with Friends and Family: More than three times a week    Frequency of Social Gatherings with Friends and Family: More than three times a week    Attends Religious Services: More than 4 times per year    Active Member of [employer] or Organizations: Yes    Attends Music therapist: More than 4 times per year    Marital Status: Married  Human resources officer Violence: Not At Risk (07/18/2022)   Humiliation, Afraid, Rape, and Kick questionnaire    Fear of Current or Ex-Partner: No    Emotionally Abused: No    Physically Abused: No    Sexually Abused: No      Review of systems: All other review of systems negative except as mentioned in the HPI.   Physical Exam: Vitals:   11/14/22 1107  BP: 122/70  Pulse: 76  SpO2: 98%   Body mass index is 24.06 kg/m. Gen:      No acute distress HEENT:  sclera anicteric Abd:      soft, non-tender; no palpable masses, no distension Ext:    No edema Neuro: alert and oriented x 3 Psych: normal mood and affect  Data Reviewed:  Reviewed labs, radiology imaging, old records and pertinent past GI work up   Assessment and  Plan/Recommendations:  72 year old very pleasant female with elevated bilirubin and most recent elevation in transaminases.  She is s/p cholecystectomy and sphincterotomy with pneumobilia Recheck CMP Check ANA, AMA, ceruloplasmin, chronic hepatitis B, hepatitis C, TTG IgA antibody, ceruloplasmin level, alpha-1 antitrypsin, ESR and CRP to exclude any other etiology for chronic liver disease Will obtain MRI abdomen with MRCP to exclude retained biliary sludge or stone causing the ball valving  effect and LFT abnormality  Return in 6 to 8 weeks   The patient was provided an opportunity to ask questions and all were answered. The patient agreed with the plan and demonstrated an understanding of the instructions.  Damaris Hippo , MD    CC: Billie Ruddy, MD

## 2022-11-14 NOTE — Patient Instructions (Signed)
Your provider has requested that you go to the basement level for lab work before leaving today. Press "B" on the elevator. The lab is located at the first door on the left as you exit the elevator.  You will be contacted by [facility] in the next 7 days to arrange a MRI/MRCP.  The number on your caller ID will be [phone], please answer when they call.  If you have not heard from them in 7 days please call [phone] to schedule.    The [place] [facility] providers would like to encourage you to use [facility] to communicate with providers for non-urgent requests or questions.  Due to long hold times on the telephone, sending your provider a message by [facility] may be a faster and more efficient way to get a response.  Please allow 48 business hours for a response.  Please remember that this is for non-urgent requests.   Due to recent changes in healthcare laws, you may see the results of your imaging and laboratory studies on MyChart before your provider has had a chance to review them.  We understand that in some cases there may be results that are confusing or concerning to you. Not all laboratory results come back in the same time frame and the provider may be waiting for multiple results in order to interpret others.  Please give [place] 48 hours in order for your provider to thoroughly review all the results before contacting the office for clarification of your results.

## 2022-11-15 ENCOUNTER — Encounter: Payer: Self-pay | Admitting: Gastroenterology

## 2022-11-17 LAB — HEPATITIS B SURFACE ANTIGEN: Hepatitis B Surface Ag: NONREACTIVE

## 2022-11-17 LAB — CERULOPLASMIN: Ceruloplasmin: 28 mg/dL (ref 18–53)

## 2022-11-17 LAB — ANA: Anti Nuclear Antibody (ANA): POSITIVE — AB

## 2022-11-17 LAB — HEPATITIS A ANTIBODY, TOTAL: Hepatitis A AB,Total: NONREACTIVE

## 2022-11-17 LAB — MITOCHONDRIAL ANTIBODIES: Mitochondrial M2 Ab, IgG: <=20 U (ref <=20.0)

## 2022-11-17 LAB — ALPHA-1-ANTITRYPSIN: A-1 Antitrypsin, Ser: 89 mg/dL (ref 83–199)

## 2022-11-17 LAB — IGA: Immunoglobulin A: 57 mg/dL — ABNORMAL LOW (ref 70–320)

## 2022-11-17 LAB — TISSUE TRANSGLUTAMINASE, IGA: (tTG) Ab, IgA: <1 U/mL

## 2022-11-17 LAB — ANTI-NUCLEAR AB-TITER (ANA TITER): ANA Titer 1: 1:40 {titer} — ABNORMAL HIGH

## 2022-11-17 LAB — HEPATITIS B SURFACE ANTIBODY,QUALITATIVE: Hep B S Ab: NONREACTIVE

## 2022-11-19 ENCOUNTER — Ambulatory Visit (HOSPITAL_COMMUNITY): Admission: RE | Admit: 2022-11-19 | Payer: Medicare Other | Source: Ambulatory Visit

## 2022-11-19 ENCOUNTER — Other Ambulatory Visit: Payer: Self-pay | Admitting: Gastroenterology

## 2022-11-19 ENCOUNTER — Encounter (HOSPITAL_COMMUNITY): Payer: Self-pay

## 2022-11-19 DIAGNOSIS — K7689 Other specified diseases of liver: Secondary | ICD-10-CM

## 2022-11-19 DIAGNOSIS — K838 Other specified diseases of biliary tract: Secondary | ICD-10-CM

## 2022-11-19 DIAGNOSIS — R7989 Other specified abnormal findings of blood chemistry: Secondary | ICD-10-CM

## 2022-11-24 ENCOUNTER — Ambulatory Visit (HOSPITAL_COMMUNITY)
Admission: RE | Admit: 2022-11-24 | Discharge: 2022-11-24 | Disposition: A | Discharge status: Home / Self Care | Payer: Medicare Other | Source: Ambulatory Visit | Attending: Gastroenterology | Admitting: Gastroenterology

## 2022-11-24 DIAGNOSIS — Z9889 Other specified postprocedural states: Secondary | ICD-10-CM | POA: Diagnosis not present

## 2022-11-24 DIAGNOSIS — K7689 Other specified diseases of liver: Secondary | ICD-10-CM | POA: Insufficient documentation

## 2022-11-24 DIAGNOSIS — R7989 Other specified abnormal findings of blood chemistry: Secondary | ICD-10-CM | POA: Diagnosis not present

## 2022-11-24 DIAGNOSIS — K838 Other specified diseases of biliary tract: Secondary | ICD-10-CM | POA: Insufficient documentation

## 2022-11-24 DIAGNOSIS — K449 Diaphragmatic hernia without obstruction or gangrene: Secondary | ICD-10-CM | POA: Diagnosis not present

## 2022-11-24 DIAGNOSIS — R935 Abnormal findings on diagnostic imaging of other abdominal regions, including retroperitoneum: Secondary | ICD-10-CM | POA: Diagnosis not present

## 2022-11-24 DIAGNOSIS — K760 Fatty (change of) liver, not elsewhere classified: Secondary | ICD-10-CM | POA: Diagnosis not present

## 2022-11-24 MED ORDER — GADOBUTROL 1 MMOL/ML IV SOLN
6.5000 mL | Freq: Once | INTRAVENOUS | Status: AC | PRN
  Administered 2022-11-24: 6.5 mL via INTRAVENOUS

## 2022-11-28 ENCOUNTER — Other Ambulatory Visit: Payer: Self-pay | Admitting: Family Medicine

## 2022-11-28 DIAGNOSIS — K219 Gastro-esophageal reflux disease without esophagitis: Secondary | ICD-10-CM

## 2022-12-01 ENCOUNTER — Other Ambulatory Visit: Payer: Self-pay | Admitting: Family Medicine

## 2022-12-05 ENCOUNTER — Encounter: Payer: Self-pay | Admitting: Gastroenterology

## 2022-12-05 ENCOUNTER — Telehealth: Payer: Self-pay

## 2022-12-05 NOTE — Telephone Encounter (Signed)
The patient has sent this message to [place]. I am forwarding it to you.  Alexis, [facility] Clinical Pool Phone Number: [phone]   This isnt a question.  Just FYI.  Late yesterday afternoon I had a repeat of abdominal pain that lasted only 30 minutes.  Same pain as started in November.  Nausea, no vomiting.  After 30 minutes I was fine and still fine today.  But was certainly that same feeling as when it all started.  No other symptoms.  Urine normal color.  Again, just in case it's beginning again.  I'll keep you posted.  Thank you.

## 2022-12-25 ENCOUNTER — Other Ambulatory Visit: Payer: Self-pay

## 2022-12-25 ENCOUNTER — Telehealth: Payer: Self-pay | Admitting: Gastroenterology

## 2022-12-25 DIAGNOSIS — R7989 Other specified abnormal findings of blood chemistry: Secondary | ICD-10-CM

## 2022-12-25 NOTE — Telephone Encounter (Signed)
PT was told in January to come back in 3 months to do labwork and she wants to know if she needs to have an appointment or can she just come and do them. Please advise.

## 2022-12-25 NOTE — Telephone Encounter (Signed)
Patient was seen 11/14/22. She was not given a follow up appointment. The labs done the same day indicate a recheck in 4 weeks.

## 2022-12-27 ENCOUNTER — Other Ambulatory Visit (INDEPENDENT_AMBULATORY_CARE_PROVIDER_SITE_OTHER): Payer: Medicare Other

## 2022-12-27 DIAGNOSIS — R7989 Other specified abnormal findings of blood chemistry: Secondary | ICD-10-CM | POA: Diagnosis not present

## 2022-12-27 LAB — COMPREHENSIVE METABOLIC PANEL
ALT: 9 U/L (ref 0–35)
AST: 17 U/L (ref 0–37)
Albumin: 4.2 g/dL (ref 3.5–5.2)
Alkaline Phosphatase: 100 U/L (ref 39–117)
BUN: 14 mg/dL (ref 6–23)
CO2: 29 mEq/L (ref 19–32)
Calcium: 9.3 mg/dL (ref 8.4–10.5)
Chloride: 105 mEq/L (ref 96–112)
Creatinine, Ser: 0.8 mg/dL (ref 0.40–1.20)
GFR: 74.59 mL/min (ref >60.00)
Glucose, Bld: 110 mg/dL — ABNORMAL HIGH (ref 70–99)
Potassium: 4 mEq/L (ref 3.5–5.1)
Sodium: 142 mEq/L (ref 135–145)
Total Bilirubin: 0.9 mg/dL (ref 0.2–1.2)
Total Protein: 6.4 g/dL (ref 6.0–8.3)

## 2022-12-27 NOTE — Addendum Note (Signed)
Addended by: Virgina Evener A on: 12/27/2022 02:16 PM   Modules accepted: Orders

## 2023-01-17 ENCOUNTER — Ambulatory Visit (INDEPENDENT_AMBULATORY_CARE_PROVIDER_SITE_OTHER): Payer: Medicare Other | Admitting: Family Medicine

## 2023-01-17 ENCOUNTER — Encounter: Payer: Self-pay | Admitting: Family Medicine

## 2023-01-17 VITALS — BP 118/72 | HR 90 | Temp 98.2°F | Ht 65.0 in | Wt 141.8 lb

## 2023-01-17 DIAGNOSIS — R0982 Postnasal drip: Secondary | ICD-10-CM | POA: Diagnosis not present

## 2023-01-17 DIAGNOSIS — J302 Other seasonal allergic rhinitis: Secondary | ICD-10-CM

## 2023-01-17 DIAGNOSIS — R052 Subacute cough: Secondary | ICD-10-CM

## 2023-01-17 NOTE — Progress Notes (Signed)
   Established Patient Office Visit   Subjective  Patient ID: [name], female    DOB: [birth year]  Age: 72 y.o. MRN: [id number]  Chief Complaint  Patient presents with   Cough    Patient complains of cough, x3 weeks, Patient reported she was diagnosed with Bronchitis last month, Productive cough    Pt is a 72 yo female with  has a past medical history of depression, diverticulosis, HSV, GERD who was seen for ongoing concern.  Patient endorses ongoing cough since the end of January when she had flu and bronchitis.  Patient pretty sure she had the flu as she had a temperature of 103.27F and her husband was diagnosed with flu.  Patient endorses productive cough with clear sputum.  Endorses occasional rhinorrhea.  Had sore throat with symptoms initially started but none recently.  Denies ear pain/pressure, wheezing, SOB.  Takes Tylenol PM nightly.  Patient also mentions "tiny headache"but attributes this to change in glasses prescription.  Plans to have eye exam in the next few weeks.  Typically has yearly eye exam in January.   Cough      Review of Systems  Respiratory:  Positive for cough.    Negative unless stated above    Objective:     BP 118/72 (BP Location: Left Arm, Patient Position: Sitting, Cuff Size: Normal)   Pulse 90   Temp 98.2 F (36.8 C) (Oral)   Ht '5\' 5"'$  (1.651 m)   Wt 141 lb 12.8 oz (64.3 kg)   SpO2 97%   BMI 23.60 kg/m    Physical Exam Constitutional:      General: She is not in acute distress.    Appearance: Normal appearance.  HENT:     Head: Normocephalic and atraumatic.     Nose: Nose normal.     Mouth/Throat:     Mouth: Mucous membranes are moist.  Cardiovascular:     Rate and Rhythm: Normal rate and regular rhythm.     Heart sounds: Normal heart sounds. No murmur heard.    No gallop.  Pulmonary:     Effort: Pulmonary effort is normal. No respiratory distress.     Breath sounds: Normal breath sounds. No wheezing, rhonchi or rales.   Skin:    General: Skin is warm and dry.  Neurological:     Mental Status: She is alert and oriented to person, place, and time.      No results found for any visits on 01/17/23.    Assessment & Plan:  Post-nasal drainage  Subacute cough  Seasonal allergies  Discussed cough likely 2/2 postnasal drainage from seasonal allergies.  Also consider postviral cough given history of influenza at the end of January.  Advised postviral cough may last for-6 weeks.  Will have patient start OTC antihistamine such as Allegra, Claritin, Zyrtec, or Xyzal.  Can also use saline nasal rinse or Flonase nasal spray.  Patient given precautions for continued or worsening symptoms.  Return if symptoms worsen or fail to improve.   Billie Ruddy, MD

## 2023-01-31 ENCOUNTER — Telehealth: Payer: Self-pay | Admitting: Gastroenterology

## 2023-01-31 DIAGNOSIS — R1013 Epigastric pain: Secondary | ICD-10-CM

## 2023-01-31 DIAGNOSIS — K7689 Other specified diseases of liver: Secondary | ICD-10-CM

## 2023-01-31 DIAGNOSIS — R7989 Other specified abnormal findings of blood chemistry: Secondary | ICD-10-CM

## 2023-01-31 DIAGNOSIS — K222 Esophageal obstruction: Secondary | ICD-10-CM

## 2023-01-31 DIAGNOSIS — R1011 Right upper quadrant pain: Secondary | ICD-10-CM

## 2023-01-31 NOTE — Telephone Encounter (Signed)
Patient called requesting to speak with you regarding labs she said she thinks her liver needs to be checked again.

## 2023-02-01 NOTE — Telephone Encounter (Signed)
Spoke with the patient. She had "another spell exactly like the one in the fall." She states it is difficult to describe. Location is under the breasts and across. It lasted about 30 minutes. There was a nauseous feeling. The last time this happened was in January. She will be due LFT recheck in [place]. Decline appointment 02/05/23 for evaluation. Agrees to contact me if this occurs again before 02/05/23 and she will consider the appointment.

## 2023-02-05 ENCOUNTER — Encounter: Payer: Self-pay | Admitting: Gastroenterology

## 2023-02-05 ENCOUNTER — Ambulatory Visit (INDEPENDENT_AMBULATORY_CARE_PROVIDER_SITE_OTHER): Payer: Medicare Other | Admitting: Gastroenterology

## 2023-02-05 ENCOUNTER — Other Ambulatory Visit (INDEPENDENT_AMBULATORY_CARE_PROVIDER_SITE_OTHER): Payer: Medicare Other

## 2023-02-05 VITALS — BP 118/78 | HR 80 | Ht 65.0 in | Wt 141.0 lb

## 2023-02-05 DIAGNOSIS — R9389 Abnormal findings on diagnostic imaging of other specified body structures: Secondary | ICD-10-CM

## 2023-02-05 DIAGNOSIS — R160 Hepatomegaly, not elsewhere classified: Secondary | ICD-10-CM

## 2023-02-05 DIAGNOSIS — R7989 Other specified abnormal findings of blood chemistry: Secondary | ICD-10-CM | POA: Diagnosis not present

## 2023-02-05 DIAGNOSIS — R978 Other abnormal tumor markers: Secondary | ICD-10-CM

## 2023-02-05 DIAGNOSIS — C249 Malignant neoplasm of biliary tract, unspecified: Secondary | ICD-10-CM

## 2023-02-05 DIAGNOSIS — R1011 Right upper quadrant pain: Secondary | ICD-10-CM

## 2023-02-05 DIAGNOSIS — R112 Nausea with vomiting, unspecified: Secondary | ICD-10-CM

## 2023-02-05 DIAGNOSIS — R1013 Epigastric pain: Secondary | ICD-10-CM | POA: Diagnosis not present

## 2023-02-05 LAB — CBC WITH DIFFERENTIAL/PLATELET
Basophils Absolute: 0.1 10*3/uL (ref 0.0–0.1)
Basophils Relative: 1.1 % (ref 0.0–3.0)
Eosinophils Absolute: 0.3 10*3/uL (ref 0.0–0.7)
Eosinophils Relative: 6.3 % — ABNORMAL HIGH (ref 0.0–5.0)
HCT: 41.4 % (ref 36.0–46.0)
Hemoglobin: 13.9 g/dL (ref 12.0–15.0)
Lymphocytes Relative: 36.9 % (ref 12.0–46.0)
Lymphs Abs: 1.7 10*3/uL (ref 0.7–4.0)
MCHC: 33.6 g/dL (ref 30.0–36.0)
MCV: 90.3 fl (ref 78.0–100.0)
Monocytes Absolute: 0.3 10*3/uL (ref 0.1–1.0)
Monocytes Relative: 7 % (ref 3.0–12.0)
Neutro Abs: 2.2 10*3/uL (ref 1.4–7.7)
Neutrophils Relative %: 48.7 % (ref 43.0–77.0)
Platelets: 260 10*3/uL (ref 150.0–400.0)
RBC: 4.58 Mil/uL (ref 3.87–5.11)
RDW: 13.6 % (ref 11.5–15.5)
WBC: 4.6 10*3/uL (ref 4.0–10.5)

## 2023-02-05 LAB — COMPREHENSIVE METABOLIC PANEL
ALT: 327 U/L — ABNORMAL HIGH (ref 0–35)
AST: 91 U/L — ABNORMAL HIGH (ref 0–37)
Albumin: 4.6 g/dL (ref 3.5–5.2)
Alkaline Phosphatase: 229 U/L — ABNORMAL HIGH (ref 39–117)
BUN: 17 mg/dL (ref 6–23)
CO2: 31 mEq/L (ref 19–32)
Calcium: 10 mg/dL (ref 8.4–10.5)
Chloride: 104 mEq/L (ref 96–112)
Creatinine, Ser: 0.69 mg/dL (ref 0.40–1.20)
GFR: 87.79 mL/min (ref >60.00)
Glucose, Bld: 98 mg/dL (ref 70–99)
Potassium: 4.7 mEq/L (ref 3.5–5.1)
Sodium: 140 mEq/L (ref 135–145)
Total Bilirubin: 1.1 mg/dL (ref 0.2–1.2)
Total Protein: 6.8 g/dL (ref 6.0–8.3)

## 2023-02-05 LAB — PROTIME-INR
INR: 0.9 ratio (ref 0.8–1.0)
Prothrombin Time: 10.3 s (ref 9.6–13.1)

## 2023-02-05 NOTE — Patient Instructions (Signed)
You have been scheduled for an abdominal ultrasound at [facility] Radiology (1st floor of hospital) on 02/08/2023 at 10:15am. Please arrive 30 minutes prior to your appointment for registration. Make certain not to have anything to eat or drink 6 hours prior to your appointment. Should you need to reschedule your appointment, please contact radiology at [phone]. This test typically takes about 30 minutes to perform.   LFT orders are in for as needed purposes   Your provider has requested that you go to the basement level for lab work before leaving today. Press "B" on the elevator. The lab is located at the first door on the left as you exit the elevator.   I appreciate the  opportunity to care for you  Thank You   Harl Bowie , MD

## 2023-02-05 NOTE — Progress Notes (Signed)
[name]    GF:[id number]    Sep 21, 1951  Primary Care Physician:Banks, Langley Adie, MD  Referring Physician: Billie Ruddy, [place],  [place] [id number]   Chief complaint:  Abnormal LFT, liver cysts Chief Complaint  Patient presents with   Abdominal Pain    Pt states has pain in the center of her stomach. Very nausea and vomited Saturday night. She states the pain comes and go's.   HPI: 72 year old very pleasant female here for follow-up visit for abnormal LFT.  She was experiencing upper abdominal discomfort, underwent CT abdomen pelvis which showed multiple hepatic status and pneumobilia.  She is s/p cholecystectomy with sphincterotomy     Latest Ref Rng & Units 12/27/2022    8:44 AM 11/14/2022   11:46 AM 10/01/2022    2:43 PM  Hepatic Function  Total Protein 6.0 - 8.3 g/dL 6.4  6.6  6.6   Albumin 3.5 - 5.2 g/dL 4.2  4.6  4.6   AST 0 - 37 U/L 17  23  345   ALT 0 - 35 U/L 9  19  307   Alk Phosphatase 39 - 117 U/L 100  127  150   Total Bilirubin 0.2 - 1.2 mg/dL 0.9  1.4  1.3     I last saw her on 11-14-22. At that time, her abdominal pain has resolved. She was taking Omeprazole and was no longer experiencing the urge to clear her throat.   Today, she complains of abdominal pain. She reports having 4 episodes in the past 3 weeks but did not have any symptoms in January or February. She states that she doesn't experience her pain daily but it tends to wax and weans. She reports that her urine was very dark in color during her episodes but reports that her urine color has since returned back to normal. She states her pain episodes are accompanied by  nausea and vomiting. She reports having normal bowel movements and denies experiencing vomiting and nausea any other time.    She denies any constipation, diarrhea, or blood in stool.    GI Hx: MR Abdomen MRCP W WO Contrast 11-24-22 1. Status post cholecystectomy. Unchanged postoperative  biliary ductal dilatation and pneumobilia, presumed secondary to ampullary instrumentation. The central common bile duct measures up to 1.1 cm in caliber. No obstructing calculus or other lesion identified to the ampulla. Consider further interrogation via ERCP if there is continued clinical concern for biliary obstruction. 2. 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 Although generally benign, this may be symptomatic due to large size and mass effect. Additional smaller, simple benign cyst of the left lobe of the liver. No associated solid component or contrast enhancement. 3. Hepatic steatosis. 4. Small hiatal hernia.  CT abd & pelvis 10/09/22 1. No acute abdominal findings, mass lesions or adenopathy. 2. Moderate sized hiatal hernia. 3. Status post cholecystectomy and sphincterotomy with pneumobilia. No biliary dilatation. 4. Simple appearing hepatic cysts  EGD 11/19/19 - Benign-appearing esophageal stenosis. Dilated. - 5 cm hiatal hernia. - Gastritis. Biopsied. - Normal examined duodenum.  Colonoscopy 09/02/19 - Diverticulosis in the sigmoid colon. - Non-bleeding internal hemorrhoids. - The examination was otherwise normal.   Current Outpatient Medications:    acyclovir (ZOVIRAX) 200 MG capsule, TAKE 1 CAPSULE BY MOUTH EVERY DAY, Disp: 90 capsule, Rfl: 0   escitalopram (LEXAPRO) 20 MG tablet, TAKE 1  TABLET BY MOUTH EVERY DAY IN THE MORNING, Disp: 90 tablet, Rfl: 3   Multiple Vitamins-Minerals (MULTIVITAMIN GUMMIES ADULT PO), Take 1 tablet by mouth daily., Disp: , Rfl:    omeprazole (PRILOSEC) 20 MG capsule, TAKE 1 CAPSULE BY MOUTH EVERY DAY, Disp: 30 capsule, Rfl: 3    Allergies as of 02/05/2023 - Review Complete 02/05/2023  Allergen Reaction Noted   Sulfur Hives 01/02/2013   Sulfamethoxazole Hives 02/07/2007    Past Medical History:  Diagnosis Date   ABDOMINAL PAIN 05/01/2010   DEPRESSION 07/21/2007   DIVERTICULOSIS,  COLON 07/26/2008   Headache(784.0) 07/26/2008   Pityriasis 09/13/2019    Past Surgical History:  Procedure Laterality Date   COLONOSCOPY     ERCP  august 2012   LAPAROSCOPIC CHOLECYSTECTOMY  08/14/2011   WISDOM TOOTH EXTRACTION      Family History  Problem Relation Age of Onset   Diabetes Mother    Heart disease Mother    Obesity Mother    Dementia Mother    Heart disease Father    Stroke Father    Colon cancer Paternal Aunt    Other Paternal Aunt        esophageal stricture   Esophageal cancer Neg Hx    Rectal cancer Neg Hx    Stomach cancer Neg Hx     Social History   Socioeconomic History   Marital status: Married    Spouse name: Not on file   Number of children: Not on file   Years of education: Not on file   Highest education level: Associate degree: occupational, Hotel manager, or vocational program  Occupational History   Not on file  Tobacco Use   Smoking status: Never   Smokeless tobacco: Never  Vaping Use   Vaping Use: Never used  Substance and Sexual Activity   Alcohol use: No   Drug use: No   Sexual activity: Not on file  Other Topics Concern   Not on file  Social History Narrative   Not on file   Social Determinants of Health   Financial Resource Strain: Low Risk  (07/18/2022)   Overall Financial Resource Strain (CARDIA)    Difficulty of Paying Living Expenses: Not hard at all  Food Insecurity: No Food Insecurity (07/18/2022)   Hunger Vital Sign    Worried About Running Out of Food in the Last Year: Never true    Ran Out of Food in the Last Year: Never true  Transportation Needs: No Transportation Needs (07/18/2022)   PRAPARE - Hydrologist (Medical): No    Lack of Transportation (Non-Medical): No  Physical Activity: Insufficiently Active (07/18/2022)   Exercise Vital Sign    Days of Exercise per Week: 2 days    Minutes of Exercise per Session: 20 min  Stress: No Stress Concern Present (07/18/2022)   [place]    Feeling of Stress : Not at all  Social Connections: [place] (07/18/2022)   Social Connection and Isolation Panel [NHANES]    Frequency of Communication with Friends and Family: More than three times a week    Frequency of Social Gatherings with Friends and Family: More than three times a week    Attends Religious Services: More than 4 times per year    Active Member of [employer] or Organizations: Yes    Attends Music therapist: More than 4 times per year    Marital Status: Married  Human resources officer  Violence: Not At Risk (07/18/2022)   Humiliation, Afraid, Rape, and Kick questionnaire    Fear of Current or Ex-Partner: No    Emotionally Abused: No    Physically Abused: No    Sexually Abused: No      Review of systems: Review of Systems  Gastrointestinal:  Positive for abdominal pain, nausea and vomiting. Negative for blood in stool, constipation and diarrhea.      Physical Exam: General: well-appearing   Eyes: sclera anicteric, no redness ENT: oral mucosa moist without lesions, no cervical or supraclavicular lymphadenopathy CV: RRR, no JVD, no peripheral edema Resp: clear to auscultation bilaterally, normal RR and effort noted GI: soft, epigastric tenderness, RUQ tenderness, active bowel sounds. No guarding or palpable organomegaly noted. Skin; warm and dry, no rash or jaundice noted Neuro: awake, alert and oriented x 3. Normal gross motor function and fluent speech    Data Reviewed:  Reviewed labs, radiology imaging, old records and pertinent past GI work up   Assessment and Plan/Recommendations:  72 year old very pleasant female with elevated bilirubin and  transaminases.  She is s/p cholecystectomy and sphincterotomy with pneumobilia. Large subcapsular cyst of the liver dome 12.3 into 9.9 cm, no solid component likely benign cystadenoma  Workup negative for any other underlying chronic liver  disease Her symptoms are likely secondary to cystoadenoma, unclear if this is causing biliary compression, will recheck LFT and also obtain right upper quadrant ultrasound  I will request Dr. Zenia Resides to review the recent MRI and see if she will benefit from resection of the large subcapsular cyst as patient is having more symptoms  Return in 2 to 3 months  The patient was provided an opportunity to ask questions and all were answered. The patient agreed with the plan and demonstrated an understanding of the instructions.   I,Safa M Kadhim,acting as a scribe for Harl Bowie, MD.,have documented all relevant documentation on the behalf of Harl Bowie, MD,as directed by  Harl Bowie, MD while in the presence of Harl Bowie, MD.   I, Harl Bowie, MD, have reviewed all documentation for this visit. The documentation on 02/05/23 for the exam, diagnosis, procedures, and orders are all accurate and complete.   Damaris Hippo , MD    CC: Billie Ruddy, MD

## 2023-02-05 NOTE — Telephone Encounter (Signed)
I will discuss with patient when she comes in for a visit this afternoon.  She will need follow-up labs and imaging.

## 2023-02-06 ENCOUNTER — Other Ambulatory Visit: Payer: Self-pay | Admitting: *Deleted

## 2023-02-06 DIAGNOSIS — R7989 Other specified abnormal findings of blood chemistry: Secondary | ICD-10-CM

## 2023-02-06 DIAGNOSIS — R112 Nausea with vomiting, unspecified: Secondary | ICD-10-CM

## 2023-02-06 DIAGNOSIS — R1011 Right upper quadrant pain: Secondary | ICD-10-CM

## 2023-02-06 DIAGNOSIS — R1013 Epigastric pain: Secondary | ICD-10-CM

## 2023-02-06 MED ORDER — TRAMADOL HCL 50 MG PO TABS: 50.0000 mg | ORAL_TABLET | Freq: Two times a day (BID) | ORAL | 1 refills | Status: DC

## 2023-02-06 NOTE — Telephone Encounter (Signed)
Patient decided against medication but given she is requesting it , please send Rx for Tramadol 50 mg BID X 30 days with 1 refill. Please schedule patient for EGD next available appointment at [facility]. Thanks

## 2023-02-06 NOTE — Telephone Encounter (Signed)
Called patient back to schedule the endoscopy, she was at the pharmacy and wanted me to call her back in the morning to schedule.

## 2023-02-06 NOTE — Telephone Encounter (Signed)
I don't think she can tolerate bowel prep now, will plan to do only EGD and schedule colonoscopy later. I discussed with Dr Zenia Resides and she wants patient to undergo EGD first and will be seeing her soon. Thanks

## 2023-02-06 NOTE — Telephone Encounter (Signed)
She did say she declined the pain medication while in the office ..... I personally called in Tramadol rx to [facility] for patient  Dr Silverio Decamp, Patient is wondering if she needs the EGD before seeing [facility]. Which has not been scheduled but they have contacted her and are reviewing her notes.   Also she said she is due for her colonoscopy

## 2023-02-06 NOTE — Telephone Encounter (Signed)
Patient calling to say she said Dr Silverio Decamp said she would give her something for pain, But she didn't  Abdominal Pain is a 10... Nausea but no vomiting    No fever But pain is unbearable she wants something for pain  Ultrasound is scheduled for this Friday 29 th. Dr Silverio Decamp please advise

## 2023-02-07 ENCOUNTER — Encounter: Payer: Self-pay | Admitting: *Deleted

## 2023-02-07 LAB — CANCER ANTIGEN 19-9: CA 19-9: 30 U/mL (ref <34)

## 2023-02-07 LAB — AFP TUMOR MARKER: AFP-Tumor Marker: 3.5 ng/mL

## 2023-02-07 LAB — CEA: CEA: <2 ng/mL

## 2023-02-07 NOTE — Telephone Encounter (Addendum)
Patient picked up and took tramadol, she feels better but did have trouble sleeping   Endoscopy with possible dilation , hx of stricture, scheduled for 02/27/2023 at 4  Explained to patient she will receive instructions in My Chart she will sign consent in admitting the day of procedure   Went over instructions over the phone with patient

## 2023-02-07 NOTE — Addendum Note (Signed)
Addended by: Oda Kilts on: 02/07/2023 09:31 AM   Modules accepted: Orders

## 2023-02-08 ENCOUNTER — Ambulatory Visit (HOSPITAL_COMMUNITY)
Admission: RE | Admit: 2023-02-08 | Discharge: 2023-02-08 | Disposition: A | Discharge status: Home / Self Care | Payer: Medicare Other | Source: Ambulatory Visit | Attending: Gastroenterology | Admitting: Gastroenterology

## 2023-02-08 DIAGNOSIS — R1011 Right upper quadrant pain: Secondary | ICD-10-CM | POA: Insufficient documentation

## 2023-02-08 DIAGNOSIS — K760 Fatty (change of) liver, not elsewhere classified: Secondary | ICD-10-CM | POA: Diagnosis not present

## 2023-02-08 DIAGNOSIS — R112 Nausea with vomiting, unspecified: Secondary | ICD-10-CM | POA: Insufficient documentation

## 2023-02-08 DIAGNOSIS — R160 Hepatomegaly, not elsewhere classified: Secondary | ICD-10-CM | POA: Insufficient documentation

## 2023-02-08 DIAGNOSIS — R1013 Epigastric pain: Secondary | ICD-10-CM | POA: Diagnosis not present

## 2023-02-08 DIAGNOSIS — K7689 Other specified diseases of liver: Secondary | ICD-10-CM | POA: Diagnosis not present

## 2023-02-15 ENCOUNTER — Encounter (HOSPITAL_COMMUNITY): Payer: Self-pay

## 2023-02-15 ENCOUNTER — Telehealth: Payer: Self-pay | Admitting: Gastroenterology

## 2023-02-15 ENCOUNTER — Other Ambulatory Visit: Payer: Self-pay

## 2023-02-15 ENCOUNTER — Inpatient Hospital Stay (HOSPITAL_COMMUNITY)
Admission: EM | Admit: 2023-02-15 | Discharge: 2023-02-17 | DRG: 446 | Disposition: A | Discharge status: Home / Self Care | Payer: Medicare Other | Attending: Family Medicine | Admitting: Family Medicine

## 2023-02-15 ENCOUNTER — Emergency Department (HOSPITAL_COMMUNITY): Payer: Medicare Other

## 2023-02-15 DIAGNOSIS — K8051 Calculus of bile duct without cholangitis or cholecystitis with obstruction: Secondary | ICD-10-CM | POA: Diagnosis not present

## 2023-02-15 DIAGNOSIS — K838 Other specified diseases of biliary tract: Principal | ICD-10-CM

## 2023-02-15 DIAGNOSIS — K449 Diaphragmatic hernia without obstruction or gangrene: Secondary | ICD-10-CM | POA: Diagnosis present

## 2023-02-15 DIAGNOSIS — Z79899 Other long term (current) drug therapy: Secondary | ICD-10-CM

## 2023-02-15 DIAGNOSIS — Z8249 Family history of ischemic heart disease and other diseases of the circulatory system: Secondary | ICD-10-CM

## 2023-02-15 DIAGNOSIS — Z9049 Acquired absence of other specified parts of digestive tract: Secondary | ICD-10-CM | POA: Diagnosis not present

## 2023-02-15 DIAGNOSIS — R7401 Elevation of levels of liver transaminase levels: Secondary | ICD-10-CM

## 2023-02-15 DIAGNOSIS — Z800 Family history of malignant neoplasm of digestive organs: Secondary | ICD-10-CM | POA: Diagnosis not present

## 2023-02-15 DIAGNOSIS — Z886 Allergy status to analgesic agent status: Secondary | ICD-10-CM

## 2023-02-15 DIAGNOSIS — R1013 Epigastric pain: Secondary | ICD-10-CM

## 2023-02-15 DIAGNOSIS — K529 Noninfective gastroenteritis and colitis, unspecified: Secondary | ICD-10-CM | POA: Diagnosis present

## 2023-02-15 DIAGNOSIS — K219 Gastro-esophageal reflux disease without esophagitis: Secondary | ICD-10-CM | POA: Diagnosis present

## 2023-02-15 DIAGNOSIS — R1011 Right upper quadrant pain: Secondary | ICD-10-CM | POA: Diagnosis present

## 2023-02-15 DIAGNOSIS — K7689 Other specified diseases of liver: Secondary | ICD-10-CM | POA: Diagnosis present

## 2023-02-15 DIAGNOSIS — Z833 Family history of diabetes mellitus: Secondary | ICD-10-CM | POA: Diagnosis not present

## 2023-02-15 DIAGNOSIS — R7989 Other specified abnormal findings of blood chemistry: Secondary | ICD-10-CM | POA: Diagnosis not present

## 2023-02-15 DIAGNOSIS — Z882 Allergy status to sulfonamides status: Secondary | ICD-10-CM

## 2023-02-15 DIAGNOSIS — K6389 Other specified diseases of intestine: Secondary | ICD-10-CM | POA: Diagnosis not present

## 2023-02-15 DIAGNOSIS — Z91018 Allergy to other foods: Secondary | ICD-10-CM | POA: Diagnosis not present

## 2023-02-15 DIAGNOSIS — K8050 Calculus of bile duct without cholangitis or cholecystitis without obstruction: Secondary | ICD-10-CM | POA: Diagnosis not present

## 2023-02-15 DIAGNOSIS — R112 Nausea with vomiting, unspecified: Secondary | ICD-10-CM | POA: Diagnosis not present

## 2023-02-15 DIAGNOSIS — R748 Abnormal levels of other serum enzymes: Secondary | ICD-10-CM | POA: Diagnosis not present

## 2023-02-15 DIAGNOSIS — F32A Depression, unspecified: Secondary | ICD-10-CM | POA: Diagnosis present

## 2023-02-15 DIAGNOSIS — Z823 Family history of stroke: Secondary | ICD-10-CM

## 2023-02-15 DIAGNOSIS — E876 Hypokalemia: Secondary | ICD-10-CM | POA: Diagnosis present

## 2023-02-15 LAB — CBC
HCT: 41.6 % (ref 36.0–46.0)
Hemoglobin: 13.8 g/dL (ref 12.0–15.0)
MCH: 30.6 pg (ref 26.0–34.0)
MCHC: 33.2 g/dL (ref 30.0–36.0)
MCV: 92.2 fL (ref 80.0–100.0)
Platelets: 336 10*3/uL (ref 150–400)
RBC: 4.51 MIL/uL (ref 3.87–5.11)
RDW: 12.7 % (ref 11.5–15.5)
WBC: 13.1 10*3/uL — ABNORMAL HIGH (ref 4.0–10.5)
nRBC: 0 % (ref 0.0–0.2)

## 2023-02-15 LAB — CBC WITH DIFFERENTIAL/PLATELET
Abs Immature Granulocytes: 0.04 10*3/uL (ref 0.00–0.07)
Basophils Absolute: 0 10*3/uL (ref 0.0–0.1)
Basophils Relative: 0 %
Eosinophils Absolute: 0 10*3/uL (ref 0.0–0.5)
Eosinophils Relative: 0 %
HCT: 44.7 % (ref 36.0–46.0)
Hemoglobin: 14.9 g/dL (ref 12.0–15.0)
Immature Granulocytes: 0 %
Lymphocytes Relative: 4 %
Lymphs Abs: 0.5 10*3/uL — ABNORMAL LOW (ref 0.7–4.0)
MCH: 30.3 pg (ref 26.0–34.0)
MCHC: 33.3 g/dL (ref 30.0–36.0)
MCV: 90.9 fL (ref 80.0–100.0)
Monocytes Absolute: 0.6 10*3/uL (ref 0.1–1.0)
Monocytes Relative: 4 %
Neutro Abs: 11.5 10*3/uL — ABNORMAL HIGH (ref 1.7–7.7)
Neutrophils Relative %: 92 %
Platelets: 310 10*3/uL (ref 150–400)
RBC: 4.92 MIL/uL (ref 3.87–5.11)
RDW: 12.7 % (ref 11.5–15.5)
WBC: 12.6 10*3/uL — ABNORMAL HIGH (ref 4.0–10.5)
nRBC: 0 % (ref 0.0–0.2)

## 2023-02-15 LAB — LIPASE, BLOOD: Lipase: 30 U/L (ref 11–51)

## 2023-02-15 LAB — COMPREHENSIVE METABOLIC PANEL
ALT: 353 U/L — ABNORMAL HIGH (ref 0–44)
AST: 258 U/L — ABNORMAL HIGH (ref 15–41)
Albumin: 4.4 g/dL (ref 3.5–5.0)
Alkaline Phosphatase: 420 U/L — ABNORMAL HIGH (ref 38–126)
Anion gap: 11 (ref 5–15)
BUN: 12 mg/dL (ref 8–23)
CO2: 25 mmol/L (ref 22–32)
Calcium: 9.6 mg/dL (ref 8.9–10.3)
Chloride: 102 mmol/L (ref 98–111)
Creatinine, Ser: 0.59 mg/dL (ref 0.44–1.00)
GFR, Estimated: >60 mL/min (ref >60)
Glucose, Bld: 150 mg/dL — ABNORMAL HIGH (ref 70–99)
Potassium: 3.5 mmol/L (ref 3.5–5.1)
Sodium: 138 mmol/L (ref 135–145)
Total Bilirubin: 4.1 mg/dL — ABNORMAL HIGH (ref 0.3–1.2)
Total Protein: 7.4 g/dL (ref 6.5–8.1)

## 2023-02-15 LAB — CREATININE, SERUM
Creatinine, Ser: 0.59 mg/dL (ref 0.44–1.00)
GFR, Estimated: >60 mL/min (ref >60)

## 2023-02-15 MED ORDER — SODIUM CHLORIDE 0.9 % IV BOLUS
1000.0000 mL | Freq: Once | INTRAVENOUS | Status: AC
  Administered 2023-02-15: 1000 mL via INTRAVENOUS

## 2023-02-15 MED ORDER — ENOXAPARIN SODIUM 40 MG/0.4ML IJ SOSY
40.0000 mg | PREFILLED_SYRINGE | INTRAMUSCULAR | Status: DC
  Administered 2023-02-15 – 2023-02-16 (×2): 40 mg via SUBCUTANEOUS
  Filled 2023-02-15 (×2): qty 0.4

## 2023-02-15 MED ORDER — HYDROMORPHONE HCL 1 MG/ML IJ SOLN
1.0000 mg | INTRAMUSCULAR | Status: DC | PRN
  Administered 2023-02-15 – 2023-02-16 (×3): 1 mg via INTRAVENOUS
  Filled 2023-02-15 (×3): qty 1

## 2023-02-15 MED ORDER — PANTOPRAZOLE SODIUM 40 MG IV SOLR
40.0000 mg | Freq: Once | INTRAVENOUS | Status: AC
  Administered 2023-02-15: 40 mg via INTRAVENOUS
  Filled 2023-02-15: qty 10

## 2023-02-15 MED ORDER — ONDANSETRON HCL 4 MG/2ML IJ SOLN
4.0000 mg | Freq: Once | INTRAMUSCULAR | Status: AC
  Administered 2023-02-15: 4 mg via INTRAVENOUS
  Filled 2023-02-15: qty 2

## 2023-02-15 MED ORDER — ONDANSETRON HCL 4 MG/2ML IJ SOLN
4.0000 mg | Freq: Four times a day (QID) | INTRAMUSCULAR | Status: DC | PRN
  Filled 2023-02-15: qty 2

## 2023-02-15 MED ORDER — HYDROMORPHONE HCL 1 MG/ML IJ SOLN
0.5000 mg | Freq: Once | INTRAMUSCULAR | Status: DC
  Filled 2023-02-15: qty 1
  Filled 2023-02-15: qty 0.5

## 2023-02-15 MED ORDER — DEXTROSE IN LACTATED RINGERS 5 % IV SOLN: INTRAVENOUS | Status: DC

## 2023-02-15 MED ORDER — ONDANSETRON HCL 4 MG PO TABS: 4.0000 mg | ORAL_TABLET | Freq: Four times a day (QID) | ORAL | Status: DC | PRN

## 2023-02-15 MED ORDER — IOHEXOL 300 MG/ML  SOLN
100.0000 mL | Freq: Once | INTRAMUSCULAR | Status: AC | PRN
  Administered 2023-02-15: 100 mL via INTRAVENOUS

## 2023-02-15 MED ORDER — SODIUM CHLORIDE 0.9 % IV BOLUS
500.0000 mL | Freq: Once | INTRAVENOUS | Status: AC
  Administered 2023-02-15: 500 mL via INTRAVENOUS

## 2023-02-15 NOTE — ED Triage Notes (Signed)
Patient is here for evaluation of upper abdominal pain. Reports this has been going on since November, but got worse today. Reports one episode of vomiting, denies diarrhea or urinary issues.

## 2023-02-15 NOTE — Telephone Encounter (Signed)
PT is at [facility] and having severe upper abdominal pain and wanted to make [place] aware.. Please advise.

## 2023-02-15 NOTE — ED Provider Notes (Signed)
Assumed care of patient from off-going team. For more details, please see note from same day.  In brief, this is a 72 y.o. female who follow w/ GI for elevated LFTs and RUQ pain thought to be due to subcapsular liver cyst. Presented here today with epigastric/RUQ abd pain.  Plan/Dispo at time of sign-out & ED Course since sign-out: [ ]  CT abd pelvis, likely engage w/ [facility]  BP 132/83   Pulse 72   Temp 97.9 F (36.6 C) (Oral)   Resp 17   Ht 5\' 5"  (1.651 m)   Wt 64 kg   SpO2 99%   BMI 23.46 kg/m    ED Course:   Clinical Course as of 02/15/23 1843  Fri Feb 15, 2023  1732 WBC(!): 12.6 +leukocytosis [HN]  1732 AST(!): 258 [HN]  1732 ALT(!): 353 [HN]  1732 Alkaline Phosphatase(!): 420 [HN]  1732 Total Bilirubin(!): 4.1 Increasing LFTs [HN]  1733 Lipase: 30 [HN]  1815 CT ABDOMEN PELVIS W CONTRAST 1. Long segment of mild wall thickening of the left colon involving the distal transverse colon, descending, and sigmoid colon, suggestive of a nonspecific colitis. 2. Intra- and extra-hepatic biliary dilatation with pneumobilia. Common bile duct is dilated up to 1.4 cm, previously 1.1 cm. No obstructive etiology is identified. Correlate with LFTs and consider follow-up nonemergent ERCP as clinically indicated.  3. Stable large subcapsular cyst at the right hepatic dome measuring approximately 12 cm. This lesion may be symptomatic due to local mass effect. 4. Small hiatal hernia.      [HN]  1833 D/w Dr. Russella Dar w/ [facility] who states that with increasing LFTs, no clear obstructive cause of pneumobilia, patient would benefit from admission for MRCP and MRI. Consulted to hospitalist. [HN]  1843 Admitted to hospitalist [HN]    Clinical Course User Index [HN] Loetta Rough, MD    Dispo: Admit ------------------------------- Vivi Barrack, MD Emergency Medicine  This note was created using dictation software, which may contain spelling or grammatical errors.   Loetta Rough, MD 02/15/23 [phone]

## 2023-02-15 NOTE — Telephone Encounter (Signed)
Inbound call from patient ,she is having a severe stomach pain and can't take it any more medication is not helping her she said .Marland KitchenPlease advise

## 2023-02-15 NOTE — H&P (Signed)
  History and Physical    Patient: [name]:[id number] DOB: 1951-04-11 DOA: 02/15/2023 DOS: the patient was seen and examined on 02/15/2023 PCP: Deeann Saint, MD  Patient coming from: {Point_of_Origin:[address]}  Chief Complaint:  Chief Complaint  Patient presents with   Abdominal Pain   HPI: [name] is a 72 y.o. female with medical history significant of ***  Review of Systems: {ROS_Text:26778} Past Medical History:  Diagnosis Date   ABDOMINAL PAIN 05/01/2010   DEPRESSION 07/21/2007   DIVERTICULOSIS, COLON 07/26/2008   Headache(784.0) 07/26/2008   Pityriasis 09/13/2019   Past Surgical History:  Procedure Laterality Date   COLONOSCOPY     ERCP  august 2012   LAPAROSCOPIC CHOLECYSTECTOMY  08/14/2011   WISDOM TOOTH EXTRACTION     Social History:  reports that she has never smoked. She has never used smokeless tobacco. She reports that she does not drink alcohol and does not use drugs.  Allergies  Allergen Reactions   Sulfur Hives   Sulfamethoxazole Hives    Happened in childhood, does not remember reaction.    Family History  Problem Relation Age of Onset   Diabetes Mother    Heart disease Mother    Obesity Mother    Dementia Mother    Heart disease Father    Stroke Father    Colon cancer Paternal Aunt    Other Paternal Aunt        esophageal stricture   Esophageal cancer Neg Hx    Rectal cancer Neg Hx    Stomach cancer Neg Hx     Prior to Admission medications   Medication Sig Start Date End Date Taking? Authorizing Provider  acyclovir (ZOVIRAX) 200 MG capsule TAKE 1 CAPSULE BY MOUTH EVERY DAY 12/03/22   Deeann Saint, MD  escitalopram (LEXAPRO) 20 MG tablet TAKE 1 TABLET BY MOUTH EVERY DAY IN THE MORNING 04/04/22   Deeann Saint, MD  Multiple Vitamins-Minerals (MULTIVITAMIN GUMMIES ADULT PO) Take 1 tablet by mouth daily.    [provider]  omeprazole (PRILOSEC) 20 MG capsule TAKE 1 CAPSULE BY MOUTH EVERY DAY 11/28/22   Deeann Saint, MD  traMADol (ULTRAM) 50 MG tablet Take 1 tablet (50 mg total) by mouth 2 (two) times daily. 02/06/23   Napoleon Form, MD    Physical Exam: Vitals:   02/15/23 1423 02/15/23 1730  BP: (!) 141/78 132/83  Pulse: 75 72  Resp: 16 17  Temp: 97.9 F (36.6 C)   TempSrc: Oral   SpO2: 100% 99%  Weight: 64 kg   Height: 5\' 5"  (1.651 m)    *** Data Reviewed: {Tip this will not be part of the note when signed- Document your independent interpretation of telemetry tracing, EKG, lab, Radiology test or any other diagnostic tests. Add any new diagnostic test ordered today. (Optional):26781} {Results:26384}  Assessment and Plan: No notes have been filed under this hospital service. Service: Hospitalist     Advance Care Planning:   Code Status: Full Code ***  Consults: ***  Family Communication: ***  Severity of Illness: {Observation/Inpatient:21159}  AuthorLonia Blood, MD 02/15/2023 6:47 PM  For on call review [email or web address].

## 2023-02-15 NOTE — ED Notes (Signed)
Pt states she does not have pain at this time, and does not believe dilaudid is necessary at the moment. Pt informed to call for pain medication if status changes.

## 2023-02-15 NOTE — Telephone Encounter (Signed)
FYI

## 2023-02-15 NOTE — ED Provider Notes (Signed)
[place] EMERGENCY DEPARTMENT AT [facility] Provider Note   CSN: [id number] Arrival date & time: 02/15/23  1414     History {Add pertinent medical, surgical, social history, OB history to HPI:1} Chief Complaint  Patient presents with   Abdominal Pain    [name] is a 72 y.o. female.  Patient complains of abdominal pain.  Patient has been having episodes of abdominal pain since November and is followed at [facility].  She has had cysts on her liver and elevated liver enzymes.  Patient also has a history of cholecystectomy  The history is provided by the patient and medical records. No language interpreter was used.  Abdominal Pain Pain location:  Epigastric Pain quality: aching   Pain radiates to:  Does not radiate Pain severity:  Moderate Onset quality:  Sudden Timing:  Constant Progression:  Worsening Chronicity:  New Associated symptoms: no chest pain, no cough, no diarrhea, no fatigue and no hematuria        Home Medications Prior to Admission medications   Medication Sig Start Date End Date Taking? Authorizing Provider  acyclovir (ZOVIRAX) 200 MG capsule TAKE 1 CAPSULE BY MOUTH EVERY DAY 12/03/22   Deeann Saint, MD  escitalopram (LEXAPRO) 20 MG tablet TAKE 1 TABLET BY MOUTH EVERY DAY IN THE MORNING 04/04/22   Deeann Saint, MD  Multiple Vitamins-Minerals (MULTIVITAMIN GUMMIES ADULT PO) Take 1 tablet by mouth daily.    [provider]  omeprazole (PRILOSEC) 20 MG capsule TAKE 1 CAPSULE BY MOUTH EVERY DAY 11/28/22   Deeann Saint, MD  traMADol (ULTRAM) 50 MG tablet Take 1 tablet (50 mg total) by mouth 2 (two) times daily. 02/06/23   Napoleon Form, MD      Allergies    Sulfur and Sulfamethoxazole    Review of Systems   Review of Systems  Constitutional:  Negative for appetite change and fatigue.  HENT:  Negative for congestion, ear discharge and sinus pressure.   Eyes:  Negative for discharge.  Respiratory:  Negative for  cough.   Cardiovascular:  Negative for chest pain.  Gastrointestinal:  Positive for abdominal pain. Negative for diarrhea.  Genitourinary:  Negative for frequency and hematuria.  Musculoskeletal:  Negative for back pain.  Skin:  Negative for rash.  Neurological:  Negative for seizures and headaches.  Psychiatric/Behavioral:  Negative for hallucinations.     Physical Exam Updated Vital Signs BP (!) 141/78 (BP Location: Left Arm)   Pulse 75   Temp 97.9 F (36.6 C) (Oral)   Resp 16   Ht 5\' 5"  (1.651 m)   Wt 64 kg   SpO2 100%   BMI 23.46 kg/m  Physical Exam Vitals and nursing note reviewed.  Constitutional:      Appearance: She is well-developed.  HENT:     Head: Normocephalic.     Nose: Nose normal.  Eyes:     General: No scleral icterus.    Conjunctiva/sclera: Conjunctivae normal.  Neck:     Thyroid: No thyromegaly.  Cardiovascular:     Rate and Rhythm: Normal rate and regular rhythm.     Heart sounds: No murmur heard.    No friction rub. No gallop.  Pulmonary:     Breath sounds: No stridor. No wheezing or rales.  Chest:     Chest wall: No tenderness.  Abdominal:     General: There is no distension.     Tenderness: There is no abdominal tenderness. There is no rebound.  Musculoskeletal:        General: Normal range of motion.     Cervical back: Neck supple.  Lymphadenopathy:     Cervical: No cervical adenopathy.  Skin:    Findings: No erythema or rash.  Neurological:     Mental Status: She is alert and oriented to person, place, and time.     Motor: No abnormal muscle tone.     Coordination: Coordination normal.  Psychiatric:        Behavior: Behavior normal.     ED Results / Procedures / Treatments   Labs (all labs ordered are listed, but only abnormal results are displayed) Labs Reviewed  CBC WITH DIFFERENTIAL/PLATELET - Abnormal; Notable for the following components:      Result Value   WBC 12.6 (*)    Neutro Abs 11.5 (*)    Lymphs Abs 0.5 (*)     All other components within normal limits  COMPREHENSIVE METABOLIC PANEL - Abnormal; Notable for the following components:   Glucose, Bld 150 (*)    AST 258 (*)    ALT 353 (*)    Alkaline Phosphatase 420 (*)    Total Bilirubin 4.1 (*)    All other components within normal limits  LIPASE, BLOOD    EKG None  Radiology No results found.  Procedures Procedures  {Document cardiac monitor, telemetry assessment procedure when appropriate:1}  Medications Ordered in ED Medications  HYDROmorphone (DILAUDID) injection 0.5 mg (has no administration in time range)  sodium chloride 0.9 % bolus 500 mL (0 mLs Intravenous Stopped 02/15/23 1611)  ondansetron (ZOFRAN) injection 4 mg (4 mg Intravenous Given 02/15/23 1523)  pantoprazole (PROTONIX) injection 40 mg (40 mg Intravenous Given 02/15/23 1606)  sodium chloride 0.9 % bolus 1,000 mL (1,000 mLs Intravenous New Bag/Given 02/15/23 1606)    ED Course/ Medical Decision Making/ A&P   {   Click here for ABCD2, HEART and other calculatorsREFRESH Note before signing :1}                          Medical Decision Making Amount and/or Complexity of Data Reviewed Labs: ordered. Radiology: ordered.  Risk Prescription drug management.   Patient with abdominal pain and elevated liver studies along with T. bili 4.1.  CT scan abdomen pending.  Disposition will be determined by my colleague Dr. Jearld FentonNaasz  {Document critical care time when appropriate:1} {Document review of labs and clinical decision tools ie heart score, Chads2Vasc2 etc:1}  {Document your independent review of radiology images, and any outside records:1} {Document your discussion with family members, caretakers, and with consultants:1} {Document social determinants of health affecting pt's care:1} {Document your decision making why or why not admission, treatments were needed:1} Final Clinical Impression(s) / ED Diagnoses Final diagnoses:  None    Rx / DC Orders ED Discharge Orders      None

## 2023-02-16 ENCOUNTER — Inpatient Hospital Stay (HOSPITAL_COMMUNITY): Payer: Medicare Other | Admitting: Anesthesiology

## 2023-02-16 ENCOUNTER — Encounter (HOSPITAL_COMMUNITY): Payer: Self-pay | Admitting: Internal Medicine

## 2023-02-16 ENCOUNTER — Encounter (HOSPITAL_COMMUNITY)
Admission: EM | Disposition: A | Discharge status: Home / Self Care | Payer: Self-pay | Source: Home / Self Care | Attending: Family Medicine

## 2023-02-16 DIAGNOSIS — R748 Abnormal levels of other serum enzymes: Secondary | ICD-10-CM | POA: Diagnosis not present

## 2023-02-16 DIAGNOSIS — R1013 Epigastric pain: Secondary | ICD-10-CM | POA: Diagnosis not present

## 2023-02-16 DIAGNOSIS — K8051 Calculus of bile duct without cholangitis or cholecystitis with obstruction: Secondary | ICD-10-CM

## 2023-02-16 DIAGNOSIS — R7989 Other specified abnormal findings of blood chemistry: Secondary | ICD-10-CM

## 2023-02-16 DIAGNOSIS — R112 Nausea with vomiting, unspecified: Secondary | ICD-10-CM

## 2023-02-16 DIAGNOSIS — K7689 Other specified diseases of liver: Secondary | ICD-10-CM | POA: Diagnosis not present

## 2023-02-16 DIAGNOSIS — K838 Other specified diseases of biliary tract: Secondary | ICD-10-CM | POA: Diagnosis not present

## 2023-02-16 DIAGNOSIS — Z9049 Acquired absence of other specified parts of digestive tract: Secondary | ICD-10-CM | POA: Diagnosis not present

## 2023-02-16 DIAGNOSIS — K529 Noninfective gastroenteritis and colitis, unspecified: Secondary | ICD-10-CM | POA: Insufficient documentation

## 2023-02-16 HISTORY — PX: REMOVAL OF STONES: SHX5545

## 2023-02-16 HISTORY — PX: SPHINCTEROTOMY: SHX5279

## 2023-02-16 HISTORY — PX: ERCP: SHX5425

## 2023-02-16 LAB — COMPREHENSIVE METABOLIC PANEL
ALT: 231 U/L — ABNORMAL HIGH (ref 0–44)
AST: 158 U/L — ABNORMAL HIGH (ref 15–41)
Albumin: 3.2 g/dL — ABNORMAL LOW (ref 3.5–5.0)
Alkaline Phosphatase: 305 U/L — ABNORMAL HIGH (ref 38–126)
Anion gap: 6 (ref 5–15)
BUN: 8 mg/dL (ref 8–23)
CO2: 26 mmol/L (ref 22–32)
Calcium: 8.6 mg/dL — ABNORMAL LOW (ref 8.9–10.3)
Chloride: 108 mmol/L (ref 98–111)
Creatinine, Ser: 0.53 mg/dL (ref 0.44–1.00)
GFR, Estimated: >60 mL/min (ref >60)
Glucose, Bld: 120 mg/dL — ABNORMAL HIGH (ref 70–99)
Potassium: 3.2 mmol/L — ABNORMAL LOW (ref 3.5–5.1)
Sodium: 140 mmol/L (ref 135–145)
Total Bilirubin: 5.1 mg/dL — ABNORMAL HIGH (ref 0.3–1.2)
Total Protein: 5.5 g/dL — ABNORMAL LOW (ref 6.5–8.1)

## 2023-02-16 LAB — HIV ANTIBODY (ROUTINE TESTING W REFLEX): HIV Screen 4th Generation wRfx: NONREACTIVE

## 2023-02-16 LAB — CBC
HCT: 35.3 % — ABNORMAL LOW (ref 36.0–46.0)
Hemoglobin: 11.7 g/dL — ABNORMAL LOW (ref 12.0–15.0)
MCH: 30.5 pg (ref 26.0–34.0)
MCHC: 33.1 g/dL (ref 30.0–36.0)
MCV: 91.9 fL (ref 80.0–100.0)
Platelets: 256 10*3/uL (ref 150–400)
RBC: 3.84 MIL/uL — ABNORMAL LOW (ref 3.87–5.11)
RDW: 13 % (ref 11.5–15.5)
WBC: 7 10*3/uL (ref 4.0–10.5)
nRBC: 0 % (ref 0.0–0.2)

## 2023-02-16 LAB — PROCALCITONIN: Procalcitonin: 1.62 ng/mL

## 2023-02-16 SURGERY — ERCP, WITH INTERVENTION IF INDICATED
Anesthesia: General

## 2023-02-16 MED ORDER — PHENYLEPHRINE 80 MCG/ML (10ML) SYRINGE FOR IV PUSH (FOR BLOOD PRESSURE SUPPORT)
PREFILLED_SYRINGE | INTRAVENOUS | Status: DC | PRN
  Administered 2023-02-16 (×4): 160 ug via INTRAVENOUS

## 2023-02-16 MED ORDER — FENTANYL CITRATE (PF) 100 MCG/2ML IJ SOLN: 25.0000 ug | INTRAMUSCULAR | Status: DC | PRN

## 2023-02-16 MED ORDER — ONDANSETRON HCL 4 MG/2ML IJ SOLN
INTRAMUSCULAR | Status: DC | PRN
  Administered 2023-02-16: 4 mg via INTRAVENOUS

## 2023-02-16 MED ORDER — POTASSIUM CHLORIDE CRYS ER 20 MEQ PO TBCR
40.0000 meq | EXTENDED_RELEASE_TABLET | ORAL | Status: AC
  Administered 2023-02-16 (×2): 40 meq via ORAL
  Filled 2023-02-16 (×2): qty 2

## 2023-02-16 MED ORDER — DICLOFENAC SUPPOSITORY 100 MG
RECTAL | Status: DC | PRN
  Administered 2023-02-16: 100 mg via RECTAL

## 2023-02-16 MED ORDER — SUGAMMADEX SODIUM 200 MG/2ML IV SOLN
INTRAVENOUS | Status: DC | PRN
  Administered 2023-02-16: 200 mg via INTRAVENOUS

## 2023-02-16 MED ORDER — GLUCAGON HCL RDNA (DIAGNOSTIC) 1 MG IJ SOLR
INTRAMUSCULAR | Status: DC | PRN
  Administered 2023-02-16: .25 mg via INTRAVENOUS

## 2023-02-16 MED ORDER — FENTANYL CITRATE (PF) 250 MCG/5ML IJ SOLN
INTRAMUSCULAR | Status: DC | PRN
  Administered 2023-02-16: 50 ug via INTRAVENOUS

## 2023-02-16 MED ORDER — PIPERACILLIN-TAZOBACTAM 3.375 G IVPB
3.3750 g | Freq: Three times a day (TID) | INTRAVENOUS | Status: DC
  Administered 2023-02-16 – 2023-02-17 (×3): 3.375 g via INTRAVENOUS
  Filled 2023-02-16 (×4): qty 50

## 2023-02-16 MED ORDER — GLUCAGON HCL RDNA (DIAGNOSTIC) 1 MG IJ SOLR
INTRAMUSCULAR | Status: AC
  Filled 2023-02-16: qty 1

## 2023-02-16 MED ORDER — DEXAMETHASONE SODIUM PHOSPHATE 10 MG/ML IJ SOLN
INTRAMUSCULAR | Status: DC | PRN
  Administered 2023-02-16: 4 mg via INTRAVENOUS

## 2023-02-16 MED ORDER — ROCURONIUM BROMIDE 10 MG/ML (PF) SYRINGE
PREFILLED_SYRINGE | INTRAVENOUS | Status: DC | PRN
  Administered 2023-02-16: 50 mg via INTRAVENOUS

## 2023-02-16 MED ORDER — LIDOCAINE 2% (20 MG/ML) 5 ML SYRINGE
INTRAMUSCULAR | Status: DC | PRN
  Administered 2023-02-16: 60 mg via INTRAVENOUS

## 2023-02-16 MED ORDER — LACTATED RINGERS IV SOLN: INTRAVENOUS | Status: DC | PRN

## 2023-02-16 MED ORDER — SODIUM CHLORIDE 0.9 % IV SOLN
INTRAVENOUS | Status: DC | PRN
  Administered 2023-02-16: 30 mL

## 2023-02-16 MED ORDER — PROPOFOL 10 MG/ML IV BOLUS
INTRAVENOUS | Status: AC
  Filled 2023-02-16: qty 20

## 2023-02-16 MED ORDER — PHENYLEPHRINE HCL (PRESSORS) 10 MG/ML IV SOLN
INTRAVENOUS | Status: AC
  Filled 2023-02-16: qty 1

## 2023-02-16 MED ORDER — SODIUM CHLORIDE 0.9 % IV SOLN: INTRAVENOUS | Status: DC | PRN

## 2023-02-16 MED ORDER — FENTANYL CITRATE (PF) 100 MCG/2ML IJ SOLN
INTRAMUSCULAR | Status: AC
  Filled 2023-02-16: qty 2

## 2023-02-16 MED ORDER — PROPOFOL 10 MG/ML IV BOLUS
INTRAVENOUS | Status: DC | PRN
  Administered 2023-02-16: 120 mg via INTRAVENOUS

## 2023-02-16 MED ORDER — DICLOFENAC SUPPOSITORY 100 MG
RECTAL | Status: AC
  Filled 2023-02-16: qty 1

## 2023-02-16 MED ORDER — ONDANSETRON HCL 4 MG/2ML IJ SOLN: 4.0000 mg | Freq: Once | INTRAMUSCULAR | Status: DC | PRN

## 2023-02-16 NOTE — Op Note (Signed)
[facility] Patient Name: [name] Procedure Date: 02/16/2023 MRN: [id number] Attending MD: Meryl DareMalcolm T Ottie Neglia , MD, [phone] Date of Birth: Jul 22, 1951 CSN: [id number] Age: [age] Admit Type: Inpatient Procedure:                ERCP Indications:              Abdominal pain of suspected biliary origin,                            Abnormal MRCP, Elevated liver enzymes, Suspected                            choledocholithiasis Providers:                Venita LickMalcolm T. Russella DarStark, MD, Margaree MackintoshHayleigh Westmoreland, RN,                            Salley ScarletJasmine Newkirk, Technician, Romie MinusJennifer Rock, CRNA Referring MD:             [facility] Medicines:                General Anesthesia Complications:            No immediate complications. Estimated Blood Loss:     Estimated blood loss: none. Procedure:                Pre-Anesthesia Assessment:                           - Prior to the procedure, a History and Physical                            was performed, and patient medications and                            allergies were reviewed. The patient's tolerance of                            previous anesthesia was also reviewed. The risks                            and benefits of the procedure and the sedation                            options and risks were discussed with the patient.                            All questions were answered, and informed consent                            was obtained. Prior Anticoagulants: The patient has                            taken no anticoagulant or antiplatelet agents. ASA                            Grade Assessment:  II - A patient with mild systemic                            disease. After reviewing the risks and benefits,                            the patient was deemed in satisfactory condition to                            undergo the procedure.                           After obtaining informed consent, the scope was                            passed under  direct vision. Throughout the                            procedure, the patient's blood pressure, pulse, and                            oxygen saturations were monitored continuously. The                            TJF-Q190V ([id number]) Olympus duodenoscope was                            introduced through the mouth, and used to inject                            contrast into and used to inject contrast into the                            bile duct. The ERCP was accomplished without                            difficulty. The patient tolerated the procedure                            well. Scope In: Scope Out: Findings:      A scout film of the abdomen was obtained. Surgical clips, consistent       with a previous cholecystectomy, were seen in the area of the right       upper quadrant of the abdomen. The scope was advanced to the major       papilla in the descending duodenum. Prior sphinctertomy incision and a       hiatal hernia noted. Otherwise a very limited examination of the       pharynx, larynx and associated structures, and upper GI tract was       normal. A straight Roadrunner wire was passed into the biliary tree. The       short-nosed traction sphincterotome was passed over the guidewire and       the bile duct was then deeply cannulated. Contrast was injected. I       personally  interpreted the bile duct images. Ductal flow of contrast was       adequate. The common bile duct was diffusely dilated, with stones       causing an obstruction. The largest diameter was 14 mm. The left main       hepatic duct and right main hepatic duct were diffusely dilated, with       stones causing an obstruction. Air noted in the biliary tree. A       cholecystectomy had been performed. The common bile duct contained       multiple stones, the largest of which was 10 mm in diameter. The CBD       contained sludge. Biliary sphincterotomy was extended to 9 mm and made       with a traction  (standard) sphincterotome using ERBE electrocautery.       There was no post-sphincterotomy bleeding. The biliary tree was swept       several times with a 15 mm balloon starting at the bifurcation. Sludge       was swept from the duct. Multiple stones, 5-10 mm were removed. No       stones remained. Very good biliary drainage following stone removal. The       PD was not cannulated or injected by intention. Impression:               - The common bile duct was dilated, with stones                            causing an obstruction.                           - The left main hepatic duct and right main hepatic                            duct were dilated, with stones causing an                            obstruction.                           - Prior cholecystectomy.                           - Prior sphincterotomy.                           - Pneumobilia.                           - Choledocholithiasis was found, multiple stones                            and sludge. Complete removal was accomplished by                            biliary sphincterotomy extension and balloon                            extraction.                           -  A biliary sphincterotomy extension was performed.                           - Hiatal hernia. Moderate Sedation:      Not Applicable - Patient had care per Anesthesia. Recommendation:           - Return patient to hospital ward for ongoing care.                           - Avoid aspirin and nonsteroidal anti-inflammatory                            medicines for 2 weeks.                           - Observe patient's clinical course following                            today's ERCP with therapeutic intervention.                           - Trend LFTs                           - Return to GI office in 1-2 months with Dr.                            Lavon Paganini. Procedure Code(s):        --- Professional ---                           [phone], Endoscopic  retrograde                            cholangiopancreatography (ERCP); with removal of                            calculi/debris from biliary/pancreatic duct(s)                           43262, Endoscopic retrograde                            cholangiopancreatography (ERCP); with                            sphincterotomy/papillotomy Diagnosis Code(s):        --- Professional ---                           K80.51, Calculus of bile duct without cholangitis                            or cholecystitis with obstruction                           Z90.49, Acquired absence of other specified parts  of digestive tract                           R10.9, Unspecified abdominal pain                           R74.8, Abnormal levels of other serum enzymes                           R93.2, Abnormal findings on diagnostic imaging of                            liver and biliary tract CPT copyright 2022 American [employer] Association. All rights reserved. The codes documented in this report are preliminary and upon coder review may  be revised to meet current compliance requirements. Meryl Dare, MD 02/16/2023 12:20:30 PM This report has been signed electronically. Number of Addenda: 0

## 2023-02-16 NOTE — Anesthesia Preprocedure Evaluation (Addendum)
Anesthesia Evaluation  Patient identified by MRN, date of birth, ID band Patient awake    Reviewed: Allergy & Precautions, NPO status , Patient's Chart, lab work & pertinent test results  Airway Mallampati: II  TM Distance: >3 FB Neck ROM: Full    Dental  (+) Teeth Intact, Dental Advisory Given   Pulmonary neg pulmonary ROS   Pulmonary exam normal breath sounds clear to auscultation       Cardiovascular negative cardio ROS Normal cardiovascular exam Rhythm:Regular Rate:Normal     Neuro/Psych  Headaches PSYCHIATRIC DISORDERS  Depression       GI/Hepatic Neg liver ROS,GERD  Medicated,,Possible CBD stone   Endo/Other  negative endocrine ROS    Renal/GU negative Renal ROS     Musculoskeletal negative musculoskeletal ROS (+)    Abdominal   Peds  Hematology  (+) Blood dyscrasia, anemia   Anesthesia Other Findings Day of surgery medications reviewed with the patient.  Reproductive/Obstetrics                              Anesthesia Physical Anesthesia Plan  ASA: 2  Anesthesia Plan: General   Post-op Pain Management:    Induction: Intravenous  PONV Risk Score and Plan: 3 and Dexamethasone and Ondansetron  Airway Management Planned: Oral ETT  Additional Equipment:   Intra-op Plan:   Post-operative Plan: Extubation in OR  Informed Consent: I have reviewed the patients History and Physical, chart, labs and discussed the procedure including the risks, benefits and alternatives for the proposed anesthesia with the patient or authorized representative who has indicated his/her understanding and acceptance.     Dental advisory given  Plan Discussed with: CRNA  Anesthesia Plan Comments:         Anesthesia Quick Evaluation

## 2023-02-16 NOTE — Anesthesia Procedure Notes (Signed)
Procedure Name: Intubation Date/Time: 02/16/2023 11:31 AM  Performed by: Lovie Chol, CRNAPre-anesthesia Checklist: Patient identified, Emergency Drugs available, Suction available and Patient being monitored Patient Re-evaluated:Patient Re-evaluated prior to induction Oxygen Delivery Method: Circle System Utilized Preoxygenation: Pre-oxygenation with 100% oxygen Induction Type: IV induction Ventilation: Mask ventilation without difficulty Laryngoscope Size: Miller and 2 Grade View: Grade I Tube type: Oral Tube size: 7.0 mm Number of attempts: 1 Airway Equipment and Method: Stylet and Oral airway Placement Confirmation: ETT inserted through vocal cords under direct vision, positive ETCO2 and breath sounds checked- equal and bilateral Secured at: 21 cm Tube secured with: Tape Dental Injury: Teeth and Oropharynx as per pre-operative assessment

## 2023-02-16 NOTE — Progress Notes (Signed)
PROGRESS NOTE    Alexis FiguresJoyce R Coggeshall  AVW:[id number] DOB: 10-12-1951 DOA: 02/15/2023 PCP: Deeann SaintBanks, Shannon R, MD   Brief Narrative:  HPI: [name] is a 72 y.o. female with medical history significant of diverticulosis, history of laparoscopic cholecystectomy and ERCP who has been having significant episode of abdominal pain since November.  She was following up with [facility] and has had plan for MRCP down the road.  Pain has persisted.  Patient actually has had 5 attacks of the pain which is sharp rated as 10 out of 10 in the right upper quadrant.  Workup showed elevated LFTs.  Discussed with GI with recommendation for admission with plan for ERCP or MRCP.   Assessment & Plan:   Principal Problem:   Pneumobilia Active Problems:   RUQ pain   Acute colitis  Acute colitis: Interestingly, when reviewed patient's CT abdomen pelvis myself, it appears that patient has significant transverse and descending colitis.  Patient is not on any antibiotics.  This is likely infectious until it is ruled out.  Will start on Zosyn and check procalcitonin.  She is n.p.o. and we will keep her n.p.o. for now.  Pneumobilia/dilated intrahepatic and extrahepatic biliary ducts: Seen by GI, plan for ERCP today.  Hypokalemia: Will replace.  DVT prophylaxis: enoxaparin (LOVENOX) injection 40 mg Start: 02/15/23 2230   Code Status: Full Code  Family Communication: Husband present at bedside.  Plan of care discussed with patient in length and he/she verbalized understanding and agreed with it.  Status is: Inpatient Remains inpatient appropriate because: She is scheduled for ERCP today.   Estimated body mass index is 23.46 kg/m as calculated from the following:   Height as of this encounter: 5\' 5"  (1.651 m).   Weight as of this encounter: 64 kg.    Nutritional Assessment: Body mass index is 23.46 kg/m.Marland Kitchen. Seen by dietician.  I agree with the assessment and plan as outlined below: Nutrition Status:         . Skin Assessment: I have examined the patient's skin and I agree with the wound assessment as performed by the wound care RN as outlined below:    Consultants:  GI  Procedures:  As above  Antimicrobials:  Anti-infectives (From admission, onward)    None         Subjective: Patient seen and examined.  Husband at bedside.  Interestingly, patient states that she is feeling great with no abdominal pain.  She tells me that this is how it goes, she has been having intermittent pain since November, sometimes it lasts for a day and then suddenly disappears.  Objective: Vitals:   02/15/23 1848 02/15/23 2145 02/16/23 0155 02/16/23 0543  BP:  (!) 150/75 111/66 117/68  Pulse:  74 84 77  Resp:  14 14 14   Temp: 98 F (36.7 C) 97.8 F (36.6 C) 98.7 F (37.1 C) 98.5 F (36.9 C)  TempSrc: Oral Axillary Oral Oral  SpO2:  98% 95% 99%  Weight:      Height:        Intake/Output Summary (Last 24 hours) at 02/16/2023 0835 Last data filed at 02/16/2023 [id number] Gross per 24 hour  Intake 1410.32 ml  Output --  Net 1410.32 ml   Filed Weights   02/15/23 1423  Weight: 64 kg    Examination:  General exam: Appears calm and comfortable  Respiratory system: Clear to auscultation. Respiratory effort normal. Cardiovascular system: S1 & S2 heard, RRR. No JVD, murmurs, rubs, gallops or clicks.  No pedal edema. Gastrointestinal system: Abdomen is nondistended, soft and nontender. No organomegaly or masses felt. Normal bowel sounds heard. Central nervous system: Alert and oriented. No focal neurological deficits. Extremities: Symmetric 5 x 5 power. Skin: No rashes, lesions or ulcers Psychiatry: Judgement and insight appear normal. Mood & affect appropriate.    Data Reviewed: I have personally reviewed following labs and imaging studies  CBC: Recent Labs  Lab 02/15/23 1511 02/15/23 2152 02/16/23 0529  WBC 12.6* 13.1* 7.0  NEUTROABS 11.5*  --   --   HGB 14.9 13.8 11.7*  HCT 44.7 41.6  35.3*  MCV 90.9 92.2 91.9  PLT 310 336 256   Basic Metabolic Panel: Recent Labs  Lab 02/15/23 1511 02/15/23 2152 02/16/23 0529  NA 138  --  140  K 3.5  --  3.2*  CL 102  --  108  CO2 25  --  26  GLUCOSE 150*  --  120*  BUN 12  --  8  CREATININE 0.59 0.59 0.53  CALCIUM 9.6  --  8.6*   GFR: Estimated Creatinine Clearance: 58.9 mL/min (by C-G formula based on SCr of 0.53 mg/dL). Liver Function Tests: Recent Labs  Lab 02/15/23 1511 02/16/23 0529  AST 258* 158*  ALT 353* 231*  ALKPHOS 420* 305*  BILITOT 4.1* 5.1*  PROT 7.4 5.5*  ALBUMIN 4.4 3.2*   Recent Labs  Lab 02/15/23 1511  LIPASE 30   No results for input(s): "AMMONIA" in the last 168 hours. Coagulation Profile: No results for input(s): "INR", "PROTIME" in the last 168 hours. Cardiac Enzymes: No results for input(s): "CKTOTAL", "CKMB", "CKMBINDEX", "TROPONINI" in the last 168 hours. BNP (last 3 results) No results for input(s): "PROBNP" in the last 8760 hours. HbA1C: No results for input(s): "HGBA1C" in the last 72 hours. CBG: No results for input(s): "GLUCAP" in the last 168 hours. Lipid Profile: No results for input(s): "CHOL", "HDL", "LDLCALC", "TRIG", "CHOLHDL", "LDLDIRECT" in the last 72 hours. Thyroid Function Tests: No results for input(s): "TSH", "T4TOTAL", "FREET4", "T3FREE", "THYROIDAB" in the last 72 hours. Anemia Panel: No results for input(s): "VITAMINB12", "FOLATE", "FERRITIN", "TIBC", "IRON", "RETICCTPCT" in the last 72 hours. Sepsis Labs: No results for input(s): "PROCALCITON", "LATICACIDVEN" in the last 168 hours.  No results found for this or any previous visit (from the past 240 hour(s)).   Radiology Studies: CT ABDOMEN PELVIS W CONTRAST  Result Date: 02/15/2023 CLINICAL DATA:  Abdominal pain, acute, nonlocalized EXAM: CT ABDOMEN AND PELVIS WITH CONTRAST TECHNIQUE: Multidetector CT imaging of the abdomen and pelvis was performed using the standard protocol following bolus  administration of intravenous contrast. RADIATION DOSE REDUCTION: This exam was performed according to the departmental dose-optimization program which includes automated exposure control, adjustment of the mA and/or kV according to patient size and/or use of iterative reconstruction technique. CONTRAST:  OMNIPAQUE IOHEXOL 300 MG/ML  SOLN COMPARISON:  CT 10/09/2022, MRI 11/24/2022 FINDINGS: Lower chest: Bibasilar scarring or atelectasis. Heart size is normal. Small hiatal hernia. Hepatobiliary: Large subcapsular cyst at the right hepatic dome measures approximately 12.4 x 7.0 x 10.0 cm, unchanged from prior. Additional smaller cyst within the left hepatic lobe. No new focal liver lesion. Intra and extrahepatic biliary dilatation with pneumobilia. Common bile duct is dilated up to 1.4 cm, previously 1.1 cm. No obstructive etiology is identified. Pancreas: Unremarkable. No pancreatic ductal dilatation or surrounding inflammatory changes. Spleen: Normal in size without focal abnormality. Adrenals/Urinary Tract: Unremarkable adrenal glands. Kidneys enhance symmetrically without focal lesion, stone, or hydronephrosis.  Ureters are nondilated. Urinary bladder appears unremarkable for the degree of distention. Stomach/Bowel: Small hiatal hernia. Stomach otherwise within normal limits. No abnormally dilated loops of bowel. Normal appendix in the right lower quadrant. Scattered sigmoid diverticulosis. Long segment of mild wall thickening of the left colon involving the distal transverse colon, descending, and sigmoid colon. No pericolonic fat stranding. Vascular/Lymphatic: No significant vascular findings are present. No enlarged abdominal or pelvic lymph nodes. Reproductive: Uterus and bilateral adnexa are unremarkable. Other: No free fluid. No abdominopelvic fluid collection. No pneumoperitoneum. No abdominal wall hernia. Musculoskeletal: No acute or significant osseous findings. Prior L4-5 posterior and interbody  fusion. IMPRESSION: 1. Long segment of mild wall thickening of the left colon involving the distal transverse colon, descending, and sigmoid colon, suggestive of a nonspecific colitis. 2. Intra- and extra-hepatic biliary dilatation with pneumobilia. Common bile duct is dilated up to 1.4 cm, previously 1.1 cm. No obstructive etiology is identified. Correlate with LFTs and consider follow-up nonemergent ERCP as clinically indicated. 3. Stable large subcapsular cyst at the right hepatic dome measuring approximately 12 cm. This lesion may be symptomatic due to local mass effect. 4. Small hiatal hernia. Electronically Signed   By: Duanne GuessNicholas  Plundo D.O.   On: 02/15/2023 17:55    Scheduled Meds:  enoxaparin (LOVENOX) injection  40 mg Subcutaneous Q24H    HYDROmorphone (DILAUDID) injection  0.5 mg Intravenous Once   potassium chloride  40 mEq Oral Q4H   Continuous Infusions:  dextrose 5% lactated ringers 125 mL/hr at 02/16/23 0607     LOS: 1 day   Hughie Clossavi Karlen Barbar, MD [facility]  02/16/2023, 8:35 AM   *Please note that this is a verbal dictation therefore any spelling or grammatical errors are due to the "Dragon [employer] One" system interpretation.  Please page via Amion and do not message via secure chat for urgent patient care matters. Secure chat can be used for non urgent patient care matters.  How to contact the [facility] Attending or Consulting provider 7A - 7P or covering provider during after hours 7P -7A, for this patient?  Check the care team in [facility] and look for a) attending/consulting [facility] provider listed and b) the [facility] team listed. Page or secure chat 7A-7P. Log into [email or web address] and use 's universal password to access. If you do not have the password, please contact the hospital operator. Locate the [facility] provider you are looking for under [facility] and page to a number that you can be directly reached. If you still have difficulty reaching the provider, please page the  [facility] (Director on Call) for the Hospitalists listed on amion for assistance.

## 2023-02-16 NOTE — Interval H&P Note (Signed)
History and Physical Interval Note:  02/16/2023 11:15 AM  [name]  has presented today for surgery, with the diagnosis of R/O CBD stone.  The various methods of treatment have been discussed with the patient and family. After consideration of risks, benefits and other options for treatment, the patient has consented to  Procedure(s): ENDOSCOPIC RETROGRADE CHOLANGIOPANCREATOGRAPHY (ERCP) (N/A) as a surgical intervention.  The patient's history has been reviewed, patient examined, no change in status, stable for surgery.  I have reviewed the patient's chart and labs.  Questions were answered to the patient's satisfaction.     Venita Lick. Russella Dar

## 2023-02-16 NOTE — H&P (View-Only) (Signed)
Consult Note   Referring Provider: [facility] Primary Care Physician:  Deeann SaintBanks, Shannon R, MD Primary Gastroenterologist:  Scherry RanVeena Nandigam, MD  Reason for Consultation:  Elevated LFTs, epigastric pain   Assessment    Suspected choledocholithiasis.  R/O other biliary obstructive process. S/P cholecystectomy and ERCP with sphincterotomy in 2012 Abnormal CT of the colon with mild wall thickening in the distal transverse, descending and sigmoid colon without associated symptoms.  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 The risks (including pancreatitis, bleeding, perforation, infection, missed lesions, medication reactions and possible hospitalization or surgery if complications occur), benefits, and alternatives to ERCP with possible sphincterotomy, possible stone extraction, possible stent placement were discussed with the patient and they consent to proceed.   Trend LFTs    HPI: [name] is a 72 y.o. female with recurrent epigastric, upper abdominal pain often associated with nausea and vomiting.  Symptoms have been intermittent for the past few months and they typically last several hours following meals.  Intermittently her urine has been dark and intermittently her LFTs have been elevated.  MRCP performed in January 2024: prior cholecystectomy, pneumobilia, CBD 1.1 cm, moderate sized hiatal hernia, simple appearing large hepatic cyst, no biliary obstructive etiology identified.  Yesterday she had a more severe episode of upper abdominal pain associated with nausea and vomiting that brought her to the hospital.  LFTs were again elevated. CT AP yesterday showed intra and extrahepatic biliary dilation with pneumobilia, common bile duct 1.4 cm (previously 1.1 cm), no obstructive etiology identified.  She underwent ERCP with sphincterotomy with multiple CBD stones removed in 2012 and cholecystectomy in 2012.  Additionally a  long segment of mild wall thickening was noted in the distal transverse colon, descending colon and sigmoid colon and a stable large hepatic cyst noted.    Colonoscopy in October 2020: mild sigmoid colon diverticulosis and internal hemorrhoids.    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   Past Medical History:  Diagnosis Date   ABDOMINAL PAIN 05/01/2010   DEPRESSION 07/21/2007   DIVERTICULOSIS, COLON 07/26/2008   Headache(784.0) 07/26/2008   Pityriasis 09/13/2019    Past Surgical History:  Procedure Laterality Date   COLONOSCOPY     ERCP  august 2012   LAPAROSCOPIC CHOLECYSTECTOMY  08/14/2011   WISDOM TOOTH EXTRACTION      Prior to Admission medications   Medication Sig Start Date End Date Taking? Authorizing Provider  acyclovir (ZOVIRAX) 200 MG capsule TAKE 1 CAPSULE BY MOUTH EVERY DAY 12/03/22   Deeann SaintBanks, Shannon R, MD  escitalopram (LEXAPRO) 20 MG tablet TAKE 1 TABLET BY MOUTH EVERY DAY IN THE MORNING 04/04/22   Deeann SaintBanks, Shannon R, MD  Multiple Vitamins-Minerals (MULTIVITAMIN GUMMIES ADULT PO) Take 1 tablet by mouth daily.    [provider]  omeprazole (PRILOSEC) 20 MG capsule TAKE 1 CAPSULE BY MOUTH EVERY DAY 11/28/22   Deeann SaintBanks, Shannon R, MD  traMADol (ULTRAM) 50 MG tablet Take 1 tablet (50 mg total) by mouth 2 (two) times daily. 02/06/23   Napoleon FormNandigam, Kavitha V, MD    Current Facility-Administered Medications  Medication Dose Route Frequency Provider Last Rate Last Admin   dextrose 5 % in lactated ringers infusion   Intravenous Continuous Rometta EmeryGarba, Mohammad L, MD 125 mL/hr at 02/16/23 0607 New Bag at 02/16/23 0607   enoxaparin (LOVENOX) injection 40 mg  40 mg Subcutaneous Q24H  Rometta Emery, MD   40 mg at 02/15/23 2200   HYDROmorphone (DILAUDID) injection 0.5 mg  0.5 mg Intravenous Once Bethann Berkshire, MD       HYDROmorphone (DILAUDID) injection 1 mg  1 mg Intravenous Q2H PRN Rometta Emery, MD   1 mg at 02/16/23 0607   ondansetron (ZOFRAN) tablet 4 mg  4 mg Oral Q6H PRN Rometta Emery, MD       Or   ondansetron (ZOFRAN) injection 4 mg  4 mg Intravenous Q6H PRN Mikeal Hawthorne, Mohammad L, MD       piperacillin-tazobactam (ZOSYN) IVPB 3.375 g  3.375 g Intravenous Q8H Ellington, Abby K, RPH       potassium chloride SA (KLOR-CON M) CR tablet 40 mEq  40 mEq Oral Q4H Hughie Closs, MD        Allergies as of 02/15/2023 - Review Complete 02/15/2023  Allergen Reaction Noted   Sulfur Hives 01/02/2013   Sulfamethoxazole Hives 02/07/2007    Family History  Problem Relation Age of Onset   Diabetes Mother    Heart disease Mother    Obesity Mother    Dementia Mother    Heart disease Father    Stroke Father    Colon cancer Paternal Aunt    Other Paternal Aunt        esophageal stricture   Esophageal cancer Neg Hx    Rectal cancer Neg Hx    Stomach cancer Neg Hx     Social History   Socioeconomic History   Marital status: Married    Spouse name: Not on file   Number of children: Not on file   Years of education: Not on file   Highest education level: Associate degree: occupational, Scientist, product/process development, or vocational program  Occupational History   Not on file  Tobacco Use   Smoking status: Never   Smokeless tobacco: Never  Vaping Use   Vaping Use: Never used  Substance and Sexual Activity   Alcohol use: No   Drug use: No   Sexual activity: Not on file  Other Topics Concern   Not on file  Social History Narrative   Not on file   Social Determinants of Health   Financial Resource Strain: Low Risk  (07/18/2022)   Overall Financial Resource Strain (CARDIA)    Difficulty of Paying Living Expenses: Not hard at all  Food Insecurity: No Food Insecurity (07/18/2022)   Hunger Vital Sign    Worried About Running Out of Food in the Last Year: Never true    Ran Out of Food in the Last Year: Never true  Transportation Needs: No Transportation Needs (07/18/2022)   PRAPARE -  Administrator, Civil Service (Medical): No    Lack of Transportation (Non-Medical): No  Physical Activity: Insufficiently Active (07/18/2022)   Exercise Vital Sign    Days of Exercise per Week: 2 days    Minutes of Exercise per Session: 20 min  Stress: No Stress Concern Present (07/18/2022)   [employer] of Occupational Health - Occupational Stress Questionnaire    Feeling of Stress : Not at all  Social Connections: Socially Integrated (07/18/2022)   Social Connection and Isolation Panel [NHANES]    Frequency of Communication with Friends and Family: More than three times a week    Frequency of Social Gatherings with Friends and Family: More than three times a week    Attends Religious Services: More than 4 times per year    Active Member of  Clubs or Organizations: Yes    Attends Banker Meetings: More than 4 times per year    Marital Status: Married  Catering manager Violence: Not At Risk (07/18/2022)   Humiliation, Afraid, Rape, and Kick questionnaire    Fear of Current or Ex-Partner: No    Emotionally Abused: No    Physically Abused: No    Sexually Abused: No    Review of Systems: Gen: Denies any fever, chills, sweats, anorexia, fatigue, weakness, malaise, weight loss, and sleep disorder CV: Denies chest pain, angina, palpitations, syncope, orthopnea, PND, peripheral edema, and claudication. Resp: Denies dyspnea at rest, dyspnea with exercise, cough, sputum, wheezing, coughing up blood, and pleurisy. GI: Denies vomiting blood, jaundice, and fecal incontinence.   Denies dysphagia or odynophagia. GU : Denies urinary burning, blood in urine, urinary frequency, urinary hesitancy, nocturnal urination, and urinary incontinence. MS: Denies joint pain, limitation of movement, and swelling, stiffness, low back pain, extremity pain. Denies muscle weakness, cramps, atrophy.  Derm: Denies rash, itching, dry skin, hives, moles, warts, or unhealing ulcers.  Psych: Denies  depression, anxiety, memory loss, suicidal ideation, hallucinations, paranoia, and confusion. Heme: Denies bruising, bleeding, and enlarged lymph nodes. Neuro:  Denies any headaches, dizziness, paresthesias. Endo:  Denies any problems with DM, thyroid, adrenal function.  Physical Exam: Vital signs in last 24 hours: Temp:  [97.8 F (36.6 C)-98.7 F (37.1 C)] 98.5 F (36.9 C) (04/06 0543) Pulse Rate:  [72-84] 77 (04/06 0543) Resp:  [14-17] 14 (04/06 0543) BP: (111-150)/(66-83) 117/68 (04/06 0543) SpO2:  [95 %-100 %] 99 % (04/06 0543) Weight:  [51 kg] 64 kg (04/05 1423) Last BM Date : 02/15/23  General:  Alert, well-developed, well-nourished, in NAD Head:  Normocephalic and atraumatic. Eyes:  Sclera icterus. Conjunctiva pink. Ears:  Normal auditory acuity. Nose:  No deformity, discharge, or lesions. Mouth:  No deformity or lesions. Oropharynx pink & moist. Neck:  Supple; no masses or thyromegaly. Chest:  Clear throughout to auscultation. No wheezes, crackles, or rhonchi. No acute distress. Heart:  Regular rate and rhythm; no murmurs, clicks, rubs, or gallops. Abdomen:  Soft, nontender and nondistended. No masses, hepatosplenomegaly or hernias noted. Normal bowel sounds, without guarding, and without rebound.   Rectal:  Deferred until time of colonoscopy.   Msk:  Symmetrical without gross deformities. Normal posture. Pulses:  Normal pulses noted. Extremities:  Without clubbing or edema. Neurologic:  Alert and  oriented x4;  grossly normal neurologically. Skin:  Intact without significant lesions or rashes. Cervical Nodes:  No significant cervical adenopathy. Psych:  Alert and cooperative. Normal mood and affect.  Intake/Output from previous day: 04/05 0701 - 04/06 0700 In: 1410.3 [I.V.:910.3; IV Piggyback:500] Out: -  Intake/Output this shift: No intake/output data recorded.  Previous Endoscopies: See HPI  Lab Results: Recent Labs    02/15/23 1511 02/15/23 2152  02/16/23 0529  WBC 12.6* 13.1* 7.0  HGB 14.9 13.8 11.7*  HCT 44.7 41.6 35.3*  PLT 310 336 256   BMET Recent Labs    02/15/23 1511 02/15/23 2152 02/16/23 0529  NA 138  --  140  K 3.5  --  3.2*  CL 102  --  108  CO2 25  --  26  GLUCOSE 150*  --  120*  BUN 12  --  8  CREATININE 0.59 0.59 0.53  CALCIUM 9.6  --  8.6*   LFT Recent Labs    02/16/23 0529  PROT 5.5*  ALBUMIN 3.2*  AST 158*  ALT 231*  ALKPHOS  305*  BILITOT 5.1*    Studies/Results: CT ABDOMEN PELVIS W CONTRAST  Result Date: 02/15/2023 CLINICAL DATA:  Abdominal pain, acute, nonlocalized EXAM: CT ABDOMEN AND PELVIS WITH CONTRAST TECHNIQUE: Multidetector CT imaging of the abdomen and pelvis was performed using the standard protocol following bolus administration of intravenous contrast. RADIATION DOSE REDUCTION: This exam was performed according to the departmental dose-optimization program which includes automated exposure control, adjustment of the mA and/or kV according to patient size and/or use of iterative reconstruction technique. CONTRAST:  OMNIPAQUE IOHEXOL 300 MG/ML  SOLN COMPARISON:  CT 10/09/2022, MRI 11/24/2022 FINDINGS: Lower chest: Bibasilar scarring or atelectasis. Heart size is normal. Small hiatal hernia. Hepatobiliary: Large subcapsular cyst at the right hepatic dome measures approximately 12.4 x 7.0 x 10.0 cm, unchanged from prior. Additional smaller cyst within the left hepatic lobe. No new focal liver lesion. Intra and extrahepatic biliary dilatation with pneumobilia. Common bile duct is dilated up to 1.4 cm, previously 1.1 cm. No obstructive etiology is identified. Pancreas: Unremarkable. No pancreatic ductal dilatation or surrounding inflammatory changes. Spleen: Normal in size without focal abnormality. Adrenals/Urinary Tract: Unremarkable adrenal glands. Kidneys enhance symmetrically without focal lesion, stone, or hydronephrosis. Ureters are nondilated. Urinary bladder appears unremarkable for  the degree of distention. Stomach/Bowel: Small hiatal hernia. Stomach otherwise within normal limits. No abnormally dilated loops of bowel. Normal appendix in the right lower quadrant. Scattered sigmoid diverticulosis. Long segment of mild wall thickening of the left colon involving the distal transverse colon, descending, and sigmoid colon. No pericolonic fat stranding. Vascular/Lymphatic: No significant vascular findings are present. No enlarged abdominal or pelvic lymph nodes. Reproductive: Uterus and bilateral adnexa are unremarkable. Other: No free fluid. No abdominopelvic fluid collection. No pneumoperitoneum. No abdominal wall hernia. Musculoskeletal: No acute or significant osseous findings. Prior L4-5 posterior and interbody fusion. IMPRESSION: 1. Long segment of mild wall thickening of the left colon involving the distal transverse colon, descending, and sigmoid colon, suggestive of a nonspecific colitis. 2. Intra- and extra-hepatic biliary dilatation with pneumobilia. Common bile duct is dilated up to 1.4 cm, previously 1.1 cm. No obstructive etiology is identified. Correlate with LFTs and consider follow-up nonemergent ERCP as clinically indicated. 3. Stable large subcapsular cyst at the right hepatic dome measuring approximately 12 cm. This lesion may be symptomatic due to local mass effect. 4. Small hiatal hernia. Electronically Signed   By: Duanne Guess D.O.   On: 02/15/2023 17:55      LOS: 1 day   Jaselle Pryer T. Russella Dar, MD  02/16/2023, 8:43 AM See Loretha Stapler, [facility], to contact our on call provider

## 2023-02-16 NOTE — Progress Notes (Signed)
Pharmacy Antibiotic Note  DEBI TACK is a 72 y.o. female admitted on 02/15/2023 with abdominal pain. Pharmacy has been consulted for Zosyn dosing for intra-abdominal infection.  Plan: -Zosyn 3.375 g IV q8h extended infusion  Height: 5\' 5"  (165.1 cm) Weight: 64 kg (141 lb) IBW/kg (Calculated) : 57  Temp (24hrs), Avg:98.2 F (36.8 C), Min:97.8 F (36.6 C), Max:98.7 F (37.1 C)  Recent Labs  Lab 02/15/23 1511 02/15/23 2152 02/16/23 0529  WBC 12.6* 13.1* 7.0  CREATININE 0.59 0.59 0.53    Estimated Creatinine Clearance: 58.9 mL/min (by C-G formula based on SCr of 0.53 mg/dL).    Allergies  Allergen Reactions   Sulfur Hives   Sulfamethoxazole Hives    Happened in childhood, does not remember reaction.    Antimicrobials this admission: Zosyn 4/6 >>   Microbiology results: NA  Thank you for allowing pharmacy to be a part of this patient's care.  Pricilla Riffle, PharmD, BCPS Clinical Pharmacist 02/16/2023 8:41 AM

## 2023-02-16 NOTE — Anesthesia Postprocedure Evaluation (Signed)
Anesthesia Post Note  Patient: [name]  Procedure(s) Performed: ENDOSCOPIC RETROGRADE CHOLANGIOPANCREATOGRAPHY (ERCP) SPHINCTEROTOMY REMOVAL OF STONES     Patient location during evaluation: Endoscopy Anesthesia Type: General Level of consciousness: awake and alert Pain management: pain level controlled Vital Signs Assessment: post-procedure vital signs reviewed and stable Respiratory status: spontaneous breathing, nonlabored ventilation, respiratory function stable and patient connected to nasal cannula oxygen Cardiovascular status: blood pressure returned to baseline and stable Postop Assessment: no apparent nausea or vomiting Anesthetic complications: no   No notable events documented.  Last Vitals:  Vitals:   02/16/23 1300 02/16/23 1407  BP: 128/68 112/75  Pulse: 81 70  Resp: 16 14  Temp: 36.5 C 36.8 C  SpO2: 97% 98%    Last Pain:  Vitals:   02/16/23 1407  TempSrc: Oral  PainSc:                  Collene Schlichter

## 2023-02-16 NOTE — Consult Note (Signed)
 Consult Note   Referring Provider: [facility] Primary Care Physician:  Banks, Shannon R, MD Primary Gastroenterologist:  Veena Nandigam, MD  Reason for Consultation:  Elevated LFTs, epigastric pain   Assessment    Suspected choledocholithiasis.  R/O other biliary obstructive process. S/P cholecystectomy and ERCP with sphincterotomy in 2012 Abnormal CT of the colon with mild wall thickening in the distal transverse, descending and sigmoid colon without associated symptoms.  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 The risks (including pancreatitis, bleeding, perforation, infection, missed lesions, medication reactions and possible hospitalization or surgery if complications occur), benefits, and alternatives to ERCP with possible sphincterotomy, possible stone extraction, possible stent placement were discussed with the patient and they consent to proceed.   Trend LFTs    HPI: [name] is a 71 y.o. female with recurrent epigastric, upper abdominal pain often associated with nausea and vomiting.  Symptoms have been intermittent for the past few months and they typically last several hours following meals.  Intermittently her urine has been dark and intermittently her LFTs have been elevated.  MRCP performed in January 2024: prior cholecystectomy, pneumobilia, CBD 1.1 cm, moderate sized hiatal hernia, simple appearing large hepatic cyst, no biliary obstructive etiology identified.  Yesterday she had a more severe episode of upper abdominal pain associated with nausea and vomiting that brought her to the hospital.  LFTs were again elevated. CT AP yesterday showed intra and extrahepatic biliary dilation with pneumobilia, common bile duct 1.4 cm (previously 1.1 cm), no obstructive etiology identified.  She underwent ERCP with sphincterotomy with multiple CBD stones removed in 2012 and cholecystectomy in 2012.  Additionally a  long segment of mild wall thickening was noted in the distal transverse colon, descending colon and sigmoid colon and a stable large hepatic cyst noted.    Colonoscopy in October 2020: mild sigmoid colon diverticulosis and internal hemorrhoids.    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   Past Medical History:  Diagnosis Date   ABDOMINAL PAIN 05/01/2010   DEPRESSION 07/21/2007   DIVERTICULOSIS, COLON 07/26/2008   Headache(784.0) 07/26/2008   Pityriasis 09/13/2019    Past Surgical History:  Procedure Laterality Date   COLONOSCOPY     ERCP  august 2012   LAPAROSCOPIC CHOLECYSTECTOMY  08/14/2011   WISDOM TOOTH EXTRACTION      Prior to Admission medications   Medication Sig Start Date End Date Taking? Authorizing Provider  acyclovir (ZOVIRAX) 200 MG capsule TAKE 1 CAPSULE BY MOUTH EVERY DAY 12/03/22   Banks, Shannon R, MD  escitalopram (LEXAPRO) 20 MG tablet TAKE 1 TABLET BY MOUTH EVERY DAY IN THE MORNING 04/04/22   Banks, Shannon R, MD  Multiple Vitamins-Minerals (MULTIVITAMIN GUMMIES ADULT PO) Take 1 tablet by mouth daily.    [provider]  omeprazole (PRILOSEC) 20 MG capsule TAKE 1 CAPSULE BY MOUTH EVERY DAY 11/28/22   Banks, Shannon R, MD  traMADol (ULTRAM) 50 MG tablet Take 1 tablet (50 mg total) by mouth 2 (two) times daily. 02/06/23   Nandigam, Kavitha V, MD    Current Facility-Administered Medications  Medication Dose Route Frequency Provider Last Rate Last Admin   dextrose 5 % in lactated ringers infusion   Intravenous Continuous Garba, Mohammad L, MD 125 mL/hr at 02/16/23 0607 New Bag at 02/16/23 0607   enoxaparin (LOVENOX) injection 40 mg  40 mg Subcutaneous Q24H   Garba, Mohammad L, MD   40 mg at 02/15/23 2200   HYDROmorphone (DILAUDID) injection 0.5 mg  0.5 mg Intravenous Once Zammit, Joseph, MD       HYDROmorphone (DILAUDID) injection 1 mg  1 mg Intravenous Q2H PRN Garba,  Mohammad L, MD   1 mg at 02/16/23 0607   ondansetron (ZOFRAN) tablet 4 mg  4 mg Oral Q6H PRN Garba, Mohammad L, MD       Or   ondansetron (ZOFRAN) injection 4 mg  4 mg Intravenous Q6H PRN Garba, Mohammad L, MD       piperacillin-tazobactam (ZOSYN) IVPB 3.375 g  3.375 g Intravenous Q8H Ellington, Abby K, RPH       potassium chloride SA (KLOR-CON M) CR tablet 40 mEq  40 mEq Oral Q4H Pahwani, Ravi, MD        Allergies as of 02/15/2023 - Review Complete 02/15/2023  Allergen Reaction Noted   Sulfur Hives 01/02/2013   Sulfamethoxazole Hives 02/07/2007    Family History  Problem Relation Age of Onset   Diabetes Mother    Heart disease Mother    Obesity Mother    Dementia Mother    Heart disease Father    Stroke Father    Colon cancer Paternal Aunt    Other Paternal Aunt        esophageal stricture   Esophageal cancer Neg Hx    Rectal cancer Neg Hx    Stomach cancer Neg Hx     Social History   Socioeconomic History   Marital status: Married    Spouse name: Not on file   Number of children: Not on file   Years of education: Not on file   Highest education level: Associate degree: occupational, technical, or vocational program  Occupational History   Not on file  Tobacco Use   Smoking status: Never   Smokeless tobacco: Never  Vaping Use   Vaping Use: Never used  Substance and Sexual Activity   Alcohol use: No   Drug use: No   Sexual activity: Not on file  Other Topics Concern   Not on file  Social History Narrative   Not on file   Social Determinants of Health   Financial Resource Strain: Low Risk  (07/18/2022)   Overall Financial Resource Strain (CARDIA)    Difficulty of Paying Living Expenses: Not hard at all  Food Insecurity: No Food Insecurity (07/18/2022)   Hunger Vital Sign    Worried About Running Out of Food in the Last Year: Never true    Ran Out of Food in the Last Year: Never true  Transportation Needs: No Transportation Needs (07/18/2022)   PRAPARE -  Transportation    Lack of Transportation (Medical): No    Lack of Transportation (Non-Medical): No  Physical Activity: Insufficiently Active (07/18/2022)   Exercise Vital Sign    Days of Exercise per Week: 2 days    Minutes of Exercise per Session: 20 min  Stress: No Stress Concern Present (07/18/2022)   [employer] - Occupational Stress Questionnaire    Feeling of Stress : Not at all  Social Connections: Socially Integrated (07/18/2022)   Social Connection and Isolation Panel [NHANES]    Frequency of Communication with Friends and Family: More than three times a week    Frequency of Social Gatherings with Friends and Family: More than three times a week    Attends Religious Services: More than 4 times per year    Active Member of   Clubs or Organizations: Yes    Attends Club or Organization Meetings: More than 4 times per year    Marital Status: Married  Intimate Partner Violence: Not At Risk (07/18/2022)   Humiliation, Afraid, Rape, and Kick questionnaire    Fear of Current or Ex-Partner: No    Emotionally Abused: No    Physically Abused: No    Sexually Abused: No    Review of Systems: Gen: Denies any fever, chills, sweats, anorexia, fatigue, weakness, malaise, weight loss, and sleep disorder CV: Denies chest pain, angina, palpitations, syncope, orthopnea, PND, peripheral edema, and claudication. Resp: Denies dyspnea at rest, dyspnea with exercise, cough, sputum, wheezing, coughing up blood, and pleurisy. GI: Denies vomiting blood, jaundice, and fecal incontinence.   Denies dysphagia or odynophagia. GU : Denies urinary burning, blood in urine, urinary frequency, urinary hesitancy, nocturnal urination, and urinary incontinence. MS: Denies joint pain, limitation of movement, and swelling, stiffness, low back pain, extremity pain. Denies muscle weakness, cramps, atrophy.  Derm: Denies rash, itching, dry skin, hives, moles, warts, or unhealing ulcers.  Psych: Denies  depression, anxiety, memory loss, suicidal ideation, hallucinations, paranoia, and confusion. Heme: Denies bruising, bleeding, and enlarged lymph nodes. Neuro:  Denies any headaches, dizziness, paresthesias. Endo:  Denies any problems with DM, thyroid, adrenal function.  Physical Exam: Vital signs in last 24 hours: Temp:  [97.8 F (36.6 C)-98.7 F (37.1 C)] 98.5 F (36.9 C) (04/06 0543) Pulse Rate:  [72-84] 77 (04/06 0543) Resp:  [14-17] 14 (04/06 0543) BP: (111-150)/(66-83) 117/68 (04/06 0543) SpO2:  [95 %-100 %] 99 % (04/06 0543) Weight:  [64 kg] 64 kg (04/05 1423) Last BM Date : 02/15/23  General:  Alert, well-developed, well-nourished, in NAD Head:  Normocephalic and atraumatic. Eyes:  Sclera icterus. Conjunctiva pink. Ears:  Normal auditory acuity. Nose:  No deformity, discharge, or lesions. Mouth:  No deformity or lesions. Oropharynx pink & moist. Neck:  Supple; no masses or thyromegaly. Chest:  Clear throughout to auscultation. No wheezes, crackles, or rhonchi. No acute distress. Heart:  Regular rate and rhythm; no murmurs, clicks, rubs, or gallops. Abdomen:  Soft, nontender and nondistended. No masses, hepatosplenomegaly or hernias noted. Normal bowel sounds, without guarding, and without rebound.   Rectal:  Deferred until time of colonoscopy.   Msk:  Symmetrical without gross deformities. Normal posture. Pulses:  Normal pulses noted. Extremities:  Without clubbing or edema. Neurologic:  Alert and  oriented x4;  grossly normal neurologically. Skin:  Intact without significant lesions or rashes. Cervical Nodes:  No significant cervical adenopathy. Psych:  Alert and cooperative. Normal mood and affect.  Intake/Output from previous day: 04/05 0701 - 04/06 0700 In: 1410.3 [I.V.:910.3; IV Piggyback:500] Out: -  Intake/Output this shift: No intake/output data recorded.  Previous Endoscopies: See HPI  Lab Results: Recent Labs    02/15/23 1511 02/15/23 2152  02/16/23 0529  WBC 12.6* 13.1* 7.0  HGB 14.9 13.8 11.7*  HCT 44.7 41.6 35.3*  PLT 310 336 256   BMET Recent Labs    02/15/23 1511 02/15/23 2152 02/16/23 0529  NA 138  --  140  K 3.5  --  3.2*  CL 102  --  108  CO2 25  --  26  GLUCOSE 150*  --  120*  BUN 12  --  8  CREATININE 0.59 0.59 0.53  CALCIUM 9.6  --  8.6*   LFT Recent Labs    02/16/23 0529  PROT 5.5*  ALBUMIN 3.2*  AST 158*  ALT 231*  ALKPHOS   305*  BILITOT 5.1*    Studies/Results: CT ABDOMEN PELVIS W CONTRAST  Result Date: 02/15/2023 CLINICAL DATA:  Abdominal pain, acute, nonlocalized EXAM: CT ABDOMEN AND PELVIS WITH CONTRAST TECHNIQUE: Multidetector CT imaging of the abdomen and pelvis was performed using the standard protocol following bolus administration of intravenous contrast. RADIATION DOSE REDUCTION: This exam was performed according to the departmental dose-optimization program which includes automated exposure control, adjustment of the mA and/or kV according to patient size and/or use of iterative reconstruction technique. CONTRAST:  100mL OMNIPAQUE IOHEXOL 300 MG/ML  SOLN COMPARISON:  CT 10/09/2022, MRI 11/24/2022 FINDINGS: Lower chest: Bibasilar scarring or atelectasis. Heart size is normal. Small hiatal hernia. Hepatobiliary: Large subcapsular cyst at the right hepatic dome measures approximately 12.4 x 7.0 x 10.0 cm, unchanged from prior. Additional smaller cyst within the left hepatic lobe. No new focal liver lesion. Intra and extrahepatic biliary dilatation with pneumobilia. Common bile duct is dilated up to 1.4 cm, previously 1.1 cm. No obstructive etiology is identified. Pancreas: Unremarkable. No pancreatic ductal dilatation or surrounding inflammatory changes. Spleen: Normal in size without focal abnormality. Adrenals/Urinary Tract: Unremarkable adrenal glands. Kidneys enhance symmetrically without focal lesion, stone, or hydronephrosis. Ureters are nondilated. Urinary bladder appears unremarkable for  the degree of distention. Stomach/Bowel: Small hiatal hernia. Stomach otherwise within normal limits. No abnormally dilated loops of bowel. Normal appendix in the right lower quadrant. Scattered sigmoid diverticulosis. Long segment of mild wall thickening of the left colon involving the distal transverse colon, descending, and sigmoid colon. No pericolonic fat stranding. Vascular/Lymphatic: No significant vascular findings are present. No enlarged abdominal or pelvic lymph nodes. Reproductive: Uterus and bilateral adnexa are unremarkable. Other: No free fluid. No abdominopelvic fluid collection. No pneumoperitoneum. No abdominal wall hernia. Musculoskeletal: No acute or significant osseous findings. Prior L4-5 posterior and interbody fusion. IMPRESSION: 1. Long segment of mild wall thickening of the left colon involving the distal transverse colon, descending, and sigmoid colon, suggestive of a nonspecific colitis. 2. Intra- and extra-hepatic biliary dilatation with pneumobilia. Common bile duct is dilated up to 1.4 cm, previously 1.1 cm. No obstructive etiology is identified. Correlate with LFTs and consider follow-up nonemergent ERCP as clinically indicated. 3. Stable large subcapsular cyst at the right hepatic dome measuring approximately 12 cm. This lesion may be symptomatic due to local mass effect. 4. Small hiatal hernia. Electronically Signed   By: Nicholas  Plundo D.O.   On: 02/15/2023 17:55      LOS: 1 day   Latora Quarry T. Jasmine Mcbeth, MD  02/16/2023, 8:43 AM See [facility], [facility], to contact our on call provider   

## 2023-02-16 NOTE — Transfer of Care (Signed)
Immediate Anesthesia Transfer of Care Note  Patient: [name]  Procedure(s) Performed: ENDOSCOPIC RETROGRADE CHOLANGIOPANCREATOGRAPHY (ERCP) SPHINCTEROTOMY REMOVAL OF STONES  Patient Location: PACU  Anesthesia Type:General  Level of Consciousness: alert , oriented, and patient cooperative  Airway & Oxygen Therapy: Patient Spontanous Breathing and Patient connected to face mask oxygen  Post-op Assessment: Report given to RN and Post -op Vital signs reviewed and stable  Post vital signs: Reviewed  Last Vitals:  Vitals Value Taken Time  BP 135/72 02/16/23 1230  Temp 36.5 C 02/16/23 1225  Pulse 100 02/16/23 1231  Resp 18 02/16/23 1231  SpO2 98 % 02/16/23 1231  Vitals shown include unvalidated device data.  Last Pain:  Vitals:   02/16/23 1230  TempSrc:   PainSc: 0-No pain      Patients Stated Pain Goal: 2 (02/16/23 7408)  Complications: No notable events documented.

## 2023-02-17 DIAGNOSIS — Z9049 Acquired absence of other specified parts of digestive tract: Secondary | ICD-10-CM | POA: Diagnosis not present

## 2023-02-17 DIAGNOSIS — K7689 Other specified diseases of liver: Secondary | ICD-10-CM | POA: Diagnosis not present

## 2023-02-17 DIAGNOSIS — K8051 Calculus of bile duct without cholangitis or cholecystitis with obstruction: Secondary | ICD-10-CM | POA: Diagnosis not present

## 2023-02-17 DIAGNOSIS — K838 Other specified diseases of biliary tract: Secondary | ICD-10-CM | POA: Diagnosis not present

## 2023-02-17 LAB — CBC WITH DIFFERENTIAL/PLATELET
Abs Immature Granulocytes: 0.01 10*3/uL (ref 0.00–0.07)
Basophils Absolute: 0 10*3/uL (ref 0.0–0.1)
Basophils Relative: 0 %
Eosinophils Absolute: 0 10*3/uL (ref 0.0–0.5)
Eosinophils Relative: 0 %
HCT: 34.5 % — ABNORMAL LOW (ref 36.0–46.0)
Hemoglobin: 11.4 g/dL — ABNORMAL LOW (ref 12.0–15.0)
Immature Granulocytes: 0 %
Lymphocytes Relative: 15 %
Lymphs Abs: 1 10*3/uL (ref 0.7–4.0)
MCH: 30.6 pg (ref 26.0–34.0)
MCHC: 33 g/dL (ref 30.0–36.0)
MCV: 92.7 fL (ref 80.0–100.0)
Monocytes Absolute: 0.3 10*3/uL (ref 0.1–1.0)
Monocytes Relative: 4 %
Neutro Abs: 5.3 10*3/uL (ref 1.7–7.7)
Neutrophils Relative %: 81 %
Platelets: 250 10*3/uL (ref 150–400)
RBC: 3.72 MIL/uL — ABNORMAL LOW (ref 3.87–5.11)
RDW: 13.1 % (ref 11.5–15.5)
WBC: 6.5 10*3/uL (ref 4.0–10.5)
nRBC: 0 % (ref 0.0–0.2)

## 2023-02-17 LAB — HEPATIC FUNCTION PANEL
ALT: 173 U/L — ABNORMAL HIGH (ref 0–44)
AST: 86 U/L — ABNORMAL HIGH (ref 15–41)
Albumin: 3.2 g/dL — ABNORMAL LOW (ref 3.5–5.0)
Alkaline Phosphatase: 297 U/L — ABNORMAL HIGH (ref 38–126)
Bilirubin, Direct: 0.9 mg/dL — ABNORMAL HIGH (ref 0.0–0.2)
Indirect Bilirubin: 1.7 mg/dL — ABNORMAL HIGH (ref 0.3–0.9)
Total Bilirubin: 2.6 mg/dL — ABNORMAL HIGH (ref 0.3–1.2)
Total Protein: 5.5 g/dL — ABNORMAL LOW (ref 6.5–8.1)

## 2023-02-17 LAB — BASIC METABOLIC PANEL
Anion gap: 5 (ref 5–15)
BUN: 7 mg/dL — ABNORMAL LOW (ref 8–23)
CO2: 24 mmol/L (ref 22–32)
Calcium: 9 mg/dL (ref 8.9–10.3)
Chloride: 108 mmol/L (ref 98–111)
Creatinine, Ser: 0.57 mg/dL (ref 0.44–1.00)
GFR, Estimated: >60 mL/min (ref >60)
Glucose, Bld: 100 mg/dL — ABNORMAL HIGH (ref 70–99)
Potassium: 3.9 mmol/L (ref 3.5–5.1)
Sodium: 137 mmol/L (ref 135–145)

## 2023-02-17 LAB — LIPASE, BLOOD: Lipase: 19 U/L (ref 11–51)

## 2023-02-17 NOTE — Progress Notes (Signed)
Assessment unchanged. Pt verbalized understanding of dc instructions through teach back. Understands follow up care. Discharged to front entrance with Husband and NT.

## 2023-02-17 NOTE — Discharge Summary (Signed)
Physician Discharge Summary  [name] ZOX:[id number] DOB: 1951/09/04 DOA: 02/15/2023  PCP: Deeann Saint, MD  Admit date: 02/15/2023 Discharge date: 02/17/2023 30 Day Unplanned Readmission Risk Score    Flowsheet Row ED to Hosp-Admission (Current) from 02/15/2023 in [facility] [place] [place] [facility]  30 Day Unplanned Readmission Risk Score (%) 7.64 Filed at 02/17/2023 0801       This score is the patient's risk of an unplanned readmission within 30 days of being discharged (0 -100%). The score is based on dignosis, age, lab data, medications, orders, and past utilization.   Low:  0-14.9   Medium: 15-21.9   High: 22-29.9   Extreme: 30 and above          Admitted From: Home Disposition: Home  Recommendations for Outpatient Follow-up:  Follow up with PCP in 1-2 weeks Please obtain BMP/CBC in one week Please follow up with your PCP on the following pending results: Unresulted Labs (From admission, onward)     Start     Ordered   02/22/23 0500  Creatinine, serum  (enoxaparin (LOVENOX)    CrCl >/= 30 ml/min)  Weekly,   R     Comments: while on enoxaparin therapy    02/15/23 2136              Home Health: None Equipment/Devices: None  Discharge Condition: Stable CODE STATUS: Full code Diet recommendation: Cardiac  Subjective: Seen and examined.  She has no complaints.  Feels very well and excited to go home.  Brief/Interim Summary: [name] is a 72 y.o. female with medical history significant of diverticulosis, history of laparoscopic cholecystectomy and ERCP who has been having significant episode of abdominal pain since November.  She was following up with [facility] and has had plan for MRCP down the road.  Pain has persisted.  Patient actually has had 5 attacks of the pain which is sharp rated as 10 out of 10 in the right upper quadrant.  Workup showed elevated LFTs.  Discussed with GI with recommendation for admission with plan for ERCP or MRCP.  Patient  eventually underwent ERCP by Dr. Russella Dar on 02/16/2023 and was found to have choledocholithiasis, CBD sludge, underwent sphincterotomy extension and balloon catheter CBD clearing.  LFTs improving.  Symptoms have resolved.  She is cleared from GI for discharge and she will follow-up with her primary GI.   Acute colitis:  patient's CT abdomen pelvis reading per radiology shows significant transverse and descending colitis.  She was not having abdominal pain however I started her on Zosyn yesterday.  I saw this patient along with Dr. Russella Dar and discussed in length with Dr. Russella Dar, Dr. Russella Dar does not think that she has acute colitis and he believes that it is likely over reading by radiology and does not recommend any antibiotics at discharge.    Hypokalemia: Resolved.  Discharge plan was discussed with patient and/or family member and they verbalized understanding and agreed with it.  Discharge Diagnoses:  Principal Problem:   Pneumobilia Active Problems:   RUQ pain   Acute colitis   Epigastric pain   Elevated LFTs    Discharge Instructions   Allergies as of 02/17/2023       Reactions   Aleve [naproxen] Nausea Only, Other (See Comments)   Is to avoid this due to stomach issues   Cheese Rash, Other (See Comments)   "Yellow cheese"   Sulfamethoxazole Hives, Other (See Comments)   Tomato Rash  Medication List     TAKE these medications    acyclovir 200 MG capsule Commonly known as: ZOVIRAX TAKE 1 CAPSULE BY MOUTH EVERY DAY What changed:  how much to take when to take this   CALCIUM CITRATE + D3 PO Take 1 capsule by mouth daily.   COLLAGEN PO Take 2 Scoops by mouth See admin instructions. Mix 2 scoopsful of powder into a cup of coffee and drink once a day   escitalopram 20 MG tablet Commonly known as: LEXAPRO TAKE 1 TABLET BY MOUTH EVERY DAY IN THE MORNING What changed:  how much to take how to take this when to take this additional instructions   multivitamin  capsule Take 1 capsule by mouth daily with breakfast.   omeprazole 20 MG capsule Commonly known as: PRILOSEC TAKE 1 CAPSULE BY MOUTH EVERY DAY What changed:  how much to take when to take this   traMADol 50 MG tablet Commonly known as: ULTRAM Take 1 tablet (50 mg total) by mouth 2 (two) times daily. What changed:  when to take this reasons to take this   TURMERIC PO Take 2 tablets by mouth See admin instructions. Chew 2 gummies by mouth once daily        Follow-up Information     Deeann Saint, MD Follow up in 1 week(s).   Specialty: Family Medicine Contact information: [address] [place] [address] [phone]         Napoleon Form, MD Follow up in 1 month(s).   Specialty: Gastroenterology Contact information: [address] [place] [address] [phone]                Allergies  Allergen Reactions   Aleve [Naproxen] Nausea Only and Other (See Comments)    Is to avoid this due to stomach issues   Cheese Rash and Other (See Comments)    "Yellow cheese"   Sulfamethoxazole Hives and Other (See Comments)   Tomato Rash    Consultations: GI   Procedures/Studies: CT ABDOMEN PELVIS W CONTRAST  Result Date: 02/15/2023 CLINICAL DATA:  Abdominal pain, acute, nonlocalized EXAM: CT ABDOMEN AND PELVIS WITH CONTRAST TECHNIQUE: Multidetector CT imaging of the abdomen and pelvis was performed using the standard protocol following bolus administration of intravenous contrast. RADIATION DOSE REDUCTION: This exam was performed according to the departmental dose-optimization program which includes automated exposure control, adjustment of the mA and/or kV according to patient size and/or use of iterative reconstruction technique. CONTRAST:  OMNIPAQUE IOHEXOL 300 MG/ML  SOLN COMPARISON:  CT 10/09/2022, MRI 11/24/2022 FINDINGS: Lower chest: Bibasilar scarring or atelectasis. Heart size is normal. Small hiatal hernia. Hepatobiliary:  Large subcapsular cyst at the right hepatic dome measures approximately 12.4 x 7.0 x 10.0 cm, unchanged from prior. Additional smaller cyst within the left hepatic lobe. No new focal liver lesion. Intra and extrahepatic biliary dilatation with pneumobilia. Common bile duct is dilated up to 1.4 cm, previously 1.1 cm. No obstructive etiology is identified. Pancreas: Unremarkable. No pancreatic ductal dilatation or surrounding inflammatory changes. Spleen: Normal in size without focal abnormality. Adrenals/Urinary Tract: Unremarkable adrenal glands. Kidneys enhance symmetrically without focal lesion, stone, or hydronephrosis. Ureters are nondilated. Urinary bladder appears unremarkable for the degree of distention. Stomach/Bowel: Small hiatal hernia. Stomach otherwise within normal limits. No abnormally dilated loops of bowel. Normal appendix in the right lower quadrant. Scattered sigmoid diverticulosis. Long segment of mild wall thickening of the left colon involving the distal transverse colon, descending, and sigmoid  colon. No pericolonic fat stranding. Vascular/Lymphatic: No significant vascular findings are present. No enlarged abdominal or pelvic lymph nodes. Reproductive: Uterus and bilateral adnexa are unremarkable. Other: No free fluid. No abdominopelvic fluid collection. No pneumoperitoneum. No abdominal wall hernia. Musculoskeletal: No acute or significant osseous findings. Prior L4-5 posterior and interbody fusion. IMPRESSION: 1. Long segment of mild wall thickening of the left colon involving the distal transverse colon, descending, and sigmoid colon, suggestive of a nonspecific colitis. 2. Intra- and extra-hepatic biliary dilatation with pneumobilia. Common bile duct is dilated up to 1.4 cm, previously 1.1 cm. No obstructive etiology is identified. Correlate with LFTs and consider follow-up nonemergent ERCP as clinically indicated. 3. Stable large subcapsular cyst at the right hepatic dome measuring  approximately 12 cm. This lesion may be symptomatic due to local mass effect. 4. Small hiatal hernia. Electronically Signed   By: Duanne Guess D.O.   On: 02/15/2023 17:55   US Abdomen Limited RUQ (LIVER/GB)  Result Date: 02/08/2023 CLINICAL DATA:  Right upper quadrant pain for 5 months. EXAM: ULTRASOUND ABDOMEN LIMITED RIGHT UPPER QUADRANT COMPARISON:  MR abdomen dated 11/24/2022 and abdominal ultrasound dated 06/29/2011 FINDINGS: Gallbladder: Surgically absent. Common bile duct: Diameter: 9 mm, similar to prior exam. Liver: A hepatic cyst in the left hepatic lobe measures 1.8 x 1.0 x 1.7 cm. A large hepatic cyst in the dome of the liver measures 10.4 x 6.5 x 10.0 cm. These appear similar in size to prior exams dating back to 06/29/2011. Increased parenchymal echogenicity. Portal vein is patent on color Doppler imaging with normal direction of blood flow towards the liver. Other: None. IMPRESSION: 1. Hepatic steatosis. 2. Common bile duct measuring 9 mm. 3. Two benign-appearing hepatic cysts, similar to prior exams dating back to 06/29/2011. Electronically Signed   By: Romona Curls M.D.   On: 02/08/2023 12:15     Discharge Exam: Vitals:   02/16/23 2059 02/17/23 0628  BP: 130/65 126/77  Pulse: 64 63  Resp: 16 18  Temp: 98.3 F (36.8 C) 98.6 F (37 C)  SpO2: 96% 98%   Vitals:   02/16/23 1407 02/16/23 1730 02/16/23 2059 02/17/23 0628  BP: 112/75 119/64 130/65 126/77  Pulse: 70 64 64 63  Resp: 14 14 16 18   Temp: 98.2 F (36.8 C) 98.5 F (36.9 C) 98.3 F (36.8 C) 98.6 F (37 C)  TempSrc: Oral Oral Oral Oral  SpO2: 98% 97% 96% 98%  Weight:      Height:        General: Pt is alert, awake, not in acute distress Cardiovascular: RRR, S1/S2 +, no rubs, no gallops Respiratory: CTA bilaterally, no wheezing, no rhonchi Abdominal: Soft, NT, ND, bowel sounds + Extremities: no edema, no cyanosis    The results of significant diagnostics from this hospitalization (including imaging,  microbiology, ancillary and laboratory) are listed below for reference.     Microbiology: No results found for this or any previous visit (from the past 240 hour(s)).   Labs: BNP (last 3 results) No results for input(s): "BNP" in the last 8760 hours. Basic Metabolic Panel: Recent Labs  Lab 02/15/23 1511 02/15/23 2152 02/16/23 0529 02/17/23 0420  NA 138  --  140 137  K 3.5  --  3.2* 3.9  CL 102  --  108 108  CO2 25  --  26 24  GLUCOSE 150*  --  120* 100*  BUN 12  --  8 7*  CREATININE 0.59 0.59 0.53 0.57  CALCIUM 9.6  --  8.6* 9.0   Liver Function Tests: Recent Labs  Lab 02/15/23 1511 02/16/23 0529 02/17/23 0420  AST 258* 158* 86*  ALT 353* 231* 173*  ALKPHOS 420* 305* 297*  BILITOT 4.1* 5.1* 2.6*  PROT 7.4 5.5* 5.5*  ALBUMIN 4.4 3.2* 3.2*   Recent Labs  Lab 02/15/23 1511 02/17/23 0420  LIPASE 30 19   No results for input(s): "AMMONIA" in the last 168 hours. CBC: Recent Labs  Lab 02/15/23 1511 02/15/23 2152 02/16/23 0529 02/17/23 0420  WBC 12.6* 13.1* 7.0 6.5  NEUTROABS 11.5*  --   --  5.3  HGB 14.9 13.8 11.7* 11.4*  HCT 44.7 41.6 35.3* 34.5*  MCV 90.9 92.2 91.9 92.7  PLT 310 336 256 250   Cardiac Enzymes: No results for input(s): "CKTOTAL", "CKMB", "CKMBINDEX", "TROPONINI" in the last 168 hours. BNP: Invalid input(s): "POCBNP" CBG: No results for input(s): "GLUCAP" in the last 168 hours. D-Dimer No results for input(s): "DDIMER" in the last 72 hours. Hgb A1c No results for input(s): "HGBA1C" in the last 72 hours. Lipid Profile No results for input(s): "CHOL", "HDL", "LDLCALC", "TRIG", "CHOLHDL", "LDLDIRECT" in the last 72 hours. Thyroid function studies No results for input(s): "TSH", "T4TOTAL", "T3FREE", "THYROIDAB" in the last 72 hours.  Invalid input(s): "FREET3" Anemia work up No results for input(s): "VITAMINB12", "FOLATE", "FERRITIN", "TIBC", "IRON", "RETICCTPCT" in the last 72 hours. Urinalysis    Component Value Date/Time    COLORURINE yellow 07/21/2008 1021   APPEARANCEUR Clear 07/21/2008 1021   LABSPEC 1.020 07/21/2008 1021   PHURINE 7.0 07/21/2008 1021   HGBUR negative 07/21/2008 1021   BILIRUBINUR negative 07/21/2008 1021   UROBILINOGEN 0.2 07/21/2008 1021   NITRITE negative 07/21/2008 1021   Sepsis Labs Recent Labs  Lab 02/15/23 1511 02/15/23 2152 02/16/23 0529 02/17/23 0420  WBC 12.6* 13.1* 7.0 6.5   Microbiology No results found for this or any previous visit (from the past 240 hour(s)).   Time coordinating discharge: Over 30 minutes  SIGNED:   Hughie Clossavi Amandeep Nesmith, MD  [facility] 02/17/2023, 9:07 AM *Please note that this is a verbal dictation therefore any spelling or grammatical errors are due to the "[employer]agon [employer] One" system interpretation. If 7PM-7AM, please contact night-coverage [email or web address]

## 2023-02-17 NOTE — Plan of Care (Signed)

## 2023-02-17 NOTE — Progress Notes (Addendum)
Progress Note   Assessment    Choledocholithiasis, CBD sludge S/P ERCP, sphincterotomy extension and balloon catheter CBD clearing yesterday.  LFTs improved.  Symptoms resolved. S/P cholecystectomy and ERCP with sphincterotomy in 2012 Abnormal CT of the colon with mild wall thickening in the distal transverse, descending and sigmoid colon without associated symptoms.  No further evaluation at this time. Colonoscopy performed in 2020.   Large hepatic cyst, stable on imaging   Recommendations   Repeat LFTs in 4 weeks as outpatient No further evaluation of the abnormal colon wall thickening without associated symptoms at this time.  Defer further evaluation to Dr. Lavon Paganini as outpatient. Follow-up with Dr. Freida Busman as planned for further evaluation of her large hepatic cyst Follow-up office visit with Dr. Lavon Paganini in 1-2 months Advance to soft diet as tolerated OK for discharge from GI standpoint.  GI signing off.   Chief Complaint   Feels well. No abd pain, nausea, vomiting  Vital signs in last 24 hours: Temp:  [97.5 F (36.4 C)-98.6 F (37 C)] 98.6 F (37 C) (04/07 0628) Pulse Rate:  [63-108] 63 (04/07 0628) Resp:  [14-20] 18 (04/07 0628) BP: (112-160)/(53-99) 126/77 (04/07 0628) SpO2:  [96 %-100 %] 98 % (04/07 0628) Weight:  [25 kg] 64 kg (04/06 1105) Last BM Date : 02/15/23  General: Alert, well-developed, in NAD Heart:  Regular rate and rhythm; no murmurs Chest: Clear to ascultation bilaterally Abdomen:  Soft, nontender and nondistended. Normal bowel sounds, without guarding, and without rebound.   Extremities:  Without edema. Neurologic:  Alert and  oriented x4; grossly normal neurologically. Psych:  Alert and cooperative. Normal mood and affect.  Intake/Output from previous day: 04/06 0701 - 04/07 0700 In: 2468.3 [P.O.:480; I.V.:1873.7; IV Piggyback:114.6] Out: 276 [Emesis/NG output:275; Blood:1] Intake/Output this shift: No intake/output data recorded.  Lab  Results: Recent Labs    02/15/23 2152 02/16/23 0529 02/17/23 0420  WBC 13.1* 7.0 6.5  HGB 13.8 11.7* 11.4*  HCT 41.6 35.3* 34.5*  PLT 336 256 250   BMET Recent Labs    02/15/23 1511 02/15/23 2152 02/16/23 0529 02/17/23 0420  NA 138  --  140 137  K 3.5  --  3.2* 3.9  CL 102  --  108 108  CO2 25  --  26 24  GLUCOSE 150*  --  120* 100*  BUN 12  --  8 7*  CREATININE 0.59 0.59 0.53 0.57  CALCIUM 9.6  --  8.6* 9.0   LFT Recent Labs    02/17/23 0420  PROT 5.5*  ALBUMIN 3.2*  AST 86*  ALT 173*  ALKPHOS 297*  BILITOT 2.6*  BILIDIR 0.9*  IBILI 1.7*    Studies/Results: CT ABDOMEN PELVIS W CONTRAST  Result Date: 02/15/2023 CLINICAL DATA:  Abdominal pain, acute, nonlocalized EXAM: CT ABDOMEN AND PELVIS WITH CONTRAST TECHNIQUE: Multidetector CT imaging of the abdomen and pelvis was performed using the standard protocol following bolus administration of intravenous contrast. RADIATION DOSE REDUCTION: This exam was performed according to the departmental dose-optimization program which includes automated exposure control, adjustment of the mA and/or kV according to patient size and/or use of iterative reconstruction technique. CONTRAST:  OMNIPAQUE IOHEXOL 300 MG/ML  SOLN COMPARISON:  CT 10/09/2022, MRI 11/24/2022 FINDINGS: Lower chest: Bibasilar scarring or atelectasis. Heart size is normal. Small hiatal hernia. Hepatobiliary: Large subcapsular cyst at the right hepatic dome measures approximately 12.4 x 7.0 x 10.0 cm, unchanged from prior. Additional smaller cyst within the left hepatic lobe. No new  focal liver lesion. Intra and extrahepatic biliary dilatation with pneumobilia. Common bile duct is dilated up to 1.4 cm, previously 1.1 cm. No obstructive etiology is identified. Pancreas: Unremarkable. No pancreatic ductal dilatation or surrounding inflammatory changes. Spleen: Normal in size without focal abnormality. Adrenals/Urinary Tract: Unremarkable adrenal glands. Kidneys  enhance symmetrically without focal lesion, stone, or hydronephrosis. Ureters are nondilated. Urinary bladder appears unremarkable for the degree of distention. Stomach/Bowel: Small hiatal hernia. Stomach otherwise within normal limits. No abnormally dilated loops of bowel. Normal appendix in the right lower quadrant. Scattered sigmoid diverticulosis. Long segment of mild wall thickening of the left colon involving the distal transverse colon, descending, and sigmoid colon. No pericolonic fat stranding. Vascular/Lymphatic: No significant vascular findings are present. No enlarged abdominal or pelvic lymph nodes. Reproductive: Uterus and bilateral adnexa are unremarkable. Other: No free fluid. No abdominopelvic fluid collection. No pneumoperitoneum. No abdominal wall hernia. Musculoskeletal: No acute or significant osseous findings. Prior L4-5 posterior and interbody fusion. IMPRESSION: 1. Long segment of mild wall thickening of the left colon involving the distal transverse colon, descending, and sigmoid colon, suggestive of a nonspecific colitis. 2. Intra- and extra-hepatic biliary dilatation with pneumobilia. Common bile duct is dilated up to 1.4 cm, previously 1.1 cm. No obstructive etiology is identified. Correlate with LFTs and consider follow-up nonemergent ERCP as clinically indicated. 3. Stable large subcapsular cyst at the right hepatic dome measuring approximately 12 cm. This lesion may be symptomatic due to local mass effect. 4. Small hiatal hernia. Electronically Signed   By: Duanne Guess D.O.   On: 02/15/2023 17:55      LOS: 2 days   Judie Petit T. Russella Dar, MD 02/17/2023, 8:47 AM See Loretha Stapler, [facility], to contact our on call provider

## 2023-02-17 NOTE — Progress Notes (Signed)
PHARMACY NOTE -  zosyn  Pharmacy has been assisting with dosing of zosyn for colitis. Dosage remains stable at 3.375 gm IV q8h (infuse over 4 hrs) and need for further dosage adjustment appears unlikely at present.    Will sign off at this time.  Please reconsult if a change in clinical status warrants re-evaluation of dosage.  Dorna Leitz, PharmD, BCPS 02/17/2023 8:24 AM

## 2023-02-18 ENCOUNTER — Telehealth: Payer: Self-pay

## 2023-02-18 ENCOUNTER — Telehealth: Payer: Self-pay | Admitting: Gastroenterology

## 2023-02-18 NOTE — Transitions of Care (Post Inpatient/ED Visit) (Signed)
   02/18/2023  Name: [name] MRN: [id number] DOB: 05/23/1951  Today's TOC FU Call Status: Today's TOC FU Call Status:: Successful TOC FU Call Competed TOC FU Call Complete Date: 02/18/23  Transition Care Management Follow-up Telephone Call Date of Discharge: 02/17/23 Discharge Facility: [facility]) Type of Discharge: Inpatient Admission Primary Inpatient Discharge Diagnosis:: RUQ pain, pneumobilia How have you been since you were released from the hospital?: Better (I am feeling much better.  I am starting to eat today) Any questions or concerns?: No  Items Reviewed: Did you receive and understand the discharge instructions provided?: Yes Medications obtained and verified?: Yes (Medications Reviewed) Any new allergies since your discharge?: No Dietary orders reviewed?: Yes Type of Diet Ordered:: cardiac advance slowly Do you have support at home?: Yes People in Home: spouse Name of Support/Comfort Primary Source: husband [name]  Home Care and Equipment/Supplies: Were Home Health Services Ordered?: NA Any new equipment or medical supplies ordered?: NA  Functional Questionnaire: Do you need assistance with bathing/showering or dressing?: No Do you need assistance with meal preparation?: No Do you need assistance with eating?: No Do you have difficulty maintaining continence: No Do you need assistance with getting out of bed/getting out of a chair/moving?: No Do you have difficulty managing or taking your medications?: No  Follow up appointments reviewed: PCP Follow-up appointment confirmed?: Yes Date of PCP follow-up appointment?: 02/25/23 Follow-up Provider: Dr. Salomon Fick Specialist [facility] Follow-up appointment confirmed?: Yes Date of Specialist follow-up appointment?: 03/13/23 Follow-Up Specialty Provider:: Dr. Lavon Paganini Do you need transportation to your follow-up appointment?: No Do you understand care options if your condition(s) worsen?: Yes-patient  verbalized understanding  SDOH Interventions Today    Flowsheet Row Most Recent Value  SDOH Interventions   Food Insecurity Interventions Intervention Not Indicated  Transportation Interventions Intervention Not Indicated      Interventions Today    Flowsheet Row Most Recent Value  General Interventions   General Interventions Discussed/Reviewed General Interventions Discussed  Education Interventions   Education Provided Provided Education  Provided Verbal Education On Nutrition  Nutrition Interventions   Nutrition Discussed/Reviewed Nutrition Discussed, Increaing proteins  [Reviewed to progress diet slowly and to include foods with more calories and protein]       TOC Interventions Today    Flowsheet Row Most Recent Value  TOC Interventions   TOC Interventions Discussed/Reviewed TOC Interventions Discussed, Post discharge activity limitations per provider, S/S of infection        SIGNATURE Dudley Major RN, BSN,CCM, CDE Care Management Coordinator [facility] Care Management [phone]

## 2023-02-18 NOTE — Telephone Encounter (Signed)
Spoke with the patient. Scheduled her for 03/13/23 at 11:20. Patient reports she is feeling well.

## 2023-02-18 NOTE — Telephone Encounter (Signed)
Patient had a procedure a the hospital with Dr.Stark and she was told to follow up with Dr.Nandigam in 3 weeks but my first available is on september , she said she needs to see her sooner .Please advise

## 2023-02-18 NOTE — Telephone Encounter (Signed)
Please schedule follow-up office visit with me in 4 to 6 weeks.  Thank you

## 2023-02-19 ENCOUNTER — Encounter (HOSPITAL_COMMUNITY): Payer: Self-pay | Admitting: Gastroenterology

## 2023-02-19 DIAGNOSIS — K8050 Calculus of bile duct without cholangitis or cholecystitis without obstruction: Secondary | ICD-10-CM | POA: Diagnosis not present

## 2023-02-19 DIAGNOSIS — K7689 Other specified diseases of liver: Secondary | ICD-10-CM | POA: Diagnosis not present

## 2023-02-19 NOTE — Telephone Encounter (Signed)
Spoke with the patient. She tells me she is "feeling great." She hopes she "feels this way until I am 89." Expresses gratitude for her care.

## 2023-02-25 ENCOUNTER — Ambulatory Visit (INDEPENDENT_AMBULATORY_CARE_PROVIDER_SITE_OTHER): Payer: Medicare Other | Admitting: Family Medicine

## 2023-02-25 ENCOUNTER — Encounter: Payer: Self-pay | Admitting: Family Medicine

## 2023-02-25 VITALS — BP 110/70 | HR 82 | Temp 97.9°F | Ht 65.0 in | Wt 139.4 lb

## 2023-02-25 DIAGNOSIS — K838 Other specified diseases of biliary tract: Secondary | ICD-10-CM | POA: Diagnosis not present

## 2023-02-25 DIAGNOSIS — Z09 Encounter for follow-up examination after completed treatment for conditions other than malignant neoplasm: Secondary | ICD-10-CM

## 2023-02-25 DIAGNOSIS — R7989 Other specified abnormal findings of blood chemistry: Secondary | ICD-10-CM

## 2023-02-25 DIAGNOSIS — R1013 Epigastric pain: Secondary | ICD-10-CM

## 2023-02-25 LAB — CBC WITH DIFFERENTIAL/PLATELET
Basophils Absolute: 0 10*3/uL (ref 0.0–0.1)
Basophils Relative: 0.8 % (ref 0.0–3.0)
Eosinophils Absolute: 0.3 10*3/uL (ref 0.0–0.7)
Eosinophils Relative: 4.7 % (ref 0.0–5.0)
HCT: 40.7 % (ref 36.0–46.0)
Hemoglobin: 13.9 g/dL (ref 12.0–15.0)
Lymphocytes Relative: 36.8 % (ref 12.0–46.0)
Lymphs Abs: 2.3 10*3/uL (ref 0.7–4.0)
MCHC: 34.2 g/dL (ref 30.0–36.0)
MCV: 89.9 fl (ref 78.0–100.0)
Monocytes Absolute: 0.6 10*3/uL (ref 0.1–1.0)
Monocytes Relative: 9.2 % (ref 3.0–12.0)
Neutro Abs: 3 10*3/uL (ref 1.4–7.7)
Neutrophils Relative %: 48.5 % (ref 43.0–77.0)
Platelets: 405 10*3/uL — ABNORMAL HIGH (ref 150.0–400.0)
RBC: 4.53 Mil/uL (ref 3.87–5.11)
RDW: 14 % (ref 11.5–15.5)
WBC: 6.2 10*3/uL (ref 4.0–10.5)

## 2023-02-25 LAB — COMPREHENSIVE METABOLIC PANEL
ALT: 32 U/L (ref 0–35)
AST: 25 U/L (ref 0–37)
Albumin: 4.5 g/dL (ref 3.5–5.2)
Alkaline Phosphatase: 208 U/L — ABNORMAL HIGH (ref 39–117)
BUN: 16 mg/dL (ref 6–23)
CO2: 30 mEq/L (ref 19–32)
Calcium: 10 mg/dL (ref 8.4–10.5)
Chloride: 102 mEq/L (ref 96–112)
Creatinine, Ser: 0.72 mg/dL (ref 0.40–1.20)
GFR: 84.54 mL/min (ref >60.00)
Glucose, Bld: 86 mg/dL (ref 70–99)
Potassium: 4.3 mEq/L (ref 3.5–5.1)
Sodium: 139 mEq/L (ref 135–145)
Total Bilirubin: 1 mg/dL (ref 0.2–1.2)
Total Protein: 6.8 g/dL (ref 6.0–8.3)

## 2023-02-25 NOTE — Progress Notes (Signed)
Established Patient Office Visit   Subjective  Patient ID: [name], female    DOB: 06/24/1951  Age: 72 y.o. MRN: [id number]  Chief Complaint  Patient presents with   Hospitalization Follow-up    Patient is a 72 year old female seen for HFU.  Patient hospitalized 4/5-02/17/2023 for RUQ.  Patient underwent an ERCP, sphincterotomy and removal of stones performed.  Patient states she is feeling much better since discharge.  Denies RUQ pain, diarrhea, nausea, vomiting.  States appetite is increasing.  Notes history of esophageal stricture.  Having to minutes or pure some vegetables to prevent them from getting stuck.  Was scheduled to have esophageal dilation prior to hospitalization.  Has follow-up appointments with Dr. Lavon Paganini and Dr. Freida Busman.    Patient Active Problem List   Diagnosis Date Noted   Acute colitis 02/16/2023   Epigastric pain 02/16/2023   Elevated LFTs 02/16/2023   Pneumobilia 02/15/2023   S/P lumbar fusion 09/13/2021   RUQ pain 05/01/2010   DIVERTICULOSIS, COLON 07/26/2008   HEADACHE 07/26/2008   DEPRESSION 07/21/2007   Past Surgical History:  Procedure Laterality Date   COLONOSCOPY     ERCP  august 2012   ERCP N/A 02/16/2023   Procedure: ENDOSCOPIC RETROGRADE CHOLANGIOPANCREATOGRAPHY (ERCP);  Surgeon: Meryl Dare, MD;  Location: [facility];  Service: Gastroenterology;  Laterality: N/A;   LAPAROSCOPIC CHOLECYSTECTOMY  08/14/2011   REMOVAL OF STONES  02/16/2023   Procedure: REMOVAL OF STONES;  Surgeon: Meryl Dare, MD;  Location: [facility];  Service: Gastroenterology;;   Dennison Mascot  02/16/2023   Procedure: SPHINCTEROTOMY;  Surgeon: Meryl Dare, MD;  Location: [facility];  Service: Gastroenterology;;   WISDOM TOOTH EXTRACTION     Social History   Tobacco Use   Smoking status: Never   Smokeless tobacco: Never  Vaping Use   Vaping Use: Never used  Substance Use Topics   Alcohol use: No   Drug use: No   Family History  Problem  Relation Age of Onset   Diabetes Mother    Heart disease Mother    Obesity Mother    Dementia Mother    Heart disease Father    Stroke Father    Colon cancer Paternal Aunt    Other Paternal Aunt        esophageal stricture   Esophageal cancer Neg Hx    Rectal cancer Neg Hx    Stomach cancer Neg Hx    Allergies  Allergen Reactions   Aleve [Naproxen] Nausea Only and Other (See Comments)    Is to avoid this due to stomach issues   Cheese Rash and Other (See Comments)    "Yellow cheese"   Sulfamethoxazole Hives and Other (See Comments)   Tomato Rash      ROS Negative unless stated above    Objective:     BP 110/70 (BP Location: Left Arm, Patient Position: Sitting, Cuff Size: Normal)   Pulse 82   Temp 97.9 F (36.6 C) (Oral)   Ht  (1.651 m)   Wt 139 lb 6 oz (63.2 kg)   SpO2 98%   BMI 23.19 kg/m  BP Readings from Last 3 Encounters:  02/25/23 110/70  02/17/23 126/77  02/05/23 118/78   Wt Readings from Last 3 Encounters:  02/25/23 139 lb 6 oz (63.2 kg)  02/16/23 141 lb (64 kg)  02/05/23 141 lb (64 kg)      Physical Exam Constitutional:      General: She is not in acute distress.  Appearance: Normal appearance.  HENT:     Head: Normocephalic and atraumatic.     Nose: Nose normal.     Mouth/Throat:     Mouth: Mucous membranes are moist.  Eyes:     Extraocular Movements: Extraocular movements intact.     Conjunctiva/sclera: Conjunctivae normal.  Cardiovascular:     Rate and Rhythm: Normal rate and regular rhythm.     Heart sounds: Normal heart sounds. No murmur heard.    No gallop.  Pulmonary:     Effort: Pulmonary effort is normal. No respiratory distress.     Breath sounds: Normal breath sounds. No wheezing, rhonchi or rales.  Abdominal:     General: Bowel sounds are normal. There is no distension.     Palpations: Abdomen is soft.     Tenderness: There is no abdominal tenderness. There is no guarding or rebound.  Skin:    General: Skin is  warm and dry.  Neurological:     Mental Status: She is alert and oriented to person, place, and time.      No results found for any visits on 02/25/23.    Assessment & Plan:  Hospital discharge follow-up  Pneumobilia -     CBC with Differential/Platelet -     Comprehensive metabolic panel  Epigastric pain -     CBC with Differential/Platelet  Elevated LFTs -     Comprehensive metabolic panel  Symptoms much improved since ERCP and hospital discharge.  TCM phone call made and reviewed.  Records from hospitalization including imaging, notes, labs reviewed.  Patient has to advance diet as tolerated.  Would avoid/limit greasy foods at this time.  Encouraged to keep follow-up appointments with GI.  Given strict precautions.  Return if symptoms worsen or fail to improve.   Deeann Saint, MD

## 2023-02-27 ENCOUNTER — Encounter: Payer: Medicare Other | Admitting: Gastroenterology

## 2023-03-04 ENCOUNTER — Other Ambulatory Visit: Payer: Self-pay | Admitting: Family Medicine

## 2023-03-07 NOTE — Progress Notes (Signed)
[name]    [id number]    Dec 15, 1950  Primary Care Physician:Banks, Bettey Mare, MD  Referring Physician: Deeann Saint, MD [address] [place],  [place] [address]   Chief complaint:  Abnormal LFT, liver cysts Chief Complaint  Patient presents with   Abdominal Pain    Patient is here for f/u from hospital visit. Patient states she was having epigastric abd pain. Patient reports she has sludge and stones in her bile duct. She is doing much better now. Denies abd pain.   HPI: 72 year old very pleasant female here for follow-up visit for abnormal LFT. She was in the ED on 02-15-23. Per that note, she was experiencing intense RUQ pain. She was diagnosed with sludge and bile duct stones.   I last saw her on 02-05-23. At that time, she complained of abdominal pain. She had 4 episodes in the past 3 weeks. Her pain waxed and waned and was accompanied by nausea and vomiting. She also had darker urine during her episodes.   Today, she reports feeling well overall. She denies any GI concerns. We reviewed her ED visit on 02-15-23 and discussed her diagnoses of  sludges and bile duct stone. She reports that her appetite hasn't returned to normal but denies losing any weight.  she denies diarrhea, constipation, nausea, blood in stool, black stool, vomiting, abdominal pain, bloating, unintentional weight loss, reflux, dysphagia.  GI Hx: CT Abdomen Pelvis w contrast 02-15-23 1. Long segment of mild wall thickening of the left colon involving the distal transverse colon, descending, and sigmoid colon, suggestive of a nonspecific colitis. 2. Intra- and extra-hepatic biliary dilatation with pneumobilia. Common bile duct is dilated up to 1.4 cm, previously 1.1 cm. No obstructive etiology is identified. Correlate with LFTs and consider follow-up nonemergent ERCP as clinically indicated. 3. Stable large subcapsular cyst at the right hepatic dome measuring approximately 12 cm.  This lesion may be symptomatic due to local mass effect. 4. Small hiatal hernia.   US Abdomen limited RUQ 02-08-23 1. Hepatic steatosis. 2. Common bile duct measuring 9 mm. 3. Two benign-appearing hepatic cysts, similar to prior exams dating back to 06/29/2011.  underwent CT abdomen pelvis which showed multiple hepatic status and pneumobilia.  She is s/p cholecystectomy with sphincterotomy     Latest Ref Rng & Units 02/25/2023    1:38 PM 02/17/2023    4:20 AM 02/16/2023    5:29 AM  Hepatic Function  Total Protein 6.0 - 8.3 g/dL 6.8  5.5  5.5   Albumin 3.5 - 5.2 g/dL 4.5  3.2  3.2   AST 0 - 37 U/L 25  86  158   ALT 0 - 35 U/L 32  173  231   Alk Phosphatase 39 - 117 U/L 208  297  305   Total Bilirubin 0.2 - 1.2 mg/dL 1.0  2.6  5.1   Bilirubin, Direct 0.0 - 0.2 mg/dL  0.9     MR Abdomen MRCP W WO Contrast 11-24-22 1. Status post cholecystectomy. Unchanged postoperative biliary ductal dilatation and pneumobilia, presumed secondary to ampullary instrumentation. The central common bile duct measures up to 1.1 cm in caliber. No obstructing calculus or other lesion identified to the ampulla. Consider further interrogation via ERCP if there is continued clinical concern for biliary obstruction. 2. 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 Although generally benign, this may be symptomatic due to large  size and mass effect. Additional smaller, simple benign cyst of the left lobe of the liver. No associated solid component or contrast enhancement. 3. Hepatic steatosis. 4. Small hiatal hernia.  CT abd & pelvis 10/09/22 1. No acute abdominal findings, mass lesions or adenopathy. 2. Moderate sized hiatal hernia. 3. Status post cholecystectomy and sphincterotomy with pneumobilia. No biliary dilatation. 4. Simple appearing hepatic cysts  EGD 11/19/19 - Benign-appearing esophageal stenosis. Dilated. - 5 cm hiatal hernia. - Gastritis.  Biopsied. - Normal examined duodenum.  Colonoscopy 09/02/19 - Diverticulosis in the sigmoid colon. - Non-bleeding internal hemorrhoids. - The examination was otherwise normal.   Current Outpatient Medications:    acyclovir (ZOVIRAX) 200 MG capsule, TAKE 1 CAPSULE BY MOUTH EVERY DAY, Disp: 90 capsule, Rfl: 0   Calcium Citrate-Vitamin D (CALCIUM CITRATE + D3 PO), Take 1 capsule by mouth daily., Disp: , Rfl:    COLLAGEN PO, Take 2 Scoops by mouth See admin instructions. Mix 2 scoopsful of powder into a cup of coffee and drink once a day, Disp: , Rfl:    escitalopram (LEXAPRO) 20 MG tablet, TAKE 1 TABLET BY MOUTH EVERY DAY IN THE MORNING (Patient taking differently: Take 20 mg by mouth daily.), Disp: 90 tablet, Rfl: 3   Multiple Vitamin (MULTIVITAMIN) capsule, Take 1 capsule by mouth daily with breakfast., Disp: , Rfl:    omeprazole (PRILOSEC) 20 MG capsule, TAKE 1 CAPSULE BY MOUTH EVERY DAY (Patient taking differently: Take 20 mg by mouth daily before breakfast.), Disp: 30 capsule, Rfl: 3   TURMERIC PO, Take 2 tablets by mouth See admin instructions. Chew 2 gummies by mouth once daily, Disp: , Rfl:     Allergies as of 03/13/2023 - Review Complete 03/13/2023  Allergen Reaction Noted   Aleve [naproxen] Nausea Only and Other (See Comments) 02/16/2023   Cheese Rash and Other (See Comments) 02/16/2023   Sulfamethoxazole Hives and Other (See Comments) 02/07/2007   Tomato Rash 02/16/2023    Past Medical History:  Diagnosis Date   ABDOMINAL PAIN 05/01/2010   DEPRESSION 07/21/2007   DIVERTICULOSIS, COLON 07/26/2008   Headache(784.0) 07/26/2008   Pityriasis 09/13/2019    Past Surgical History:  Procedure Laterality Date   COLONOSCOPY     ERCP  august 2012   ERCP N/A 02/16/2023   Procedure: ENDOSCOPIC RETROGRADE CHOLANGIOPANCREATOGRAPHY (ERCP);  Surgeon: Meryl Dare, MD;  Location: [facility];  Service: Gastroenterology;  Laterality: N/A;   LAPAROSCOPIC CHOLECYSTECTOMY  08/14/2011    REMOVAL OF STONES  02/16/2023   Procedure: REMOVAL OF STONES;  Surgeon: Meryl Dare, MD;  Location: [facility];  Service: Gastroenterology;;   Dennison Mascot  02/16/2023   Procedure: Dennison Mascot;  Surgeon: Meryl Dare, MD;  Location: [facility];  Service: Gastroenterology;;   WISDOM TOOTH EXTRACTION      Family History  Problem Relation Age of Onset   Diabetes Mother    Heart disease Mother    Obesity Mother    Dementia Mother    Heart disease Father    Stroke Father    Colon cancer Paternal Aunt    Other Paternal Aunt        esophageal stricture   Esophageal cancer Neg Hx    Rectal cancer Neg Hx    Stomach cancer Neg Hx     Social History   Socioeconomic History   Marital status: Married    Spouse name: Not on file   Number of children: Not on file   Years of education: Not on file  Highest education level: Associate degree: occupational, Scientist, product/process development, or vocational program  Occupational History   Not on file  Tobacco Use   Smoking status: Never   Smokeless tobacco: Never  Vaping Use   Vaping Use: Never used  Substance and Sexual Activity   Alcohol use: No   Drug use: No   Sexual activity: Not on file  Other Topics Concern   Not on file  Social History Narrative   Not on file   Social Determinants of Health   Financial Resource Strain: Low Risk  (07/18/2022)   Overall Financial Resource Strain (CARDIA)    Difficulty of Paying Living Expenses: Not hard at all  Food Insecurity: No Food Insecurity (02/18/2023)   Hunger Vital Sign    Worried About Running Out of Food in the Last Year: Never true    Ran Out of Food in the Last Year: Never true  Transportation Needs: No Transportation Needs (02/18/2023)   PRAPARE - Administrator, Civil Service (Medical): No    Lack of Transportation (Non-Medical): No  Physical Activity: Insufficiently Active (07/18/2022)   Exercise Vital Sign    Days of Exercise per Week: 2 days    Minutes of Exercise per  Session: 20 min  Stress: No Stress Concern Present (07/18/2022)   [employer] of Occupational Health - Occupational Stress Questionnaire    Feeling of Stress : Not at all  Social Connections: Socially Integrated (07/18/2022)   Social Connection and Isolation Panel [NHANES]    Frequency of Communication with Friends and Family: More than three times a week    Frequency of Social Gatherings with Friends and Family: More than three times a week    Attends Religious Services: More than 4 times per year    Active Member of [employer] or Organizations: Yes    Attends Engineer, structural: More than 4 times per year    Marital Status: Married  Catering manager Violence: Not At Risk (02/16/2023)   Humiliation, Afraid, Rape, and Kick questionnaire    Fear of Current or Ex-Partner: No    Emotionally Abused: No    Physically Abused: No    Sexually Abused: No      Review of systems: Review of Systems  Constitutional:  Positive for appetite change. Negative for unexpected weight change.  HENT:  Negative for trouble swallowing.   Gastrointestinal:  Negative for abdominal distention, abdominal pain, anal bleeding, blood in stool, constipation, diarrhea, nausea, rectal pain and vomiting.      Physical Exam: General: well-appearing   Eyes: sclera anicteric, no redness ENT: oral mucosa moist without lesions, no cervical or supraclavicular lymphadenopathy CV: RRR, no JVD, no peripheral edema Resp: clear to auscultation bilaterally, normal RR and effort noted GI: soft, epigastric tenderness, RUQ tenderness, active bowel sounds. No guarding or palpable organomegaly noted. Skin; warm and dry, no rash or jaundice noted Neuro: awake, alert and oriented x 3. Normal gross motor function and fluent speech    Data Reviewed:  Reviewed labs, radiology imaging, old records and pertinent past GI work up   Assessment and Plan/Recommendations:  72 year old very pleasant female with elevated  bilirubin and  transaminases.  She is s/p cholecystectomy and sphincterotomy with pneumobilia. 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 Return in 6 months   The patient was provided an opportunity to ask questions and all were answered. The patient agreed with the plan and demonstrated an understanding of the instructions.   I,Safa M Kadhim,acting as a scribe for Marsa Aris, MD.,have documented all relevant documentation on the behalf of Marsa Aris, MD,as directed by  Marsa Aris, MD while in the presence of Marsa Aris, MD.   I, Marsa Aris, MD, have reviewed all documentation for this visit. The documentation on 03/13/23 for the exam, diagnosis, procedures, and orders are all accurate and complete.   Iona Beard , MD    CC: Deeann Saint, MD

## 2023-03-08 DIAGNOSIS — H2513 Age-related nuclear cataract, bilateral: Secondary | ICD-10-CM | POA: Diagnosis not present

## 2023-03-13 ENCOUNTER — Encounter: Payer: Self-pay | Admitting: Gastroenterology

## 2023-03-13 ENCOUNTER — Other Ambulatory Visit (INDEPENDENT_AMBULATORY_CARE_PROVIDER_SITE_OTHER): Payer: Medicare Other

## 2023-03-13 ENCOUNTER — Ambulatory Visit (INDEPENDENT_AMBULATORY_CARE_PROVIDER_SITE_OTHER): Payer: Medicare Other | Admitting: Gastroenterology

## 2023-03-13 VITALS — BP 124/60 | HR 75 | Ht 65.0 in | Wt 141.0 lb

## 2023-03-13 DIAGNOSIS — K7689 Other specified diseases of liver: Secondary | ICD-10-CM | POA: Diagnosis not present

## 2023-03-13 DIAGNOSIS — R1011 Right upper quadrant pain: Secondary | ICD-10-CM | POA: Diagnosis not present

## 2023-03-13 DIAGNOSIS — Z8719 Personal history of other diseases of the digestive system: Secondary | ICD-10-CM

## 2023-03-13 DIAGNOSIS — R7989 Other specified abnormal findings of blood chemistry: Secondary | ICD-10-CM

## 2023-03-13 LAB — CBC WITH DIFFERENTIAL/PLATELET
Basophils Absolute: 0.1 10*3/uL (ref 0.0–0.1)
Basophils Relative: 1 % (ref 0.0–3.0)
Eosinophils Absolute: 0.3 10*3/uL (ref 0.0–0.7)
Eosinophils Relative: 5.3 % — ABNORMAL HIGH (ref 0.0–5.0)
HCT: 40.7 % (ref 36.0–46.0)
Hemoglobin: 14.2 g/dL (ref 12.0–15.0)
Lymphocytes Relative: 34.4 % (ref 12.0–46.0)
Lymphs Abs: 1.9 10*3/uL (ref 0.7–4.0)
MCHC: 34.9 g/dL (ref 30.0–36.0)
MCV: 89.2 fl (ref 78.0–100.0)
Monocytes Absolute: 0.4 10*3/uL (ref 0.1–1.0)
Monocytes Relative: 6.9 % (ref 3.0–12.0)
Neutro Abs: 2.8 10*3/uL (ref 1.4–7.7)
Neutrophils Relative %: 52.4 % (ref 43.0–77.0)
Platelets: 260 10*3/uL (ref 150.0–400.0)
RBC: 4.57 Mil/uL (ref 3.87–5.11)
RDW: 13.8 % (ref 11.5–15.5)
WBC: 5.5 10*3/uL (ref 4.0–10.5)

## 2023-03-13 LAB — COMPREHENSIVE METABOLIC PANEL
ALT: 10 U/L (ref 0–35)
AST: 19 U/L (ref 0–37)
Albumin: 4.4 g/dL (ref 3.5–5.2)
Alkaline Phosphatase: 117 U/L (ref 39–117)
BUN: 14 mg/dL (ref 6–23)
CO2: 29 mEq/L (ref 19–32)
Calcium: 9.5 mg/dL (ref 8.4–10.5)
Chloride: 105 mEq/L (ref 96–112)
Creatinine, Ser: 0.73 mg/dL (ref 0.40–1.20)
GFR: 83.13 mL/min (ref >60.00)
Glucose, Bld: 89 mg/dL (ref 70–99)
Potassium: 3.8 mEq/L (ref 3.5–5.1)
Sodium: 141 mEq/L (ref 135–145)
Total Bilirubin: 1 mg/dL (ref 0.2–1.2)
Total Protein: 6.7 g/dL (ref 6.0–8.3)

## 2023-03-13 NOTE — Patient Instructions (Signed)
Your provider has requested that you go to the basement level for lab work before leaving today. Press "B" on the elevator. The lab is located at the first door on the left as you exit the elevator.   Due to recent changes in healthcare laws, you may see the results of your imaging and laboratory studies on MyChart before your provider has had a chance to review them.  We understand that in some cases there may be results that are confusing or concerning to you. Not all laboratory results come back in the same time frame and the provider may be waiting for multiple results in order to interpret others.  Please give [place] 48 hours in order for your provider to thoroughly review all the results before contacting the office for clarification of your results.   _______________________________________________________  If your blood pressure at your visit was 140/90 or greater, please contact your primary care physician to follow up on this.  _______________________________________________________  If you are age 85 or older, your body mass index should be between 23-30. Your Body mass index is 23.46 kg/m. If this is out of the aforementioned range listed, please consider follow up with your Primary Care Provider.  If you are age 19 or younger, your body mass index should be between 19-25. Your Body mass index is 23.46 kg/m. If this is out of the aformentioned range listed, please consider follow up with your Primary Care Provider.   ________________________________________________________  The [facility] providers would like to encourage you to use [facility] to communicate with providers for non-urgent requests or questions.  Due to long hold times on the telephone, sending your provider a message by [facility] may be a faster and more efficient way to get a response.  Please allow 48 business hours for a response.  Please remember that this is for non-urgent requests.   _______________________________________________________    I appreciate the  opportunity to care for you  Thank You   Marsa Aris , MD

## 2023-03-20 ENCOUNTER — Encounter: Payer: Self-pay | Admitting: Gastroenterology

## 2023-04-01 ENCOUNTER — Other Ambulatory Visit: Payer: Self-pay | Admitting: Family Medicine

## 2023-04-01 DIAGNOSIS — Z8659 Personal history of other mental and behavioral disorders: Secondary | ICD-10-CM

## 2023-04-01 DIAGNOSIS — K219 Gastro-esophageal reflux disease without esophagitis: Secondary | ICD-10-CM

## 2023-05-06 ENCOUNTER — Encounter: Payer: Self-pay | Admitting: Family Medicine

## 2023-05-06 ENCOUNTER — Ambulatory Visit (INDEPENDENT_AMBULATORY_CARE_PROVIDER_SITE_OTHER): Payer: Medicare Other | Admitting: Family Medicine

## 2023-05-06 VITALS — BP 106/78 | HR 95 | Temp 98.7°F | Wt 143.4 lb

## 2023-05-06 DIAGNOSIS — R0782 Intercostal pain: Secondary | ICD-10-CM

## 2023-05-06 DIAGNOSIS — S29011A Strain of muscle and tendon of front wall of thorax, initial encounter: Secondary | ICD-10-CM | POA: Diagnosis not present

## 2023-05-06 DIAGNOSIS — K7689 Other specified diseases of liver: Secondary | ICD-10-CM | POA: Diagnosis not present

## 2023-05-06 NOTE — Progress Notes (Signed)
Established Patient Office Visit   Subjective  Patient ID: [name], female    DOB: 1950-12-17  Age: 72 y.o. MRN: [id number]  Chief Complaint  Patient presents with   Muscle Pain    Possible pulled muscle on right side under breast area, has a cyst in the same area, and wants to make sure it is not the cyst. Not having any issues other than the pain. Pain started about a week ago when she was getting up out of floor.     Patient is a 72 year old female seen for acute concern.  Patient developed pain underneath right breast in a longitudinal distribution 1 week ago after sitting on the floor.  Patient was on the floor leaning forward and pushing up on a piece of furniture with her right arm when the symptoms started.  Patient endorses constant low-level dull 2/10 pain but it turns quickly feels like a knife jabbing.  Patient denies nausea, vomiting, changes in bowels.  Was concerned about the pain because she knows her large 10 cm hepatic cyst is in that same area.  Patient has an appointment with Dr. Freida Busman July 9.  Upon having patient take a deep breath in and out she states discomfort is similar to when ribs are sore from prolonged coughing.  Tried Tylenol for symptoms.    Past Medical History:  Diagnosis Date   ABDOMINAL PAIN 05/01/2010   DEPRESSION 07/21/2007   DIVERTICULOSIS, COLON 07/26/2008   Headache(784.0) 07/26/2008   Pityriasis 09/13/2019   Past Surgical History:  Procedure Laterality Date   COLONOSCOPY     ERCP  august 2012   ERCP N/A 02/16/2023   Procedure: ENDOSCOPIC RETROGRADE CHOLANGIOPANCREATOGRAPHY (ERCP);  Surgeon: Meryl Dare, MD;  Location: [facility];  Service: Gastroenterology;  Laterality: N/A;   LAPAROSCOPIC CHOLECYSTECTOMY  08/14/2011   REMOVAL OF STONES  02/16/2023   Procedure: REMOVAL OF STONES;  Surgeon: Meryl Dare, MD;  Location: [facility];  Service: Gastroenterology;;   Dennison Mascot  02/16/2023   Procedure: SPHINCTEROTOMY;  Surgeon:  Meryl Dare, MD;  Location: [facility];  Service: Gastroenterology;;   WISDOM TOOTH EXTRACTION     Social History   Tobacco Use   Smoking status: Never   Smokeless tobacco: Never  Vaping Use   Vaping Use: Never used  Substance Use Topics   Alcohol use: No   Drug use: No   Family History  Problem Relation Age of Onset   Diabetes Mother    Heart disease Mother    Obesity Mother    Dementia Mother    Heart disease Father    Stroke Father    Colon cancer Paternal Aunt    Other Paternal Aunt        esophageal stricture   Esophageal cancer Neg Hx    Rectal cancer Neg Hx    Stomach cancer Neg Hx    Allergies  Allergen Reactions   Aleve [Naproxen] Nausea Only and Other (See Comments)    Is to avoid this due to stomach issues   Cheese Rash and Other (See Comments)    "Yellow cheese"   Sulfamethoxazole Hives and Other (See Comments)   Tomato Rash      ROS Negative unless stated above    Objective:     BP 106/78 (BP Location: Left Arm, Patient Position: Sitting, Cuff Size: Normal)   Pulse 95   Temp 98.7 F (37.1 C) (Oral)   Wt 143 lb 6.4 oz (65 kg)   SpO2  96%   BMI 23.86 kg/m    Physical Exam Constitutional:      Appearance: Normal appearance.  HENT:     Head: Normocephalic and atraumatic.     Nose: Nose normal.     Mouth/Throat:     Mouth: Mucous membranes are moist.  Cardiovascular:     Rate and Rhythm: Normal rate and regular rhythm.     Heart sounds: Normal heart sounds.  Pulmonary:     Effort: Pulmonary effort is normal.     Breath sounds: Normal breath sounds.  Chest:     Chest wall: Tenderness present.       Comments: TTP of intercostal muscles of lower right rib cage.  Similar discomfort with taking a deep breath in. Abdominal:     General: Bowel sounds are normal.     Palpations: Abdomen is soft.     Tenderness: There is no abdominal tenderness.     No results found for any visits on 05/06/23.    Assessment & Plan:   Intercostal pain  Intercostal muscle strain, initial encounter  Liver cyst  Acute pain in right lower rib cage 2/2 intercostal muscle strain from pushing up against furniture to stand while sitting on floor.  Discussed supportive care including ibuprofen, topical analgesics, heat, stretching, massage.  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  Offered labs to check LFTs, pt declines at this time.  Continue to monitor cyst with 43-month repeat imaging, follow-up with GI and GEN surg.  Given strict precautions.  Return if symptoms worsen or fail to improve.   Deeann Saint, MD

## 2023-05-21 DIAGNOSIS — K7689 Other specified diseases of liver: Secondary | ICD-10-CM | POA: Diagnosis not present

## 2023-05-21 DIAGNOSIS — K8050 Calculus of bile duct without cholangitis or cholecystitis without obstruction: Secondary | ICD-10-CM | POA: Diagnosis not present

## 2023-06-01 ENCOUNTER — Other Ambulatory Visit: Payer: Self-pay | Admitting: Family Medicine

## 2023-06-27 ENCOUNTER — Encounter (INDEPENDENT_AMBULATORY_CARE_PROVIDER_SITE_OTHER): Payer: Self-pay

## 2023-06-28 ENCOUNTER — Other Ambulatory Visit: Payer: Self-pay | Admitting: Oncology

## 2023-06-28 DIAGNOSIS — Z006 Encounter for examination for normal comparison and control in clinical research program: Secondary | ICD-10-CM

## 2023-07-08 ENCOUNTER — Other Ambulatory Visit: Payer: Self-pay

## 2023-07-23 ENCOUNTER — Ambulatory Visit (INDEPENDENT_AMBULATORY_CARE_PROVIDER_SITE_OTHER): Payer: Medicare Other | Admitting: Family Medicine

## 2023-07-23 VITALS — Wt 143.0 lb

## 2023-07-23 DIAGNOSIS — M8580 Other specified disorders of bone density and structure, unspecified site: Secondary | ICD-10-CM | POA: Diagnosis not present

## 2023-07-23 DIAGNOSIS — E2839 Other primary ovarian failure: Secondary | ICD-10-CM

## 2023-07-23 DIAGNOSIS — Z0000 Encounter for general adult medical examination without abnormal findings: Secondary | ICD-10-CM

## 2023-07-23 NOTE — Patient Instructions (Addendum)
I really enjoyed getting to talk with you today! I am available on Tuesdays and Thursdays for virtual visits if you have any questions or concerns, or if I can be of any further assistance.   CHECKLIST FROM ANNUAL WELLNESS VISIT:  -Follow up (please call to schedule if not scheduled after visit):   -yearly for annual wellness visit with primary care office  Here is a list of your preventive care/health maintenance measures and the plan for each if any are due:  PLAN For any measures below that may be due:  -ordered the bone density test - please call the office if you have not been contact to schedule in the next 1-2 weeks -please get the updtated flu and covid vaccines and let us know when you do so that we can keep your record up to date  Health Maintenance  Topic Date Due   INFLUENZA VACCINE  06/13/2023   COVID-19 Vaccine (4 - 2023-24 season) 07/14/2023   Zoster Vaccines- Shingrix (2 of 2) 08/27/2023   MAMMOGRAM  09/07/2023   Medicare Annual Wellness (AWV)  07/22/2024   DTaP/Tdap/Td (2 - Td or Tdap) 02/13/2027   Colonoscopy  09/01/2029   Pneumonia Vaccine 55+ Years old  Completed   DEXA SCAN  Completed   Hepatitis C Screening  Completed   HPV VACCINES  Aged Out    -See a dentist at least yearly  -Get your eyes checked and then per your eye specialist's recommendations  -Other issues addressed today:   -I have included below further information regarding a healthy whole foods based diet, physical activity guidelines for adults, stress management and opportunities for social connections. I hope you find this information useful.   -----------------------------------------------------------------------------------------------------------------------------------------------------------------------------------------------------------------------------------------------------------  NUTRITION: -eat real food: lots of colorful vegetables (half the plate) and fruits -5-7 servings  of vegetables and fruits per day (fresh or steamed is best), exp. 2 servings of vegetables with lunch and dinner and 2 servings of fruit per day. Berries and greens such as kale and collards are great choices.  -consume on a regular basis: whole grains (make sure first ingredient on label contains the word "whole"), fresh fruits, fish, nuts, seeds, healthy oils (such as olive oil, avocado oil, grape seed oil) -may eat small amounts of dairy and lean meat on occasion, but avoid processed meats such as ham, bacon, lunch meat, etc. -drink water -try to avoid fast food and pre-packaged foods, processed meat -most experts advise limiting sodium to < 2300mg  per day, should limit further is any chronic conditions such as high blood pressure, heart disease, diabetes, etc. The American Heart Association advised that < 1500mg  is is ideal -try to avoid foods that contain any ingredients with names you do not recognize  -try to avoid sugar/sweets (except for the natural sugar that occurs in fresh fruit) -try to avoid sweet drinks -try to avoid white rice, white bread, pasta (unless whole grain), white or yellow potatoes  EXERCISE GUIDELINES FOR ADULTS: -if you wish to increase your physical activity, do so gradually and with the approval of your doctor -STOP and seek medical care immediately if you have any chest pain, chest discomfort or trouble breathing when starting or increasing exercise  -move and stretch your body, legs, feet and arms when sitting for long periods -Physical activity guidelines for optimal health in adults: -least 150 minutes per week of aerobic exercise (can talk, but not sing) once approved by your doctor, 20-30 minutes of sustained activity or two 10 minute episodes of  sustained activity every day.  -resistance training at least 2 days per week if approved by your doctor -balance exercises 3+ days per week:   Stand somewhere where you have something sturdy to hold onto if you lose  balance.    1) lift up on toes, start with 5x per day and work up to 20x   2) stand and lift on leg straight out to the side so that foot is a few inches of the floor, start with 5x each side and work up to 20x each side   3) stand on one foot, start with 5 seconds each side and work up to 20 seconds on each side  If you need ideas or help with getting more active:  -Silver sneakers https://tools.silversneakers.com  -Walk with a Doc: http://[email or web address].[email or web address]-[email or web address]  -try to include resistance (weight lifting/strength building) and balance exercises twice per week: or the following link for ideas: http://castillo-powell.[email or web address]/  BuyDucts.dk  STRESS MANAGEMENT: -can try meditating, or just sitting quietly with deep breathing while intentionally relaxing all parts of your body for 5 minutes daily -if you need further help with stress, anxiety or depression please follow up with your primary doctor or contact the wonderful folks at [facility]: [phone]  SOCIAL CONNECTIONS: -options in [place] if you wish to engage in more social and exercise related activities:  -Silver sneakers https://tools.[email or web address]  -Walk with a Doc: http://[email or web address].[email or web address]-[email or web address]  -Check out the [facility] Active Adults 50+ section on the [place] of [employer] (hiking clubs, book clubs, cards and games, chess, exercise classes, aquatic classes and much more) - see the website for details: https://www.[place].gov/departments/parks-recreation/active-adults50  -YouTube has lots of exercise videos for different ages and abilities as well  -[facility] (a variety of indoor and outdoor inperson activities for adults). [phone]. [address]  -Virtual Online Classes (a variety of topics): see seniorplanet.org or call [phone]  -consider volunteering at a school, hospice  center, church, senior center or elsewhere    ADVANCED HEALTHCARE DIRECTIVES:  Everyone should have advanced health care directives in place. This is so that you get the care you want, should you ever be in a situation where you are unable to make your own medical decisions.   From the [place] Advanced Directive Website: "Advance Health Care Directives are legal documents in which you give written instructions about your health care if, in the future, you cannot speak for yourself.   A health care power of attorney allows you to name a person you trust to make your health care decisions if you cannot make them yourself. A declaration of a desire for a natural death (or living will) is document, which states that you desire not to have your life prolonged by extraordinary measures if you have a terminal or incurable illness or if you are in a vegetative state. An advance instruction for mental health treatment makes a declaration of instructions, information and preferences regarding your mental health treatment. It also states that you are aware that the advance instruction authorizes a mental health treatment provider to act according to your wishes. It may also outline your consent or refusal of mental health treatment. A declaration of an anatomical gift allows anyone over the age of 24 to make a gift by will, organ donor card or other document."   Please see the following website or an elder law attorney for forms, FAQs and for completion of advanced directives: [place] TEFL teacher Health Care Directives Advance Health Care Directives (  http://guzman.[email or web address]/)  Or copy and paste the following to your web browser: PoshChat.fi

## 2023-07-23 NOTE — Progress Notes (Signed)
PATIENT CHECK-IN and HEALTH RISK ASSESSMENT QUESTIONNAIRE:  -completed by phone/video for upcoming Medicare Preventive Visit  Pre-Visit Check-in: 1)Vitals (height, wt, BP, etc) - record in vitals section for visit on day of visit Request home vitals (wt, BP, etc.) and enter into vitals, THEN update Vital Signs SmartPhrase below at the top of the HPI. See below.  2)Review and Update Medications, Allergies PMH, Surgeries, Social history in Epic 3)Hospitalizations in the last year with date/reason?  Yes, April 2024 Listed in chart  - choledocholithiasis 4)Review and Update Care Team (patient's specialists) in Epic 5) Complete PHQ9 in Epic  6) Complete Fall Screening in Epic 7)Review all Health Maintenance Due and order under PCP if not done.  8)Medicare Wellness Questionnaire: Answer theses question about your habits: Do you drink alcohol? No  If yes, how many drinks do you have a day?n/a Have you ever smoked? No  Quit date if applicable?  N/a  How many packs a day do/did you smoke?  N/a Do you use smokeless tobacco? No Do you use an illicit drugs? No  Do you exercises?  No - has videos for chair exercises but only does on and off, [place] neighborhood, would prefer to do things at home, has silver fit with bcbs Are you sexually active?  No Number of partners? N/a Typical breakfast: oatmeal, egg. Usually skips bfast  Typical lunch sandwich or chicken and veg  Typical dinner chicken and pasta or chicken and vegetables  Typical snacks: yogurt or icy popcicles  sometimes chocolate or protein bar   Beverages: sweet tea, water in the morning.   Answer theses question about you: Can you perform most household chores? yes Do you find it hard to follow a conversation in a noisy room? Not sure but no  Do you often ask people to speak up or repeat themselves? No  Do you feel that you have a problem with memory? No  Do you balance your checkbook and or bank acounts? Yes  Do you feel  safe at home? Yes  Last dentist visit? over 2 years  Do you need assistance with any of the following: Please note if so no   Driving?  Feeding yourself?  Getting from bed to chair?  Getting to the toilet?  Bathing or showering?  Dressing yourself?  Managing money?  Climbing a flight of stairs  Preparing meals?  Do you have Advanced Directives in place (Living Will, Healthcare Power or Attorney)?  No - but has the papers to do, agrees to bring copy once done   Last eye Exam and location? This past feb    Do you currently use prescribed or non-prescribed narcotic or opioid pain medications? No   Do you have a history or close family history of breast, ovarian, tubal or peritoneal cancer or a family member with BRCA (breast cancer susceptibility 1 and 2) gene mutations? No   States does not heck BP as it stays low. Request home vitals (wt, BP, etc.) and enter into vitals, THEN update Vital Signs SmartPhrase below at the top of the HPI. See below.   Nurse/Assistant Credentials/time stamp: Tora Perches -CMA 3:03 pm   ----------------------------------------------------------------------------------------------------------------------------------------------------------------------------------------------------------------------  Vital Signs: Unable to obtain new vitals due to this being a telehealth visit.   MEDICARE ANNUAL PREVENTIVE VISIT WITH PROVIDER: (Welcome to Medicare, initial annual wellness or annual wellness exam)  Virtual Visit via Video Note  I connected with [name] on 07/23/23 by a video enabled telemedicine application and  verified that I am speaking with the correct person using two identifiers.  Location patient: home Location provider:work or home office Persons participating in the virtual visit: patient, provider  Concerns and/or follow up today: none   See HM section in Epic for other details of completed HM.    ROS: negative for report of fevers,  unintentional weight loss, vision changes, vision loss, hearing loss or change, chest pain, sob, hemoptysis, melena, hematochezia, hematuria, falls, bleeding or bruising, thoughts of suicide or self harm, memory loss  Patient-completed extensive health risk assessment - reviewed and discussed with the patient: See Health Risk Assessment completed with patient prior to the visit either above or in recent phone note. This was reviewed in detailed with the patient today and appropriate recommendations, orders and referrals were placed as needed per Summary below and patient instructions.   Review of Medical History: -PMH, PSH, Family History and current specialty and care providers reviewed and updated and listed below   Patient Care Team: Deeann Saint, MD as PCP - General (Family Medicine)   Past Medical History:  Diagnosis Date   ABDOMINAL PAIN 05/01/2010   DEPRESSION 07/21/2007   DIVERTICULOSIS, COLON 07/26/2008   Headache(784.0) 07/26/2008   Pityriasis 09/13/2019    Past Surgical History:  Procedure Laterality Date   COLONOSCOPY     ERCP  august 2012   ERCP N/A 02/16/2023   Procedure: ENDOSCOPIC RETROGRADE CHOLANGIOPANCREATOGRAPHY (ERCP);  Surgeon: Meryl Dare, MD;  Location: [facility];  Service: Gastroenterology;  Laterality: N/A;   LAPAROSCOPIC CHOLECYSTECTOMY  08/14/2011   REMOVAL OF STONES  02/16/2023   Procedure: REMOVAL OF STONES;  Surgeon: Meryl Dare, MD;  Location: [facility];  Service: Gastroenterology;;   Dennison Mascot  02/16/2023   Procedure: SPHINCTEROTOMY;  Surgeon: Meryl Dare, MD;  Location: [facility];  Service: Gastroenterology;;   WISDOM TOOTH EXTRACTION      Social History   Socioeconomic History   Marital status: Married    Spouse name: Not on file   Number of children: Not on file   Years of education: Not on file   Highest education level: Associate degree: occupational, Scientist, product/process development, or vocational program  Occupational History    Not on file  Tobacco Use   Smoking status: Never   Smokeless tobacco: Never  Vaping Use   Vaping status: Never Used  Substance and Sexual Activity   Alcohol use: No   Drug use: No   Sexual activity: Not on file  Other Topics Concern   Not on file  Social History Narrative   Not on file   Social Determinants of Health   Financial Resource Strain: Low Risk  (07/18/2022)   Overall Financial Resource Strain (CARDIA)    Difficulty of Paying Living Expenses: Not hard at all  Food Insecurity: No Food Insecurity (02/18/2023)   Hunger Vital Sign    Worried About Running Out of Food in the Last Year: Never true    Ran Out of Food in the Last Year: Never true  Transportation Needs: No Transportation Needs (02/18/2023)   PRAPARE - Administrator, Civil Service (Medical): No    Lack of Transportation (Non-Medical): No  Physical Activity: Insufficiently Active (07/18/2022)   Exercise Vital Sign    Days of Exercise per Week: 2 days    Minutes of Exercise per Session: 20 min  Stress: No Stress Concern Present (07/18/2022)   [employer] of Occupational Health - Occupational Stress Questionnaire    Feeling of Stress :  Not at all  Social Connections: Socially Integrated (07/18/2022)   Social Connection and Isolation Panel [NHANES]    Frequency of Communication with Friends and Family: More than three times a week    Frequency of Social Gatherings with Friends and Family: More than three times a week    Attends Religious Services: More than 4 times per year    Active Member of [employer] or Organizations: Yes    Attends Engineer, structural: More than 4 times per year    Marital Status: Married  Catering manager Violence: Not At Risk (02/16/2023)   Humiliation, Afraid, Rape, and Kick questionnaire    Fear of Current or Ex-Partner: No    Emotionally Abused: No    Physically Abused: No    Sexually Abused: No    Family History  Problem Relation Age of Onset   Diabetes Mother     Heart disease Mother    Obesity Mother    Dementia Mother    Heart disease Father    Stroke Father    Colon cancer Paternal Aunt    Other Paternal Aunt        esophageal stricture   Esophageal cancer Neg Hx    Rectal cancer Neg Hx    Stomach cancer Neg Hx     Current Outpatient Medications on File Prior to Visit  Medication Sig Dispense Refill   acyclovir (ZOVIRAX) 200 MG capsule TAKE 1 CAPSULE BY MOUTH EVERY DAY 90 capsule 0   Calcium Citrate-Vitamin D (CALCIUM CITRATE + D3 PO) Take 1 capsule by mouth daily.     COLLAGEN PO Take 2 Scoops by mouth See admin instructions. Mix 2 scoopsful of powder into a cup of coffee and drink once a day     escitalopram (LEXAPRO) 20 MG tablet TAKE 1 TABLET BY MOUTH EVERY DAY IN THE MORNING 90 tablet 1   Multiple Vitamin (MULTIVITAMIN) capsule Take 1 capsule by mouth daily with breakfast.     omeprazole (PRILOSEC) 20 MG capsule TAKE 1 CAPSULE BY MOUTH EVERY DAY 30 capsule 3   TURMERIC PO Take 2 tablets by mouth See admin instructions. Chew 2 gummies by mouth once daily     No current facility-administered medications on file prior to visit.    Allergies  Allergen Reactions   Aleve [Naproxen] Nausea Only and Other (See Comments)    Is to avoid this due to stomach issues   Cheese Rash and Other (See Comments)    "Yellow cheese"   Sulfamethoxazole Hives and Other (See Comments)   Tomato Rash       Physical Exam Vitals requested from patient and listed below if patient had equipment and was able to obtain at home for this virtual visit: There were no vitals filed for this visit. Estimated body mass index is 23.8 kg/m as calculated from the following:   Height as of 03/13/23: 5\' 5"  (1.651 m).   Weight as of this encounter: 143 lb (64.9 kg).  EKG (optional): deferred due to virtual visit  GENERAL: alert, oriented, no acute distress detected, full vision exam deferred due to pandemic and/or virtual encounter  HEENT: atraumatic, conjunttiva  clear, no obvious abnormalities on inspection of external nose and ears  NECK: normal movements of the head and neck  LUNGS: on inspection no signs of respiratory distress, breathing rate appears normal, no obvious gross SOB, gasping or wheezing  CV: no obvious cyanosis  MS: moves all visible extremities without noticeable abnormality  PSYCH/NEURO: pleasant and cooperative,  no obvious depression or anxiety, speech and thought processing grossly intact, Cognitive function grossly intact  Flowsheet Row Office Visit from 05/06/2023 in [facility]  PHQ-9 Total Score 0           07/23/2023    3:08 PM 05/06/2023    1:15 PM 02/25/2023    1:02 PM 07/18/2022    8:53 AM 07/18/2022    8:52 AM  Depression screen PHQ 2/9  Decreased Interest 0 0 0 0 0  Down, Depressed, Hopeless 0 0 0 0 0  PHQ - 2 Score 0 0 0 0 0  Altered sleeping  0     Tired, decreased energy  0     Change in appetite  0     Feeling bad or failure about yourself   0     Trouble concentrating  0     Moving slowly or fidgety/restless  0     Suicidal thoughts  0     PHQ-9 Score  0          07/18/2022    8:56 AM 02/25/2023    1:02 PM 05/06/2023    1:14 PM 07/19/2023    9:30 AM 07/23/2023    3:08 PM  Fall Risk  Falls in the past year? 0 0 0 0 0  Was there an injury with Fall? 0 0 0  0  Fall Risk Category Calculator 0 0 0  0  Fall Risk Category (Retired) Low      (RETIRED) Patient Fall Risk Level Low fall risk      Patient at Risk for Falls Due to No Fall Risks No Fall Risks No Fall Risks    Fall risk Follow up Falls prevention discussed Falls evaluation completed Falls evaluation completed  Falls evaluation completed     SUMMARY AND PLAN:  Encounter for Medicare annual wellness exam  Estrogen deficiency - Plan: DG Bone Density  Osteopenia, unspecified location - Plan: DG Bone Density   Discussed applicable health maintenance/preventive health measures and advised and referred or  ordered per patient preferences: -she had first does of shingrix about a week ago -discussed covid and flu vaccine recommendations, asked her to let us know if she gets them so that we can update -discussed bone density and recs, she had osteopenia on the last one and started taking calcium about 1 year ago and wants to check  Health Maintenance  Topic Date Due   INFLUENZA VACCINE  06/13/2023   COVID-19 Vaccine (4 - 2023-24 season) 07/14/2023   Zoster Vaccines- Shingrix (2 of 2) 08/27/2023   MAMMOGRAM  09/07/2023   Medicare Annual Wellness (AWV)  07/22/2024   DTaP/Tdap/Td (2 - Td or Tdap) 02/13/2027   Colonoscopy  09/01/2029   Pneumonia Vaccine 35+ Years old  Completed   DEXA SCAN  Completed   Hepatitis C Screening  Completed   HPV VACCINES  Aged [employer] and counseling on the following was provided based on the above review of health and a plan/checklist for the patient, along with additional information discussed, was provided for the patient in the patient instructions :  -Advised on importance of completing advanced directives, discussed options for completing and provided information in patient instructions as well -Provided counseling for reducing fall risk and safe balance exercises that can be done at home to improve balance and discussed exercise guidelines for adults with include balance exercises at least 3 days per week.  -Advised and counseled on  a healthy lifestyle - including the importance of a healthy diet, regular physical activity, social connections and stress management. -Reviewed patient's current diet. Advised and counseled on a whole foods based healthy diet. A summary of a healthy diet was provided in the Patient Instructions.  -reviewed patient's current physical activity level and discussed exercise guidelines for adults. Discussed community resources and ideas for safe exercise at home to assist in meeting exercise guideline recommendations in a safe and  healthy way.  -Advise yearly dental visits at minimum and regular eye exams   Follow up: see patient instructions     Patient Instructions  I really enjoyed getting to talk with you today! I am available on Tuesdays and Thursdays for virtual visits if you have any questions or concerns, or if I can be of any further assistance.   CHECKLIST FROM ANNUAL WELLNESS VISIT:  -Follow up (please call to schedule if not scheduled after visit):   -yearly for annual wellness visit with primary care office  Here is a list of your preventive care/health maintenance measures and the plan for each if any are due:  PLAN For any measures below that may be due:  -ordered the bone density test - please call the office if you have not been contact to schedule in the next 1-2 weeks -please get the updtated flu and covid vaccines and let us know when you do so that we can keep your record up to date  Health Maintenance  Topic Date Due   INFLUENZA VACCINE  06/13/2023   COVID-19 Vaccine (4 - 2023-24 season) 07/14/2023   Zoster Vaccines- Shingrix (2 of 2) 08/27/2023   MAMMOGRAM  09/07/2023   Medicare Annual Wellness (AWV)  07/22/2024   DTaP/Tdap/Td (2 - Td or Tdap) 02/13/2027   Colonoscopy  09/01/2029   Pneumonia Vaccine 57+ Years old  Completed   DEXA SCAN  Completed   Hepatitis C Screening  Completed   HPV VACCINES  Aged Out    -See a dentist at least yearly  -Get your eyes checked and then per your eye specialist's recommendations  -Other issues addressed today:   -I have included below further information regarding a healthy whole foods based diet, physical activity guidelines for adults, stress management and opportunities for social connections. I hope you find this information useful.    -----------------------------------------------------------------------------------------------------------------------------------------------------------------------------------------------------------------------------------------------------------  NUTRITION: -eat real food: lots of colorful vegetables (half the plate) and fruits -5-7 servings of vegetables and fruits per day (fresh or steamed is best), exp. 2 servings of vegetables with lunch and dinner and 2 servings of fruit per day. Berries and greens such as kale and collards are great choices.  -consume on a regular basis: whole grains (make sure first ingredient on label contains the word "whole"), fresh fruits, fish, nuts, seeds, healthy oils (such as olive oil, avocado oil, grape seed oil) -may eat small amounts of dairy and lean meat on occasion, but avoid processed meats such as ham, bacon, lunch meat, etc. -drink water -try to avoid fast food and pre-packaged foods, processed meat -most experts advise limiting sodium to < 2300mg  per day, should limit further is any chronic conditions such as high blood pressure, heart disease, diabetes, etc. The American Heart Association advised that < 1500mg  is is ideal -try to avoid foods that contain any ingredients with names you do not recognize  -try to avoid sugar/sweets (except for the natural sugar that occurs in fresh fruit) -try to avoid sweet drinks -try to avoid white rice,  white bread, pasta (unless whole grain), white or yellow potatoes  EXERCISE GUIDELINES FOR ADULTS: -if you wish to increase your physical activity, do so gradually and with the approval of your doctor -STOP and seek medical care immediately if you have any chest pain, chest discomfort or trouble breathing when starting or increasing exercise  -move and stretch your body, legs, feet and arms when sitting for long periods -Physical activity guidelines for optimal health in adults: -least 150 minutes per week of  aerobic exercise (can talk, but not sing) once approved by your doctor, 20-30 minutes of sustained activity or two 10 minute episodes of sustained activity every day.  -resistance training at least 2 days per week if approved by your doctor -balance exercises 3+ days per week:   Stand somewhere where you have something sturdy to hold onto if you lose balance.    1) lift up on toes, start with 5x per day and work up to 20x   2) stand and lift on leg straight out to the side so that foot is a few inches of the floor, start with 5x each side and work up to 20x each side   3) stand on one foot, start with 5 seconds each side and work up to 20 seconds on each side  If you need ideas or help with getting more active:  -Silver sneakers https://tools.silversneakers.com  -Walk with a Doc: http://www.duncan-williams.com/  -try to include resistance (weight lifting/strength building) and balance exercises twice per week: or the following link for ideas: http://castillo-powell.com/  BuyDucts.dk  STRESS MANAGEMENT: -can try meditating, or just sitting quietly with deep breathing while intentionally relaxing all parts of your body for 5 minutes daily -if you need further help with stress, anxiety or depression please follow up with your primary doctor or contact the wonderful folks at [facility]: [phone]  SOCIAL CONNECTIONS: -options in [place] if you wish to engage in more social and exercise related activities:  -Silver sneakers https://tools.silversneakers.com  -Walk with a Doc: http://www.duncan-williams.com/  -Check out the [facility] Active Adults 50+ section on the [place] [employer] (hiking clubs, book clubs, cards and games, chess, exercise classes, aquatic classes and much more) - see the website for  details: https://www.[place].gov/departments/parks-recreation/active-adults50  -YouTube has lots of exercise videos for different ages and abilities as well  -[facility] (a variety of indoor and outdoor inperson activities for adults). [phone]. [address]  -Virtual Online Classes (a variety of topics): see seniorplanet.org or call [phone]  -consider volunteering at a school, hospice center, church, senior center or elsewhere    ADVANCED HEALTHCARE DIRECTIVES:  Everyone should have advanced health care directives in place. This is so that you get the care you want, should you ever be in a situation where you are unable to make your own medical decisions.   From the [place] Advanced Directive Website: "Advance Health Care Directives are legal documents in which you give written instructions about your health care if, in the future, you cannot speak for yourself.   A health care power of attorney allows you to name a person you trust to make your health care decisions if you cannot make them yourself. A declaration of a desire for a natural death (or living will) is document, which states that you desire not to have your life prolonged by extraordinary measures if you have a terminal or incurable illness or if you are in a vegetative state. An advance instruction for mental health treatment  makes a declaration of instructions, information and preferences regarding your mental health treatment. It also states that you are aware that the advance instruction authorizes a mental health treatment provider to act according to your wishes. It may also outline your consent or refusal of mental health treatment. A declaration of an anatomical gift allows anyone over the age of 24 to make a gift by will, organ donor card or other document."   Please see the following website or an elder law attorney for forms, FAQs and for completion of advanced directives: [place]  TEFL teacher Health Care Directives Advance Health Care Directives (http://guzman.com/)  Or copy and paste the following to your web browser: [email or web address].fi         Terressa Koyanagi, DO

## 2023-07-29 ENCOUNTER — Other Ambulatory Visit: Payer: Self-pay | Admitting: Family Medicine

## 2023-07-29 DIAGNOSIS — K219 Gastro-esophageal reflux disease without esophagitis: Secondary | ICD-10-CM

## 2023-08-16 ENCOUNTER — Ambulatory Visit (HOSPITAL_BASED_OUTPATIENT_CLINIC_OR_DEPARTMENT_OTHER)
Admission: RE | Admit: 2023-08-16 | Discharge: 2023-08-16 | Disposition: A | Discharge status: Home / Self Care | Payer: Medicare Other | Source: Ambulatory Visit | Attending: Family Medicine | Admitting: Family Medicine

## 2023-08-16 DIAGNOSIS — Z1231 Encounter for screening mammogram for malignant neoplasm of breast: Secondary | ICD-10-CM | POA: Diagnosis not present

## 2023-08-30 ENCOUNTER — Other Ambulatory Visit: Payer: Self-pay | Admitting: Family Medicine

## 2023-09-02 DIAGNOSIS — Z23 Encounter for immunization: Secondary | ICD-10-CM | POA: Diagnosis not present

## 2023-09-27 ENCOUNTER — Other Ambulatory Visit: Payer: Self-pay | Admitting: Family Medicine

## 2023-09-27 DIAGNOSIS — Z8659 Personal history of other mental and behavioral disorders: Secondary | ICD-10-CM

## 2023-10-16 ENCOUNTER — Other Ambulatory Visit: Payer: Self-pay | Admitting: Family Medicine

## 2023-10-16 DIAGNOSIS — Z8659 Personal history of other mental and behavioral disorders: Secondary | ICD-10-CM

## 2023-11-15 ENCOUNTER — Other Ambulatory Visit: Payer: Self-pay | Admitting: Family Medicine

## 2023-11-15 DIAGNOSIS — K219 Gastro-esophageal reflux disease without esophagitis: Secondary | ICD-10-CM

## 2023-11-28 ENCOUNTER — Other Ambulatory Visit: Payer: Self-pay | Admitting: Family Medicine

## 2024-01-04 ENCOUNTER — Other Ambulatory Visit: Payer: Self-pay | Admitting: Family Medicine

## 2024-01-10 ENCOUNTER — Other Ambulatory Visit: Payer: Self-pay

## 2024-01-10 ENCOUNTER — Ambulatory Visit: Payer: Medicare Other | Admitting: Family Medicine

## 2024-01-10 MED ORDER — ACYCLOVIR 200 MG PO CAPS: 200.0000 mg | ORAL_CAPSULE | Freq: Every day | ORAL | 2 refills | Status: AC

## 2024-01-24 DIAGNOSIS — Z23 Encounter for immunization: Secondary | ICD-10-CM | POA: Diagnosis not present

## 2024-03-23 ENCOUNTER — Other Ambulatory Visit: Payer: Self-pay | Admitting: Family Medicine

## 2024-03-23 DIAGNOSIS — K219 Gastro-esophageal reflux disease without esophagitis: Secondary | ICD-10-CM

## 2024-04-17 ENCOUNTER — Other Ambulatory Visit: Payer: Self-pay | Admitting: Family Medicine

## 2024-04-17 DIAGNOSIS — Z8659 Personal history of other mental and behavioral disorders: Secondary | ICD-10-CM

## 2024-04-20 ENCOUNTER — Encounter: Payer: Self-pay | Admitting: Family Medicine

## 2024-04-20 ENCOUNTER — Ambulatory Visit (INDEPENDENT_AMBULATORY_CARE_PROVIDER_SITE_OTHER): Admitting: Family Medicine

## 2024-04-20 VITALS — BP 110/72 | HR 87 | Temp 97.9°F | Ht 65.0 in | Wt 153.0 lb

## 2024-04-20 DIAGNOSIS — Z8659 Personal history of other mental and behavioral disorders: Secondary | ICD-10-CM

## 2024-04-20 MED ORDER — ESCITALOPRAM OXALATE 20 MG PO TABS: ORAL_TABLET | ORAL | 3 refills | Status: AC

## 2024-04-20 NOTE — Progress Notes (Signed)
 Established Patient Office Visit   Subjective  Patient ID: [name], female    DOB: 07-01-1951  Age: 73 y.o. MRN: [id number]  Chief Complaint  Patient presents with   Medical Management of Chronic Issues    Medication     Patient is a 73 year old female seen for follow-up and med refill.  Patient states she is doing well overall.  Requesting refill on Lexapro  20 mg daily.  On medication x 20 years.  Initially started for headaches and episodes of tearfulness.  Patient notes slight irritability in the last 2 weeks but otherwise no issues.  Staying active.  Considering finding a hobby.  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    Patient Active Problem List   Diagnosis Date Noted   Acute colitis 02/16/2023   Epigastric pain 02/16/2023   Elevated LFTs 02/16/2023   Pneumobilia 02/15/2023   S/P lumbar fusion 09/13/2021   RUQ pain 05/01/2010   Diverticulosis of colon 07/26/2008   Headache 07/26/2008   DEPRESSION 07/21/2007   Past Medical History:  Diagnosis Date   ABDOMINAL PAIN 05/01/2010   DEPRESSION 07/21/2007   DIVERTICULOSIS, COLON 07/26/2008   Headache(784.0) 07/26/2008   Pityriasis 09/13/2019   Past Surgical History:  Procedure Laterality Date   COLONOSCOPY     ERCP  august 2012   ERCP N/A 02/16/2023   Procedure: ENDOSCOPIC RETROGRADE CHOLANGIOPANCREATOGRAPHY (ERCP);  Surgeon: Asencion Blacksmith, MD;  Location: [facility];  Service: Gastroenterology;  Laterality: N/A;   LAPAROSCOPIC CHOLECYSTECTOMY  08/14/2011   REMOVAL OF STONES  02/16/2023   Procedure: REMOVAL OF STONES;  Surgeon: Asencion Blacksmith, MD;  Location: [facility];  Service: Gastroenterology;;   [address]  02/16/2023   Procedure: SPHINCTEROTOMY;  Surgeon: Asencion Blacksmith, MD;  Location: [facility];  Service: Gastroenterology;;   WISDOM TOOTH EXTRACTION     Social History   Tobacco Use   Smoking status: Never   Smokeless tobacco: Never  Vaping Use   Vaping status: Never Used  Substance Use  Topics   Alcohol use: No   Drug use: No   Family History  Problem Relation Age of Onset   Diabetes Mother    Heart disease Mother    Obesity Mother    Dementia Mother    Heart disease Father    Stroke Father    Colon cancer Paternal Aunt    Other Paternal Aunt        esophageal stricture   Esophageal cancer Neg Hx    Rectal cancer Neg Hx    Stomach cancer Neg Hx    Allergies  Allergen Reactions   Aleve [Naproxen] Nausea Only and Other (See Comments)    Is to avoid this due to stomach issues   Cheese Rash and Other (See Comments)    "Yellow cheese"   Sulfamethoxazole Hives and Other (See Comments)   Tomato Rash    ROS Negative unless stated above    Objective:      BP 110/72 (BP Location: Left Arm, Patient Position: Sitting, Cuff Size: Normal)   Pulse 87   Temp 97.9 F (36.6 C) (Oral)   Ht 5\' 5"  (1.651 m)   Wt 153 lb (69.4 kg)   SpO2 96%   BMI 25.46 kg/m  BP Readings from Last 3 Encounters:  04/20/24 110/72  05/06/23 106/78  03/13/23 124/60   Wt Readings from Last 3 Encounters:  04/20/24 153 lb (69.4 kg)  07/23/23 143 lb (64.9 kg)  05/06/23 143 lb 6.4 oz (65 kg)  Physical Exam Constitutional:      General: She is not in acute distress.    Appearance: Normal appearance.  HENT:     Head: Normocephalic and atraumatic.     Nose: Nose normal.     Mouth/Throat:     Mouth: Mucous membranes are moist.  Cardiovascular:     Rate and Rhythm: Normal rate and regular rhythm.     Heart sounds: Normal heart sounds. No murmur heard.    No gallop.  Pulmonary:     Effort: Pulmonary effort is normal. No respiratory distress.     Breath sounds: Normal breath sounds. No wheezing, rhonchi or rales.  Skin:    General: Skin is warm and dry.  Neurological:     Mental Status: She is alert and oriented to person, place, and time.        04/20/2024    9:59 AM 07/23/2023    3:08 PM 05/06/2023    1:15 PM  Depression screen PHQ 2/9  Decreased Interest 0 0 0   Down, Depressed, Hopeless 0 0 0  PHQ - 2 Score 0 0 0  Altered sleeping 0  0  Tired, decreased energy 0  0  Change in appetite 0  0  Feeling bad or failure about yourself  0  0  Trouble concentrating 0  0  Moving slowly or fidgety/restless 0  0  Suicidal thoughts   0  PHQ-9 Score 0  0      04/20/2024    9:59 AM 05/06/2023    1:15 PM 10/19/2020    1:18 PM  GAD 7 : Generalized Anxiety Score  Nervous, Anxious, on Edge 0 0 0  Control/stop worrying 0 0 0  Worry too much - different things 0 0 0  Trouble relaxing 0 0 0  Restless 0 0 0  Easily annoyed or irritable 1 0 2  Afraid - awful might happen 0 0 0  Total GAD 7 Score 1 0 2  Anxiety Difficulty Not difficult at all  Not difficult at all     No results found for any visits on 04/20/24.    Assessment & Plan:   History of depression -     Escitalopram  Oxalate; TAKE 1 TABLET BY MOUTH EVERY DAY IN THE MORNING  Dispense: 90 tablet; Refill: 3  PHQ-9 score 0, GAD-7 score 1 this visit.  Patient stable on Lexapro  20 mg daily.  Continue medication.  Refills provided.  Offered labs this visit.  Patient declines at this time.  Follow-up as needed.  AWV due in September.  Return in about 3 months (around 07/22/2024) for AWV.   Viola Greulich, MD

## 2024-05-19 DIAGNOSIS — H2513 Age-related nuclear cataract, bilateral: Secondary | ICD-10-CM | POA: Diagnosis not present

## 2024-06-22 DIAGNOSIS — H2512 Age-related nuclear cataract, left eye: Secondary | ICD-10-CM | POA: Diagnosis not present

## 2024-06-22 DIAGNOSIS — H2513 Age-related nuclear cataract, bilateral: Secondary | ICD-10-CM | POA: Diagnosis not present

## 2024-06-22 DIAGNOSIS — Z01818 Encounter for other preprocedural examination: Secondary | ICD-10-CM | POA: Diagnosis not present

## 2024-06-22 DIAGNOSIS — H52222 Regular astigmatism, left eye: Secondary | ICD-10-CM | POA: Diagnosis not present

## 2024-07-17 DIAGNOSIS — F419 Anxiety disorder, unspecified: Secondary | ICD-10-CM | POA: Diagnosis not present

## 2024-07-17 DIAGNOSIS — H2513 Age-related nuclear cataract, bilateral: Secondary | ICD-10-CM | POA: Diagnosis not present

## 2024-07-17 DIAGNOSIS — H2512 Age-related nuclear cataract, left eye: Secondary | ICD-10-CM | POA: Diagnosis not present

## 2024-07-17 DIAGNOSIS — H25812 Combined forms of age-related cataract, left eye: Secondary | ICD-10-CM | POA: Diagnosis not present

## 2024-07-31 DIAGNOSIS — H2511 Age-related nuclear cataract, right eye: Secondary | ICD-10-CM | POA: Diagnosis not present

## 2024-07-31 DIAGNOSIS — H52221 Regular astigmatism, right eye: Secondary | ICD-10-CM | POA: Diagnosis not present

## 2024-07-31 DIAGNOSIS — H2513 Age-related nuclear cataract, bilateral: Secondary | ICD-10-CM | POA: Diagnosis not present

## 2024-07-31 DIAGNOSIS — F419 Anxiety disorder, unspecified: Secondary | ICD-10-CM | POA: Diagnosis not present

## 2024-07-31 DIAGNOSIS — H25811 Combined forms of age-related cataract, right eye: Secondary | ICD-10-CM | POA: Diagnosis not present

## 2024-08-09 ENCOUNTER — Other Ambulatory Visit: Payer: Self-pay | Admitting: Family Medicine

## 2024-08-09 DIAGNOSIS — K219 Gastro-esophageal reflux disease without esophagitis: Secondary | ICD-10-CM

## 2024-08-13 DIAGNOSIS — Z23 Encounter for immunization: Secondary | ICD-10-CM | POA: Diagnosis not present

## 2024-08-18 ENCOUNTER — Encounter: Payer: Self-pay | Admitting: Family Medicine

## 2024-08-18 ENCOUNTER — Ambulatory Visit: Admitting: Family Medicine

## 2024-08-18 VITALS — Wt 149.0 lb

## 2024-08-18 DIAGNOSIS — Z0000 Encounter for general adult medical examination without abnormal findings: Secondary | ICD-10-CM

## 2024-08-18 NOTE — Progress Notes (Addendum)
 PATIENT CHECK-IN and HEALTH RISK ASSESSMENT QUESTIONNAIRE:  -completed by phone/video for upcoming Medicare Preventive Visit  Pre-Visit Check-in: 1)Vitals (height, wt, BP, etc) - record in vitals section for visit on day of visit Request home vitals (wt, BP, etc.) and enter into vitals, THEN update Vital Signs SmartPhrase below at the top of the HPI. See below.  2)Review and Update Medications, Allergies PMH, Surgeries, Social history in Epic 3)Hospitalizations in the last year with date/reason? NO   4)Review and Update Care Team (patient's specialists) in Epic 5) Complete PHQ9 in Epic  6) Complete Fall Screening in Epic 7)Review all Health Maintenance Due and order if not done.  Medicare Wellness Patient Questionnaire:  Answer theses question about your habits: How often do you have a drink containing alcohol?NO  How many drinks containing alcohol do you have on a typical day when you are drinking?Na  How often do you have six or more drinks on one occasion?Na  Have you ever smoked?Yes  Quit date if applicable? 50 years ago   How many packs a day do/did you smoke? Na  Do you use smokeless tobacco?No  Do you use an illicit drugs?No  On average, how many days per week do you engage in moderate to strenuous exercise (like a brisk walk)?Yes, 2-3 days a week On average, how many minutes do you engage in exercise at this level?20 minutes, walks around the neighborhood - 1 mile several days per week Are you sexually active? No Number of partners?Na  Typical breakfast: patient doesn't have breakfast most days  Typical lunch:Sandwich, chips, grapes  Typical dinner:Chicken Vegetables  Typical snacks: yogurt, popsicle   Beverages: sweet tea, coffee  Answer theses question about your everyday activities: Can you perform most household chores?yes  Are you deaf or have significant trouble hearing?No  Do you feel that you have a problem with memory?No  Do you feel safe at home?Yes  Last  dentist visit? July 2025 8. Do you have any difficulty performing your everyday activities?no  Are you having any difficulty walking, taking medications on your own, and or difficulty managing daily home needs?no  Do you have difficulty walking or climbing stairs?no  Do you have difficulty dressing or bathing?no  Do you have difficulty doing errands alone such as visiting a doctor's office or shopping?no  Do you currently have any difficulty preparing food and eating?no  Do you currently have any difficulty using the toilet?no  Do you have any difficulty managing your finances?no  Do you have any difficulties with housekeeping of managing your housekeeping?no    Do you have Advanced Directives in place (Living Will, Healthcare Power or Attorney)? No    Last eye Exam and location?a week ago, [facility] Maree    Do you currently use prescribed or non-prescribed narcotic or opioid pain medications?no   Do you have a history or close family history of breast, ovarian, tubal or peritoneal cancer or a family member with BRCA (breast cancer susceptibility 1 and 2) gene mutations?No   Nurse/Assistant Credentials/time stamp: Leah A.Wright CMA 10:38am    ----------------------------------------------------------------------------------------------------------------------------------------------------------------------------------------------------------------------  Because this visit was a virtual/telehealth visit, some criteria may be missing or patient reported. Any vitals not documented were not able to be obtained and vitals that have been documented are patient reported.    MEDICARE ANNUAL PREVENTIVE CARE VISIT WITH PROVIDER (Welcome to Medicare, initial annual wellness or annual wellness exam)  Virtual Visit via Video Note  I connected with [name] on 08/18/24  by a video enabled telemedicine application and verified that I am speaking with the correct person using two  identifiers.  Location patient: home Location provider:work or home office Persons participating in the virtual visit: patient, provider  Concerns and/or follow up today: everything is great!   See HM section in Epic for other details of completed HM.    ROS: negative for report of fevers, unintentional weight loss, vision changes, vision loss, hearing loss or change, chest pain, sob, hemoptysis, melena, hematochezia, hematuria, falls, bleeding or bruising, thoughts of suicide or self harm, memory loss  Patient-completed extensive health risk assessment - reviewed and discussed with the patient: See Health Risk Assessment completed with patient prior to the visit either above or in recent phone note. This was reviewed in detailed with the patient today and appropriate recommendations, orders and referrals were placed as needed per Summary below and patient instructions.   Review of Medical History: -PMH, PSH, Family History and current specialty and care providers reviewed and updated and listed below   Patient Care Team: Mercer Clotilda SAUNDERS, MD as PCP - General (Family Medicine)   Past Medical History:  Diagnosis Date   ABDOMINAL PAIN 05/01/2010   DEPRESSION 07/21/2007   DIVERTICULOSIS, COLON 07/26/2008   Headache(784.0) 07/26/2008   Pityriasis 09/13/2019    Past Surgical History:  Procedure Laterality Date   COLONOSCOPY     ERCP  august 2012   ERCP N/A 02/16/2023   Procedure: ENDOSCOPIC RETROGRADE CHOLANGIOPANCREATOGRAPHY (ERCP);  Surgeon: Aneita Gwendlyn DASEN, MD;  Location: [facility];  Service: Gastroenterology;  Laterality: N/A;   LAPAROSCOPIC CHOLECYSTECTOMY  08/14/2011   REMOVAL OF STONES  02/16/2023   Procedure: REMOVAL OF STONES;  Surgeon: Aneita Gwendlyn DASEN, MD;  Location: [facility];  Service: Gastroenterology;;   ANNETT  02/16/2023   Procedure: SPHINCTEROTOMY;  Surgeon: Aneita Gwendlyn DASEN, MD;  Location: [facility];  Service: Gastroenterology;;   WISDOM TOOTH  EXTRACTION      Social History   Socioeconomic History   Marital status: Married    Spouse name: Not on file   Number of children: Not on file   Years of education: Not on file   Highest education level: Associate degree: academic program  Occupational History   Not on file  Tobacco Use   Smoking status: Never   Smokeless tobacco: Never  Vaping Use   Vaping status: Never Used  Substance and Sexual Activity   Alcohol use: No   Drug use: No   Sexual activity: Not on file  Other Topics Concern   Not on file  Social History Narrative   Not on file   Social Drivers of Health   Financial Resource Strain: Low Risk  (08/17/2024)   Overall Financial Resource Strain (CARDIA)    Difficulty of Paying Living Expenses: Not hard at all  Food Insecurity: No Food Insecurity (08/17/2024)   Hunger Vital Sign    Worried About Running Out of Food in the Last Year: Never true    Ran Out of Food in the Last Year: Never true  Transportation Needs: No Transportation Needs (08/17/2024)   PRAPARE - Administrator, Civil Service (Medical): No    Lack of Transportation (Non-Medical): No  Physical Activity: Insufficiently Active (08/17/2024)   Exercise Vital Sign    Days of Exercise per Week: 2 days    Minutes of Exercise per Session: 30 min  Stress: No Stress Concern Present (08/17/2024)   [employer] of Occupational Health - Occupational Stress Questionnaire  Feeling of Stress: Only a little  Social Connections: Moderately Integrated (08/17/2024)   Social Connection and Isolation Panel    Frequency of Communication with Friends and Family: Once a week    Frequency of Social Gatherings with Friends and Family: Once a week    Attends Religious Services: More than 4 times per year    Active Member of [employer] or Organizations: Yes    Attends Engineer, structural: More than 4 times per year    Marital Status: Married  Catering manager Violence: Not At Risk (02/16/2023)    Humiliation, Afraid, Rape, and Kick questionnaire    Fear of Current or Ex-Partner: No    Emotionally Abused: No    Physically Abused: No    Sexually Abused: No    Family History  Problem Relation Age of Onset   Diabetes Mother    Heart disease Mother    Obesity Mother    Dementia Mother    Heart disease Father    Stroke Father    Colon cancer Paternal Aunt    Other Paternal Aunt        esophageal stricture   Esophageal cancer Neg Hx    Rectal cancer Neg Hx    Stomach cancer Neg Hx     Current Outpatient Medications on File Prior to Visit  Medication Sig Dispense Refill   acyclovir  (ZOVIRAX ) 200 MG capsule Take 1 capsule (200 mg total) by mouth daily. 90 capsule 2   Calcium Citrate-Vitamin D (CALCIUM CITRATE + D3 PO) Take 1 capsule by mouth daily.     COLLAGEN PO Take 2 Scoops by mouth See admin instructions. Mix 2 scoopsful of powder into a cup of coffee and drink once a day     escitalopram  (LEXAPRO ) 20 MG tablet TAKE 1 TABLET BY MOUTH EVERY DAY IN THE MORNING 90 tablet 3   Multiple Vitamin (MULTIVITAMIN) capsule Take 1 capsule by mouth daily with breakfast.     omeprazole  (PRILOSEC) 20 MG capsule TAKE 1 CAPSULE BY MOUTH EVERY DAY 30 capsule 3   TURMERIC PO Take 2 tablets by mouth See admin instructions. Chew 2 gummies by mouth once daily     No current facility-administered medications on file prior to visit.    Allergies  Allergen Reactions   Aleve [Naproxen] Nausea Only and Other (See Comments)    Is to avoid this due to stomach issues   Cheese Rash and Other (See Comments)    Yellow cheese   Sulfamethoxazole Hives and Other (See Comments)   Tomato Rash       Physical Exam Vitals requested from patient and listed below if patient had equipment and was able to obtain at home for this virtual visit: There were no vitals filed for this visit. Estimated body mass index is 24.79 kg/m as calculated from the following:   Height as of 04/20/24: 5' 5 (1.651 m).    Weight as of this encounter: 149 lb (67.6 kg).  EKG (optional): deferred due to virtual visit  GENERAL: alert, oriented, no acute distress detected; full vision exam deferred due to pandemic and/or virtual encounter  HEENT: atraumatic, conjunttiva clear, no obvious abnormalities on inspection of external nose and ears  NECK: normal movements of the head and neck  LUNGS: on inspection no signs of respiratory distress, breathing rate appears normal, no obvious gross SOB, gasping or wheezing  CV: no obvious cyanosis  MS: moves all visible extremities without noticeable abnormality  PSYCH/NEURO: pleasant and cooperative, no obvious  depression or anxiety, speech and thought processing grossly intact, Cognitive function grossly intact  Flowsheet Row Clinical Support from 08/18/2024 in [facility] [place]  PHQ-9 Total Score 0        08/18/2024   10:26 AM 04/20/2024    9:59 AM 07/23/2023    3:08 PM 05/06/2023    1:15 PM 02/25/2023    1:02 PM  Depression screen PHQ 2/9  Decreased Interest 0 0 0 0 0  Down, Depressed, Hopeless 0 0 0 0 0  PHQ - 2 Score 0 0 0 0 0  Altered sleeping 0 0  0   Tired, decreased energy 0 0  0   Change in appetite 0 0  0   Feeling bad or failure about yourself  0 0  0   Trouble concentrating 0 0  0   Moving slowly or fidgety/restless 0 0  0   Suicidal thoughts 0   0   PHQ-9 Score 0 0  0        05/06/2023    1:14 PM 07/19/2023    9:30 AM 07/23/2023    3:08 PM 04/20/2024    9:59 AM 08/17/2024    7:02 PM  Fall Risk  Falls in the past year? 0 0 0 0 0  Was there an injury with Fall? 0  0 0   Fall Risk Category Calculator 0  0 0   Patient at Risk for Falls Due to No Fall Risks   No Fall Risks   Fall risk Follow up Falls evaluation completed  Falls evaluation completed Falls evaluation completed      SUMMARY AND PLAN:  Encounter for Medicare annual wellness exam   Discussed applicable health maintenance/preventive health measures and  advised and referred or ordered per patient preferences: -bone density: pt declined, discussed adequate vit d and calcium and bone building supplement -she is considering covid vaccine Health Maintenance  Topic Date Due   COVID-19 Vaccine (4 - 2025-26 season) 09/03/2024 (Originally 07/13/2024)   Mammogram  08/15/2025   Medicare Annual Wellness (AWV)  08/18/2025   DTaP/Tdap/Td (2 - Td or Tdap) 02/13/2027   Colonoscopy  09/01/2029   Pneumococcal Vaccine: 50+ Years  Completed   Influenza Vaccine  Completed   DEXA SCAN  Completed   Hepatitis C Screening  Completed   Zoster Vaccines- Shingrix  Completed   Meningococcal B Vaccine  Aged Out     Education and counseling on the following was provided based on the above review of health and a plan/checklist for the patient, along with additional information discussed, was provided for the patient in the patient instructions :  -Advised on importance of completing advanced directives, discussed options for completing and provided information in patient instructions as well -Advised and counseled on a healthy lifestyle -Reviewed patient's current diet. Advised and counseled on a whole foods based healthy diet. A summary of a healthy diet was provided in the Patient Instructions.  -reviewed patient's current physical activity level and discussed exercise guidelines for adults. Discussed ideas for safe exercise at home to assist in meeting exercise guideline recommendations in a safe and healthy way. Also discussed bone building exercise.  -Advise yearly dental visits at minimum and regular eye exams   Follow up: see patient instructions   Patient Instructions  I really enjoyed getting to talk with you today! I am available on Tuesdays and Thursdays for virtual visits if you have any questions or concerns, or if I can be  of any further assistance.   CHECKLIST FROM ANNUAL WELLNESS VISIT:  -Follow up (please call to schedule if not scheduled after  visit):   -yearly for annual wellness visit with primary care office  Here is a list of your preventive care/health maintenance measures and the plan for each if any are due:  PLAN For any measures below that may be due:    1. Please let us  know if you change you mind on getting the bone density exam.   2. Can get the covid vaccine at the pharmacy. Please let us  know if you do so that we can update your record.   3. Mammogram yearly.   Health Maintenance  Topic Date Due   Medicare Annual Wellness (AWV)  07/22/2024   COVID-19 Vaccine (4 - 2025-26 season) 09/03/2024 (Originally 07/13/2024)   Mammogram  08/15/2025   DTaP/Tdap/Td (2 - Td or Tdap) 02/13/2027   Colonoscopy  09/01/2029   Pneumococcal Vaccine: 50+ Years  Completed   Influenza Vaccine  Completed   DEXA SCAN  Completed   Hepatitis C Screening  Completed   Zoster Vaccines- Shingrix  Completed   Meningococcal B Vaccine  Aged Out    -See a dentist at least yearly  -Get your eyes checked and then per your eye specialist's recommendations  -Other issues addressed today:   -I have included below further information regarding a healthy whole foods based diet, physical activity guidelines for adults, stress management and opportunities for social connections. I hope you find this information useful.   -----------------------------------------------------------------------------------------------------------------------------------------------------------------------------------------------------------------------------------------------------------    NUTRITION: -eat real food: lots of colorful vegetables (half the plate) and fruits -5-7 servings of vegetables and fruits per day (fresh or steamed is best), exp. 2 servings of vegetables with lunch and dinner and 2 servings of fruit per day. Berries and greens such as kale and collards are great choices.  -consume on a regular basis:  fresh fruits, fresh veggies, fish, nuts,  seeds, healthy oils (such as olive oil, avocado oil), whole grains (make sure for bread/pasta/crackers/etc., that the first ingredient on label contains the word whole), legumes. -can eat small amounts of dairy and lean meat (no larger than the palm of your hand), but avoid processed meats such as ham, bacon, lunch meat, etc. -drink water -try to avoid fast food and pre-packaged foods, processed meat, ultra processed foods/beverages (donuts, candy, etc.) -most experts advise limiting sodium to < 2300mg  per day, should limit further is any chronic conditions such as high blood pressure, heart disease, diabetes, etc. The American Heart Association advised that < 1500mg  is is ideal -try to avoid foods/beverages that contain any ingredients with names you do not recognize  -try to avoid foods/beverages  with added sugar or sweeteners/sweets  -try to avoid sweet drinks (including diet drinks): soda, juice, Gatorade, sweet tea, power drinks, diet drinks -try to avoid white rice, white bread, pasta (unless whole grain)  EXERCISE GUIDELINES FOR ADULTS: -if you wish to increase your physical activity, do so gradually and with the approval of your doctor -STOP and seek medical care immediately if you have any chest pain, chest discomfort or trouble breathing when starting or increasing exercise  -move and stretch your body, legs, feet and arms when sitting for long periods -Physical activity guidelines for optimal health in adults: -get at least 150 minutes per week of moderate exercise (can talk, but not sing); this is about 20-30 minutes of sustained activity 5-7 days per week or two 10-15 minute episodes of sustained  activity 5-7 days per week -do some muscle building/resistance training/strength training at least 2 days per week  -balance exercises 3+ days per week:   Stand somewhere where you have something sturdy to hold onto if you lose balance    1) lift up on toes, then back down, start with 5x  per day and work up to 20x   2) stand and lift one leg straight out to the side so that foot is a few inches of the floor, start with 5x each side and work up to 20x each side   3) stand on one foot, start with 5 seconds each side and work up to 20 seconds on each side  If you need ideas or help with getting more active:  -Silver sneakers https://tools.silversneakers.com  -Walk with a Doc: http://www.duncan-williams.com/  -try to include resistance (weight lifting/strength building) and balance exercises twice per week: or the following link for ideas: http://castillo-powell.com/  BuyDucts.dk  STRESS MANAGEMENT: -can try meditating, or just sitting quietly with deep breathing while intentionally relaxing all parts of your body for 5 minutes daily -if you need further help with stress, anxiety or depression please follow up with your primary doctor or contact the wonderful folks at [facility]: [phone]  SOCIAL CONNECTIONS: -options in [place] if you wish to engage in more social and exercise related activities:  -Silver sneakers https://tools.silversneakers.com  -Walk with a Doc: http://www.duncan-williams.com/  -Check out the [facility] Active Adults 50+ section on the [place] of [employer] (hiking clubs, book clubs, cards and games, chess, exercise classes, aquatic classes and much more) - see the website for details: https://www.[place].gov/departments/parks-recreation/active-adults50  -YouTube has lots of exercise videos for different ages and abilities as well  -[facility] (a variety of indoor and outdoor inperson activities for adults). (408)718-5448. [address]  -Virtual Online Classes (a variety of topics): see seniorplanet.org or call [phone]  -consider volunteering at a school, hospice center, church, senior center or  elsewhere            Chiquita JONELLE Cramp, DO

## 2024-08-18 NOTE — Progress Notes (Signed)
 Patient was unable to self-report due to a lack of equipment at home via telehealth

## 2024-08-18 NOTE — Patient Instructions (Signed)
 I really enjoyed getting to talk with you today! I am available on Tuesdays and Thursdays for virtual visits if you have any questions or concerns, or if I can be of any further assistance.   CHECKLIST FROM ANNUAL WELLNESS VISIT:  -Follow up (please call to schedule if not scheduled after visit):   -yearly for annual wellness visit with primary care office  Here is a list of your preventive care/health maintenance measures and the plan for each if any are due:  PLAN For any measures below that may be due:    1. Please let us  know if you change you mind on getting the bone density exam.   2. Can get the covid vaccine at the pharmacy. Please let us  know if you do so that we can update your record.   3. Mammogram yearly.   Health Maintenance  Topic Date Due   Medicare Annual Wellness (AWV)  07/22/2024   COVID-19 Vaccine (4 - 2025-26 season) 09/03/2024 (Originally 07/13/2024)   Mammogram  08/15/2025   DTaP/Tdap/Td (2 - Td or Tdap) 02/13/2027   Colonoscopy  09/01/2029   Pneumococcal Vaccine: 50+ Years  Completed   Influenza Vaccine  Completed   DEXA SCAN  Completed   Hepatitis C Screening  Completed   Zoster Vaccines- Shingrix  Completed   Meningococcal B Vaccine  Aged Out    -See a dentist at least yearly  -Get your eyes checked and then per your eye specialist's recommendations  -Other issues addressed today:   -I have included below further information regarding a healthy whole foods based diet, physical activity guidelines for adults, stress management and opportunities for social connections. I hope you find this information useful.   -----------------------------------------------------------------------------------------------------------------------------------------------------------------------------------------------------------------------------------------------------------    NUTRITION: -eat real food: lots of colorful vegetables (half the plate) and fruits -5-7  servings of vegetables and fruits per day (fresh or steamed is best), exp. 2 servings of vegetables with lunch and dinner and 2 servings of fruit per day. Berries and greens such as kale and collards are great choices.  -consume on a regular basis:  fresh fruits, fresh veggies, fish, nuts, seeds, healthy oils (such as olive oil, avocado oil), whole grains (make sure for bread/pasta/crackers/etc., that the first ingredient on label contains the word whole), legumes. -can eat small amounts of dairy and lean meat (no larger than the palm of your hand), but avoid processed meats such as ham, bacon, lunch meat, etc. -drink water -try to avoid fast food and pre-packaged foods, processed meat, ultra processed foods/beverages (donuts, candy, etc.) -most experts advise limiting sodium to < 2300mg  per day, should limit further is any chronic conditions such as high blood pressure, heart disease, diabetes, etc. The American Heart Association advised that < 1500mg  is is ideal -try to avoid foods/beverages that contain any ingredients with names you do not recognize  -try to avoid foods/beverages  with added sugar or sweeteners/sweets  -try to avoid sweet drinks (including diet drinks): soda, juice, Gatorade, sweet tea, power drinks, diet drinks -try to avoid white rice, white bread, pasta (unless whole grain)  EXERCISE GUIDELINES FOR ADULTS: -if you wish to increase your physical activity, do so gradually and with the approval of your doctor -STOP and seek medical care immediately if you have any chest pain, chest discomfort or trouble breathing when starting or increasing exercise  -move and stretch your body, legs, feet and arms when sitting for long periods -Physical activity guidelines for optimal health in adults: -get at least 150  minutes per week of moderate exercise (can talk, but not sing); this is about 20-30 minutes of sustained activity 5-7 days per week or two 10-15 minute episodes of sustained  activity 5-7 days per week -do some muscle building/resistance training/strength training at least 2 days per week  -balance exercises 3+ days per week:   Stand somewhere where you have something sturdy to hold onto if you lose balance    1) lift up on toes, then back down, start with 5x per day and work up to 20x   2) stand and lift one leg straight out to the side so that foot is a few inches of the floor, start with 5x each side and work up to 20x each side   3) stand on one foot, start with 5 seconds each side and work up to 20 seconds on each side  If you need ideas or help with getting more active:  -Silver sneakers https://tools.silversneakers.com  -Walk with a Doc: http://www.duncan-williams.com/  -try to include resistance (weight lifting/strength building) and balance exercises twice per week: or the following link for ideas: http://castillo-powell.com/  BuyDucts.dk  STRESS MANAGEMENT: -can try meditating, or just sitting quietly with deep breathing while intentionally relaxing all parts of your body for 5 minutes daily -if you need further help with stress, anxiety or depression please follow up with your primary doctor or contact the wonderful folks at [facility]: [phone]  SOCIAL CONNECTIONS: -options in [place] if you wish to engage in more social and exercise related activities:  -Silver sneakers https://tools.silversneakers.com  -Walk with a Doc: http://www.duncan-williams.com/  -Check out the [facility] Active Adults 50+ section on the [place] [employer] (hiking clubs, book clubs, cards and games, chess, exercise classes, aquatic classes and much more) - see the website for details: https://www.[place].gov/departments/parks-recreation/active-adults50  -YouTube has lots of exercise videos for different ages and abilities as well  -[facility] (a variety of indoor and outdoor inperson activities for adults). [phone]. [address]  -Virtual Online Classes (a variety of topics): see seniorplanet.org or call [phone]) [phone]  -consider volunteering at a school, hospice center, church, senior center or elsewhere

## 2024-10-19 ENCOUNTER — Encounter: Admitting: Family Medicine

## 2024-10-23 ENCOUNTER — Ambulatory Visit: Admitting: Family Medicine

## 2024-10-23 ENCOUNTER — Encounter: Payer: Self-pay | Admitting: Family Medicine

## 2024-10-23 VITALS — BP 118/82 | HR 80 | Temp 98.9°F | Ht 65.0 in | Wt 152.0 lb

## 2024-10-23 DIAGNOSIS — K222 Esophageal obstruction: Secondary | ICD-10-CM | POA: Diagnosis not present

## 2024-10-23 DIAGNOSIS — Z8659 Personal history of other mental and behavioral disorders: Secondary | ICD-10-CM

## 2024-10-23 DIAGNOSIS — M85852 Other specified disorders of bone density and structure, left thigh: Secondary | ICD-10-CM | POA: Diagnosis not present

## 2024-10-23 DIAGNOSIS — G5139 Clonic hemifacial spasm, unspecified: Secondary | ICD-10-CM | POA: Diagnosis not present

## 2024-10-23 DIAGNOSIS — K219 Gastro-esophageal reflux disease without esophagitis: Secondary | ICD-10-CM | POA: Diagnosis not present

## 2024-10-23 DIAGNOSIS — M8580 Other specified disorders of bone density and structure, unspecified site: Secondary | ICD-10-CM

## 2024-10-23 DIAGNOSIS — Z0000 Encounter for general adult medical examination without abnormal findings: Secondary | ICD-10-CM

## 2024-10-23 LAB — COMPREHENSIVE METABOLIC PANEL WITH GFR
ALT: 11 U/L (ref 0–35)
AST: 17 U/L (ref 0–37)
Albumin: 4.7 g/dL (ref 3.5–5.2)
Alkaline Phosphatase: 76 U/L (ref 39–117)
BUN: 15 mg/dL (ref 6–23)
CO2: 30 meq/L (ref 19–32)
Calcium: 10 mg/dL (ref 8.4–10.5)
Chloride: 104 meq/L (ref 96–112)
Creatinine, Ser: 0.72 mg/dL (ref 0.40–1.20)
GFR: 83.56 mL/min (ref >60.00)
Glucose, Bld: 95 mg/dL (ref 70–99)
Potassium: 4 meq/L (ref 3.5–5.1)
Sodium: 142 meq/L (ref 135–145)
Total Bilirubin: 1.2 mg/dL (ref 0.2–1.2)
Total Protein: 6.9 g/dL (ref 6.0–8.3)

## 2024-10-23 LAB — CBC WITH DIFFERENTIAL/PLATELET
Basophils Absolute: 0 K/uL (ref 0.0–0.1)
Basophils Relative: 0.9 % (ref 0.0–3.0)
Eosinophils Absolute: 0.2 K/uL (ref 0.0–0.7)
Eosinophils Relative: 3.3 % (ref 0.0–5.0)
HCT: 40.7 % (ref 36.0–46.0)
Hemoglobin: 14.3 g/dL (ref 12.0–15.0)
Lymphocytes Relative: 34.6 % (ref 12.0–46.0)
Lymphs Abs: 1.8 K/uL (ref 0.7–4.0)
MCHC: 35.2 g/dL (ref 30.0–36.0)
MCV: 88.6 fl (ref 78.0–100.0)
Monocytes Absolute: 0.4 K/uL (ref 0.1–1.0)
Monocytes Relative: 7.7 % (ref 3.0–12.0)
Neutro Abs: 2.7 K/uL (ref 1.4–7.7)
Neutrophils Relative %: 53.5 % (ref 43.0–77.0)
Platelets: 247 K/uL (ref 150.0–400.0)
RBC: 4.6 Mil/uL (ref 3.87–5.11)
RDW: 12.8 % (ref 11.5–15.5)
WBC: 5.1 K/uL (ref 4.0–10.5)

## 2024-10-23 LAB — VITAMIN D 25 HYDROXY (VIT D DEFICIENCY, FRACTURES): VITD: 52.45 ng/mL (ref 30.00–100.00)

## 2024-10-23 NOTE — Addendum Note (Signed)
 Addended by: MERCER CLOTILDA SAUNDERS on: 10/23/2024 02:48 PM   Modules accepted: Orders

## 2024-10-23 NOTE — Addendum Note (Signed)
 Addended by: MERCER KIRSCH R on: 10/23/2024 02:49 PM   Modules accepted: Level of Service

## 2024-10-23 NOTE — Progress Notes (Addendum)
 Established Patient Office Visit   Subjective  Patient ID: [name], female    DOB: 1951/08/01  Age: 73 y.o. MRN: [id number]  Chief Complaint  Patient presents with   Medical Management of Chronic Issues    Patient is a 73 year old female seen for CPE.  Had AWV on 08/18/2024.  Doing well overall.  Doing chair exercises with her husband.  Made changes to diet which has helped GI symptoms.  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  Last bone density scan 01/12/2020.  Patient taking calcium and vitamin D supplement.  Recently had cataracts removed.  Only having to use readers to see up close.    Patient Active Problem List   Diagnosis Date Noted   Acute colitis 02/16/2023   Epigastric pain 02/16/2023   Elevated LFTs 02/16/2023   Pneumobilia 02/15/2023   S/P lumbar fusion 09/13/2021   RUQ pain 05/01/2010   Diverticulosis of colon 07/26/2008   Headache 07/26/2008   DEPRESSION 07/21/2007   Past Medical History:  Diagnosis Date   ABDOMINAL PAIN 05/01/2010   Allergy    DEPRESSION 07/21/2007   DIVERTICULOSIS, COLON 07/26/2008   GERD (gastroesophageal reflux disease)    Headache(784.0) 07/26/2008   Pityriasis 09/13/2019   Past Surgical History:  Procedure Laterality Date   COLONOSCOPY     ERCP  06/2011   ERCP N/A 02/16/2023   Procedure: ENDOSCOPIC RETROGRADE CHOLANGIOPANCREATOGRAPHY (ERCP);  Surgeon: Aneita Gwendlyn DASEN, MD;  Location: [facility];  Service: Gastroenterology;  Laterality: N/A;   EYE SURGERY  07/16/2024   Cataracts   LAPAROSCOPIC CHOLECYSTECTOMY  08/14/2011   REMOVAL OF STONES  02/16/2023   Procedure: REMOVAL OF STONES;  Surgeon: Aneita Gwendlyn DASEN, MD;  Location: [facility];  Service: Gastroenterology;;   ANNETT  02/16/2023   Procedure: SPHINCTEROTOMY;  Surgeon: Aneita Gwendlyn DASEN, MD;  Location: [facility];  Service: Gastroenterology;;   SPINE SURGERY  09/13/2021   L-4-5   WISDOM TOOTH EXTRACTION     Social History[1] Family History   Problem Relation Age of Onset   Diabetes Mother    Heart disease Mother    Obesity Mother    Dementia Mother    Hypertension Mother    Heart disease Father    Stroke Father    Colon cancer Paternal Aunt    Other Paternal Aunt        esophageal stricture   Hypertension Brother    Hypertension Brother    Obesity Brother    Esophageal cancer Neg Hx    Rectal cancer Neg Hx    Stomach cancer Neg Hx    Allergies[2]  ROS Negative unless stated above    Objective:     BP 118/82 (BP Location: Left Arm, Patient Position: Sitting, Cuff Size: Large)   Pulse 80   Temp 98.9 F (37.2 C) (Oral)   Ht 5' 5 (1.651 m)   Wt 152 lb (68.9 kg)   SpO2 98%   BMI 25.29 kg/m  BP Readings from Last 3 Encounters:  10/23/24 118/82  04/20/24 110/72  05/06/23 106/78   Wt Readings from Last 3 Encounters:  10/23/24 152 lb (68.9 kg)  08/18/24 149 lb (67.6 kg)  04/20/24 153 lb (69.4 kg)      Physical Exam Constitutional:      Appearance: Normal appearance.  HENT:     Head: Normocephalic and atraumatic.     Right Ear: Tympanic membrane, ear canal and external ear normal.     Left Ear: Tympanic membrane, ear  canal and external ear normal.     Nose: Nose normal.     Mouth/Throat:     Mouth: Mucous membranes are moist.     Pharynx: No oropharyngeal exudate or posterior oropharyngeal erythema.  Eyes:     General: No scleral icterus.    Extraocular Movements: Extraocular movements intact.     Conjunctiva/sclera: Conjunctivae normal.     Pupils: Pupils are equal, round, and reactive to light.  Neck:     Thyroid: No thyromegaly.     Vascular: No carotid bruit.  Cardiovascular:     Rate and Rhythm: Normal rate and regular rhythm.     Pulses: Normal pulses.     Heart sounds: Normal heart sounds. No murmur heard.    No friction rub.  Pulmonary:     Effort: Pulmonary effort is normal.     Breath sounds: Normal breath sounds. No wheezing, rhonchi or rales.  Abdominal:     General: Bowel  sounds are normal.     Palpations: Abdomen is soft.     Tenderness: There is no abdominal tenderness.  Musculoskeletal:        General: No deformity. Normal range of motion.  Lymphadenopathy:     Cervical: No cervical adenopathy.  Skin:    General: Skin is warm and dry.     Findings: No lesion.  Neurological:     General: No focal deficit present.     Mental Status: She is alert and oriented to person, place, and time.     Comments: Mild intermittent facial spasm.  Psychiatric:        Mood and Affect: Mood normal.        Thought Content: Thought content normal.        08/18/2024   10:26 AM 04/20/2024    9:59 AM 07/23/2023    3:08 PM  Depression screen PHQ 2/9  Decreased Interest 0 0 0  Down, Depressed, Hopeless 0 0 0  PHQ - 2 Score 0 0 0  Altered sleeping 0 0   Tired, decreased energy 0 0   Change in appetite 0 0   Feeling bad or failure about yourself  0 0   Trouble concentrating 0 0   Moving slowly or fidgety/restless 0 0   Suicidal thoughts 0    PHQ-9 Score 0  0       Data saved with a previous flowsheet row definition      08/18/2024   10:27 AM 04/20/2024    9:59 AM 05/06/2023    1:15 PM 10/19/2020    1:18 PM  GAD 7 : Generalized Anxiety Score  Nervous, Anxious, on Edge 0 0 0 0  Control/stop worrying 0 0 0 0  Worry too much - different things 0 0 0 0  Trouble relaxing 0 0 0 0  Restless 0 0 0 0  Easily annoyed or irritable 0 1 0 2  Afraid - awful might happen 0 0 0 0  Total GAD 7 Score 0 1 0 2  Anxiety Difficulty  Not difficult at all  Not difficult at all     No results found for any visits on 10/23/24.    Assessment & Plan:   Well adult exam -     Lipid panel; Future -     Hemoglobin A1c; Future  Osteopenia of neck of left femur -     VITAMIN D 25 Hydroxy (Vit-D Deficiency, Fractures); Future -     DG Bone Density; Future  History  of depression  GERD with stricture -     CBC with Differential/Platelet; Future -     Comprehensive metabolic panel  with GFR; Future -     Vitamin B12; Future  Facial spasm -     Comprehensive metabolic panel with GFR; Future  Age-appropriate health screenings discussed.  Obtain labs.  Immunizations reviewed.  Mammogram scheduled.  Colonoscopy done 09/02/2019.  Eye exam done 05/19/2024. Bone density scan ordered to follow-up on osteopenia as last done 01/12/2020.  Continue vitamin D and calcium supplement along with weightbearing exercises.  PHQ-9 and GAD-7 score 0 this visit.  Continue Lexapro  20 mg daily.  Continue self-care.  History of GERD with stricture.  Continue diet changes.  PPI as needed.  Pt spasm noted on exam.  Patient without complaint of.  Labs to evaluate electrolytes.  Replete if needed.   Return if symptoms worsen or fail to improve.  Next AWV 08/2025.  Clotilda JONELLE Single, MD     [1]  Social History Tobacco Use   Smoking status: Never   Smokeless tobacco: Never  Vaping Use   Vaping status: Never Used  Substance Use Topics   Alcohol use: Never   Drug use: Never  [2]  Allergies Allergen Reactions   Aleve [Naproxen] Nausea Only and Other (See Comments)    Is to avoid this due to stomach issues   Cheese Rash and Other (See Comments)    Yellow cheese   Sulfamethoxazole Hives and Other (See Comments)   Tomato Rash

## 2024-12-11 ENCOUNTER — Ambulatory Visit: Payer: Self-pay | Admitting: Family Medicine

## 2024-12-13 ENCOUNTER — Other Ambulatory Visit: Payer: Self-pay | Admitting: Family Medicine

## 2024-12-13 DIAGNOSIS — K219 Gastro-esophageal reflux disease without esophagitis: Secondary | ICD-10-CM

## 2025-01-18 ENCOUNTER — Encounter (HOSPITAL_BASED_OUTPATIENT_CLINIC_OR_DEPARTMENT_OTHER): Admitting: Radiology

## 2025-01-18 DIAGNOSIS — Z1231 Encounter for screening mammogram for malignant neoplasm of breast: Secondary | ICD-10-CM
